# Patient Record
Sex: Male | Born: 1970 | Race: White | Hispanic: No | Marital: Married | Smoking: Never smoker
Health system: Southern US, Community
[De-identification: ages and names within clinical notes are randomized; demographics above are authoritative.]

## PROBLEM LIST (undated history)

## (undated) DIAGNOSIS — S060XAA Concussion with loss of consciousness status unknown, initial encounter: Secondary | ICD-10-CM

## (undated) DIAGNOSIS — E291 Testicular hypofunction: Secondary | ICD-10-CM

## (undated) DIAGNOSIS — Z789 Other specified health status: Secondary | ICD-10-CM

## (undated) DIAGNOSIS — R519 Headache, unspecified: Secondary | ICD-10-CM

## (undated) DIAGNOSIS — N400 Enlarged prostate without lower urinary tract symptoms: Secondary | ICD-10-CM

---

## 2019-12-04 ENCOUNTER — Encounter: Payer: Self-pay | Admitting: Dermatology

## 2019-12-04 ENCOUNTER — Other Ambulatory Visit: Payer: Self-pay

## 2019-12-04 ENCOUNTER — Ambulatory Visit: Payer: BC Managed Care – PPO | Admitting: Dermatology

## 2019-12-04 ENCOUNTER — Encounter (INDEPENDENT_AMBULATORY_CARE_PROVIDER_SITE_OTHER): Payer: Self-pay

## 2019-12-04 DIAGNOSIS — D229 Melanocytic nevi, unspecified: Secondary | ICD-10-CM

## 2019-12-04 DIAGNOSIS — D225 Melanocytic nevi of trunk: Secondary | ICD-10-CM

## 2019-12-04 DIAGNOSIS — D485 Neoplasm of uncertain behavior of skin: Secondary | ICD-10-CM | POA: Diagnosis not present

## 2019-12-04 NOTE — Progress Notes (Signed)
   Follow-Up Visit   Subjective  Christopher Pugh is a 49 y.o. male who presents for the following: Skin Problem (right shoulder scale).  Growth Location: Right upper back Duration: Avril months Quality:  Associated Signs/Symptoms: Modifying Factors:  Severity:  Timing: Context:   The following portions of the chart were reviewed this encounter and updated as appropriate: Tobacco  Allergies  Meds  Problems  Med Hx  Surg Hx  Fam Hx      Objective  Well appearing patient in no apparent distress; mood and affect are within normal limits.  All skin waist up examined.   Assessment & Plan  Neoplasm of uncertain behavior of skin Right Upper Back  Skin / nail biopsy Type of biopsy: tangential   Informed consent: discussed and consent obtained   Timeout: patient name, date of birth, surgical site, and procedure verified   Procedure prep:  Patient was prepped and draped in usual sterile fashion Prep type:  Chlorhexidine Anesthesia: the lesion was anesthetized in a standard fashion   Anesthetic:  1% lidocaine w/ epinephrine 1-100,000 local infiltration Instrument used: flexible razor blade   Hemostasis achieved with: ferric subsulfate   Outcome: patient tolerated procedure well   Post-procedure details: wound care instructions given    Specimen 1 - Surgical pathology Differential Diagnosis: r/o bcc scc Check Margins: No  Nevus Mid Back  Annual skin examination Waist up skin examination for [name], date of birth 15-Jan-1971.  He has noticed new crusty spot on the right back shoulder; this waxy 1 cm spot could be a lichenoid keratosis or a superficial carcinoma.  This was explained in detail and a simple shave biopsy obtained.  He will have an abrasion there for a couple of weeks but there are no restrictions in terms of working out, bathing, or bandaging.  He could check on MyChart in 2 days or call my office to discuss the result.  The remainder of the waist up  examination showed no atypical moles or melanoma.  2 subtle pink barely scaly spots on the central and left upper back are currently nonspecific, but if they are still there when I do my follow-up in the winter we will address whether biopsy or freezing is indicated.  [name] knows he can call me if there are any issues.

## 2019-12-10 ENCOUNTER — Encounter: Payer: Self-pay | Admitting: Dermatology

## 2020-05-17 ENCOUNTER — Ambulatory Visit: Payer: BC Managed Care – PPO | Attending: Internal Medicine

## 2020-05-17 DIAGNOSIS — Z23 Encounter for immunization: Secondary | ICD-10-CM

## 2020-05-17 NOTE — Progress Notes (Signed)
   Covid-19 Vaccination Clinic  Name:  MARE LUDTKE    MRN: [id number] DOB: 1971/04/09  05/17/2020  Mr. Reyez was observed post Covid-19 immunization for 15 minutes without incident. He was provided with Vaccine Information Sheet and instruction to access the V-Safe system.   Mr. Cutbirth was instructed to call 911 with any severe reactions post vaccine: Marland Kitchen Difficulty breathing  . Swelling of face and throat  . A fast heartbeat  . A bad rash all over body  . Dizziness and weakness   Immunizations Administered    Name Date Dose VIS Date Route   Pfizer COVID-19 Vaccine 05/17/2020  2:09 PM 0.3 mL 04/23/2020 Intramuscular   Manufacturer: [place]   Lot: [id number]   [place]: [id number]

## 2021-06-04 HISTORY — PX: ELBOW SURGERY: SHX618

## 2021-06-19 ENCOUNTER — Other Ambulatory Visit: Payer: Self-pay | Admitting: Orthopaedic Surgery

## 2021-10-19 ENCOUNTER — Encounter (HOSPITAL_COMMUNITY)
Admission: EM | Disposition: A | Discharge status: Home / Self Care | Payer: Self-pay | Source: Home / Self Care | Attending: Neurology

## 2021-10-19 ENCOUNTER — Inpatient Hospital Stay (HOSPITAL_COMMUNITY): Payer: BC Managed Care – PPO

## 2021-10-19 ENCOUNTER — Inpatient Hospital Stay (HOSPITAL_COMMUNITY): Payer: BC Managed Care – PPO | Admitting: Certified Registered Nurse Anesthetist

## 2021-10-19 ENCOUNTER — Other Ambulatory Visit: Payer: Self-pay

## 2021-10-19 ENCOUNTER — Encounter (HOSPITAL_COMMUNITY): Payer: Self-pay | Admitting: Emergency Medicine

## 2021-10-19 ENCOUNTER — Inpatient Hospital Stay (HOSPITAL_COMMUNITY)
Admission: EM | Admit: 2021-10-19 | Discharge: 2021-10-21 | DRG: 024 | Disposition: A | Discharge status: Home / Self Care | Payer: BC Managed Care – PPO | Attending: Neurology | Admitting: Neurology

## 2021-10-19 ENCOUNTER — Emergency Department (HOSPITAL_COMMUNITY): Payer: BC Managed Care – PPO

## 2021-10-19 DIAGNOSIS — Z79899 Other long term (current) drug therapy: Secondary | ICD-10-CM | POA: Diagnosis not present

## 2021-10-19 DIAGNOSIS — G4486 Cervicogenic headache: Secondary | ICD-10-CM | POA: Diagnosis not present

## 2021-10-19 DIAGNOSIS — H9319 Tinnitus, unspecified ear: Secondary | ICD-10-CM | POA: Diagnosis present

## 2021-10-19 DIAGNOSIS — R4701 Aphasia: Secondary | ICD-10-CM | POA: Diagnosis present

## 2021-10-19 DIAGNOSIS — R29702 NIHSS score 2: Secondary | ICD-10-CM | POA: Diagnosis present

## 2021-10-19 DIAGNOSIS — Z823 Family history of stroke: Secondary | ICD-10-CM

## 2021-10-19 DIAGNOSIS — N400 Enlarged prostate without lower urinary tract symptoms: Secondary | ICD-10-CM | POA: Diagnosis present

## 2021-10-19 DIAGNOSIS — E291 Testicular hypofunction: Secondary | ICD-10-CM | POA: Diagnosis present

## 2021-10-19 DIAGNOSIS — Z20822 Contact with and (suspected) exposure to covid-19: Secondary | ICD-10-CM | POA: Diagnosis present

## 2021-10-19 DIAGNOSIS — R278 Other lack of coordination: Secondary | ICD-10-CM | POA: Diagnosis present

## 2021-10-19 DIAGNOSIS — F1010 Alcohol abuse, uncomplicated: Secondary | ICD-10-CM | POA: Diagnosis not present

## 2021-10-19 DIAGNOSIS — H5509 Other forms of nystagmus: Secondary | ICD-10-CM | POA: Diagnosis present

## 2021-10-19 DIAGNOSIS — H9190 Unspecified hearing loss, unspecified ear: Secondary | ICD-10-CM | POA: Diagnosis present

## 2021-10-19 DIAGNOSIS — Z87891 Personal history of nicotine dependence: Secondary | ICD-10-CM

## 2021-10-19 DIAGNOSIS — D696 Thrombocytopenia, unspecified: Secondary | ICD-10-CM | POA: Diagnosis present

## 2021-10-19 DIAGNOSIS — E785 Hyperlipidemia, unspecified: Secondary | ICD-10-CM | POA: Diagnosis present

## 2021-10-19 DIAGNOSIS — E7800 Pure hypercholesterolemia, unspecified: Secondary | ICD-10-CM | POA: Diagnosis not present

## 2021-10-19 DIAGNOSIS — I6302 Cerebral infarction due to thrombosis of basilar artery: Secondary | ICD-10-CM | POA: Diagnosis present

## 2021-10-19 DIAGNOSIS — I639 Cerebral infarction, unspecified: Secondary | ICD-10-CM | POA: Diagnosis not present

## 2021-10-19 DIAGNOSIS — I6389 Other cerebral infarction: Secondary | ICD-10-CM

## 2021-10-19 DIAGNOSIS — S12000A Unspecified displaced fracture of first cervical vertebra, initial encounter for closed fracture: Secondary | ICD-10-CM | POA: Diagnosis not present

## 2021-10-19 HISTORY — PX: IR US GUIDE VASC ACCESS RIGHT: IMG2390

## 2021-10-19 HISTORY — DX: Benign prostatic hyperplasia without lower urinary tract symptoms: N40.0

## 2021-10-19 HISTORY — PX: RADIOLOGY WITH ANESTHESIA: SHX6223

## 2021-10-19 HISTORY — DX: Testicular hypofunction: E29.1

## 2021-10-19 HISTORY — PX: IR PERCUTANEOUS ART THROMBECTOMY/INFUSION INTRACRANIAL INC DIAG ANGIO: IMG6087

## 2021-10-19 HISTORY — DX: Cerebral infarction, unspecified: I63.9

## 2021-10-19 HISTORY — PX: IR CT HEAD LTD: IMG2386

## 2021-10-19 LAB — COMPREHENSIVE METABOLIC PANEL
ALT: 23 U/L (ref 0–44)
AST: 27 U/L (ref 15–41)
Albumin: 4 g/dL (ref 3.5–5.0)
Alkaline Phosphatase: 42 U/L (ref 38–126)
Anion gap: 9 (ref 5–15)
BUN: 18 mg/dL (ref 6–20)
CO2: 21 mmol/L — ABNORMAL LOW (ref 22–32)
Calcium: 9.2 mg/dL (ref 8.9–10.3)
Chloride: 106 mmol/L (ref 98–111)
Creatinine, Ser: 1.07 mg/dL (ref 0.61–1.24)
GFR, Estimated: >60 mL/min (ref >60)
Glucose, Bld: 175 mg/dL — ABNORMAL HIGH (ref 70–99)
Potassium: 4.2 mmol/L (ref 3.5–5.1)
Sodium: 136 mmol/L (ref 135–145)
Total Bilirubin: 1.5 mg/dL — ABNORMAL HIGH (ref 0.3–1.2)
Total Protein: 6.3 g/dL — ABNORMAL LOW (ref 6.5–8.1)

## 2021-10-19 LAB — CBC
HCT: 43.3 % (ref 39.0–52.0)
Hemoglobin: 14.8 g/dL (ref 13.0–17.0)
MCH: 32 pg (ref 26.0–34.0)
MCHC: 34.2 g/dL (ref 30.0–36.0)
MCV: 93.7 fL (ref 80.0–100.0)
Platelets: 231 10*3/uL (ref 150–400)
RBC: 4.62 MIL/uL (ref 4.22–5.81)
RDW: 11.9 % (ref 11.5–15.5)
WBC: 8.7 10*3/uL (ref 4.0–10.5)
nRBC: 0 % (ref 0.0–0.2)

## 2021-10-19 LAB — DIFFERENTIAL
Abs Immature Granulocytes: 0.03 10*3/uL (ref 0.00–0.07)
Basophils Absolute: 0 10*3/uL (ref 0.0–0.1)
Basophils Relative: 0 %
Eosinophils Absolute: 0 10*3/uL (ref 0.0–0.5)
Eosinophils Relative: 0 %
Immature Granulocytes: 0 %
Lymphocytes Relative: 8 %
Lymphs Abs: 0.7 10*3/uL (ref 0.7–4.0)
Monocytes Absolute: 0.5 10*3/uL (ref 0.1–1.0)
Monocytes Relative: 5 %
Neutro Abs: 7.5 10*3/uL (ref 1.7–7.7)
Neutrophils Relative %: 87 %

## 2021-10-19 LAB — I-STAT CHEM 8, ED
BUN: 20 mg/dL (ref 6–20)
Calcium, Ion: 1.21 mmol/L (ref 1.15–1.40)
Chloride: 105 mmol/L (ref 98–111)
Creatinine, Ser: 1 mg/dL (ref 0.61–1.24)
Glucose, Bld: 173 mg/dL — ABNORMAL HIGH (ref 70–99)
HCT: 43 % (ref 39.0–52.0)
Hemoglobin: 14.6 g/dL (ref 13.0–17.0)
Potassium: 4.3 mmol/L (ref 3.5–5.1)
Sodium: 138 mmol/L (ref 135–145)
TCO2: 24 mmol/L (ref 22–32)

## 2021-10-19 LAB — LIPID PANEL
Cholesterol: 193 mg/dL (ref 0–200)
HDL: 54 mg/dL (ref >40)
LDL Cholesterol: 132 mg/dL — ABNORMAL HIGH (ref 0–99)
Total CHOL/HDL Ratio: 3.6 RATIO
Triglycerides: 34 mg/dL (ref <150)
VLDL: 7 mg/dL (ref 0–40)

## 2021-10-19 LAB — HIV ANTIBODY (ROUTINE TESTING W REFLEX): HIV Screen 4th Generation wRfx: NONREACTIVE

## 2021-10-19 LAB — ECHOCARDIOGRAM COMPLETE
AR max vel: 3.7 cm2
AV Peak grad: 8.3 mmHg
Ao pk vel: 1.44 m/s
Area-P 1/2: 3.4 cm2
Height: 68 in
S' Lateral: 3.2 cm
Weight: 3040 oz

## 2021-10-19 LAB — HEMOGLOBIN A1C
Hgb A1c MFr Bld: 5.3 % (ref 4.8–5.6)
Mean Plasma Glucose: 105.41 mg/dL

## 2021-10-19 LAB — PROTIME-INR
INR: 1 (ref 0.8–1.2)
Prothrombin Time: 13.4 seconds (ref 11.4–15.2)

## 2021-10-19 LAB — RESP PANEL BY RT-PCR (FLU A&B, COVID) ARPGX2
Influenza A by PCR: NEGATIVE
Influenza B by PCR: NEGATIVE
SARS Coronavirus 2 by RT PCR: NEGATIVE

## 2021-10-19 LAB — MRSA NEXT GEN BY PCR, NASAL: MRSA by PCR Next Gen: NOT DETECTED

## 2021-10-19 LAB — ANTITHROMBIN III: AntiThromb III Func: 100 % (ref 75–120)

## 2021-10-19 LAB — APTT: aPTT: 25 seconds (ref 24–36)

## 2021-10-19 IMAGING — XA IR PERCUTANEOUS ART THORMBECTOMY/INFUSION INTRACRANIAL INCLUDE D
10 of 16 series · 11 of 24 positions shown · IV contrast (IODINE)
Comparison: CT/CT angiogram of the head and neck [DATE]

INDICATION: 51-year-old male with past medical history significant for BPH,
hypogonadism and prior C1 burst fracture presenting with acute onset
of dizziness, ringing in his ears and hearing loss. NIHSS at
presentation was 2 for dysmetria; baseline modified SUJATHA 0.
His last known well was [DATE] p.m. on [DATE] when he head
dizziness SUJATHA that was worse by the morning. Head CT showed
bilateral cerebellar hypodensities concerning for acute/subacute
infarcts when neurology was CT/CT angiogram of the head and neck
showed occlusion at the vertebrobasilar junction. No IV thrombo
lytic given as patient was outside the window the window.

EXAM:
ULTRASOUND-GUIDED VASCULAR [REDACTED] CEREBRAL ANGIOGRAM
MECHANICAL THROMBECTOMY
FLAT PANEL HEAD CT
TECHNIQUE: Informed written consent was obtained from the patient after a
thorough discussion of the procedural risks, benefits and
alternatives. All questions were addressed.

[Series 2: cerebral care 2 · 2 acquisitions, 1 frame shown (1 of 8)]
[im 1/2]
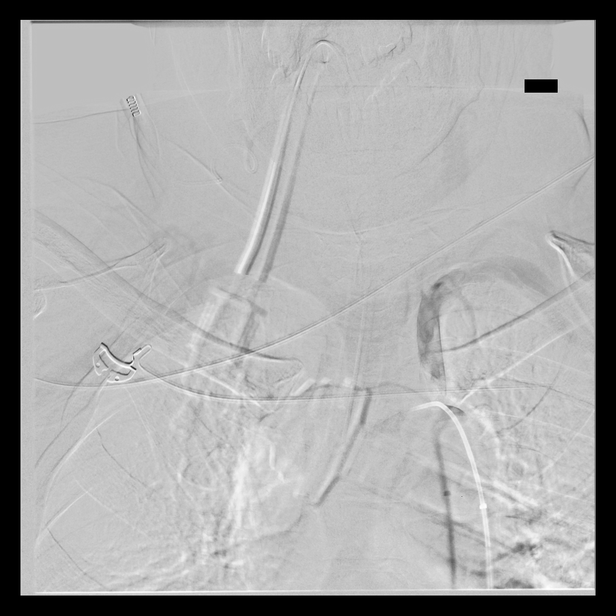

[Series 4: cerebral care 2 · 2 acquisitions, 1 frame shown (2 of 8)]
[im 1/2]
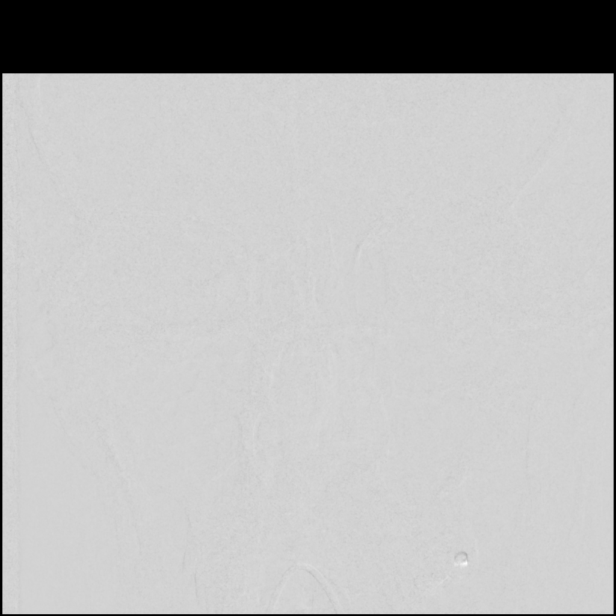

[Series 5: cerebral care 2 · 2 acquisitions, 1 frame shown (3 of 8)]
[im 1/2]
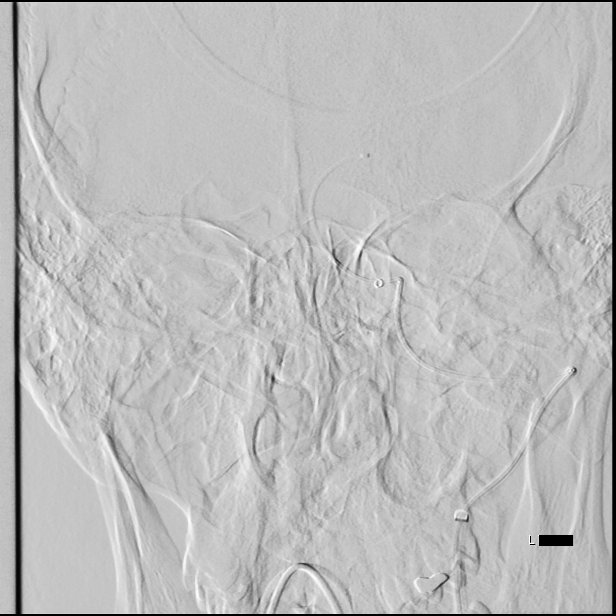

[Series 7: cerebral care 2 · 2 acquisitions, 1 frame shown (4 of 8)]
[im 1/2]
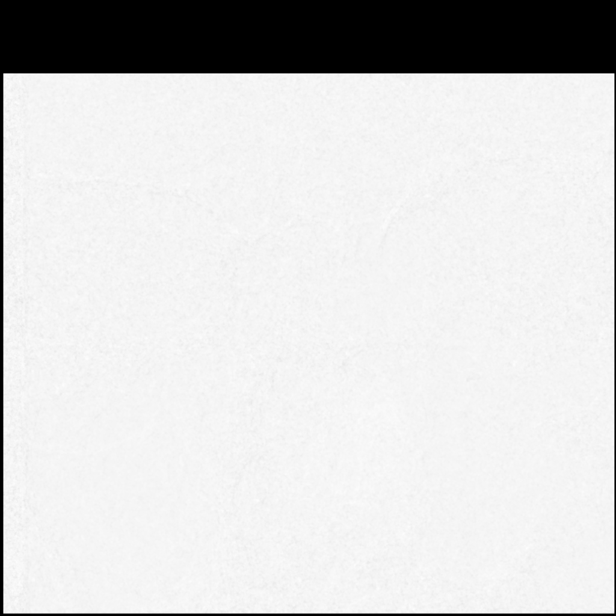

[Series 8: cerebral care 2 · 2 acquisitions, 1 frame shown (5 of 8)]
[im 1/2]
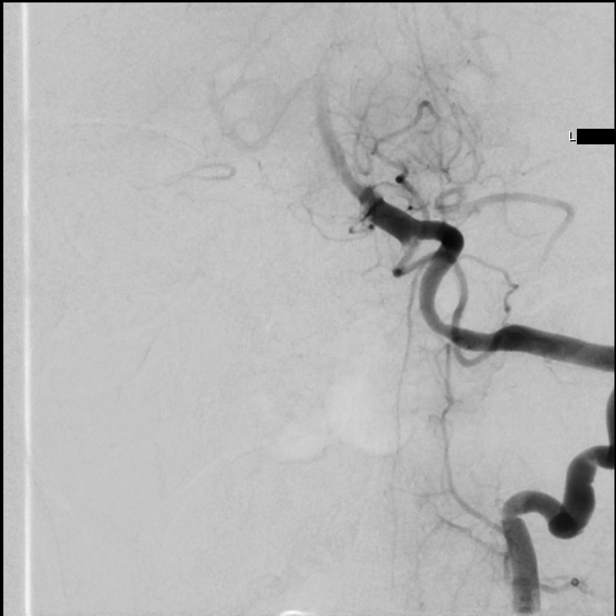

[Series 10: cerebral care 2 · 2 acquisitions, 1 frame shown (6 of 8)]
[im 1/2]
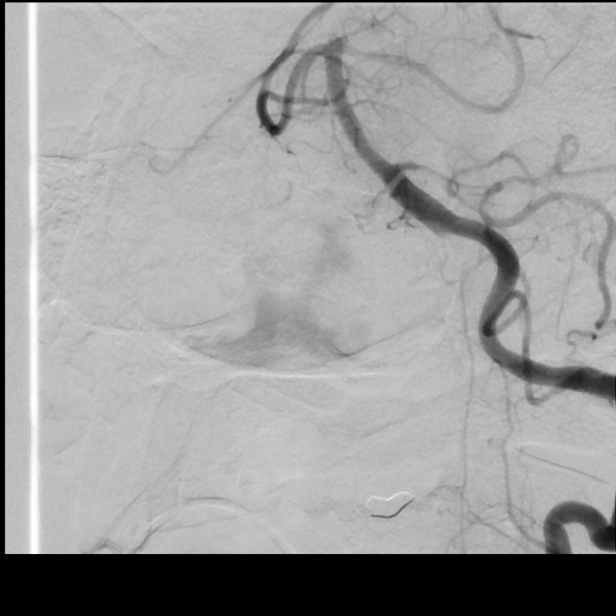

[Series 12: cerebral care 2 · 2 acquisitions, 1 frame shown (7 of 8)]
[im 1/2]
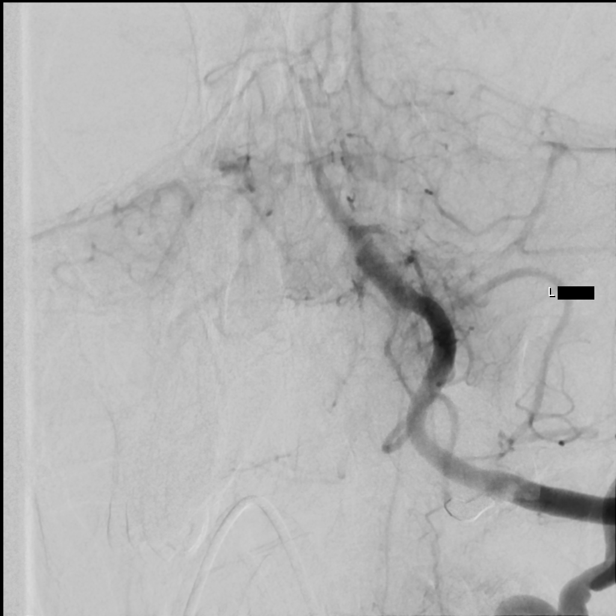

[Series 14: cerebral care 2 · 2 acquisitions, 1 frame shown (8 of 8)]
[im 1/2]
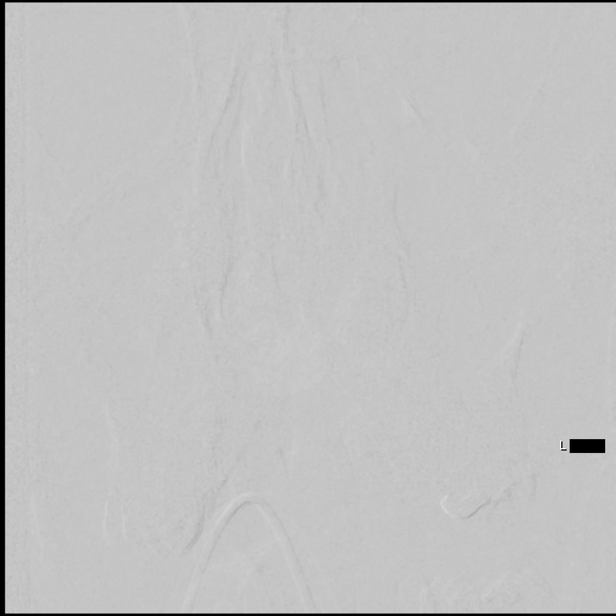

[Series 15: fl neuro n · 2 acquisitions, 1 frame shown]
[im 1/2]
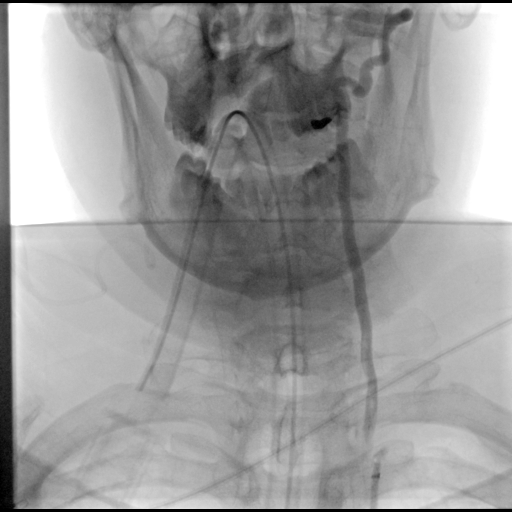

[Series 300: dr. (person_name). · 2 of 52 slices shown]
[im 1/52]
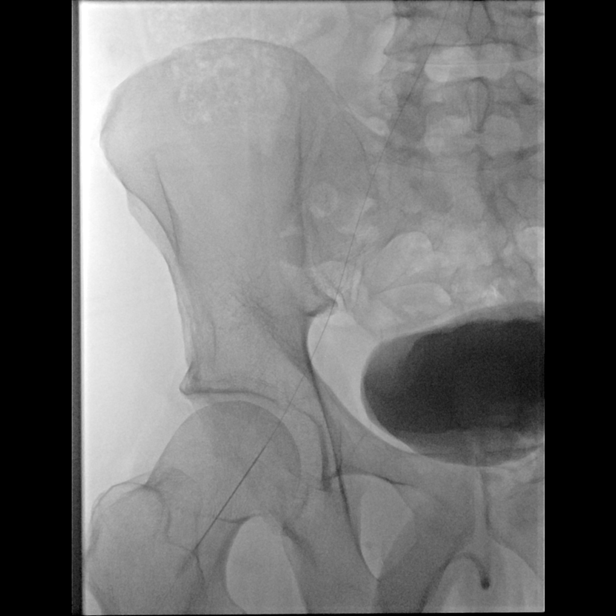
[im 32/52]
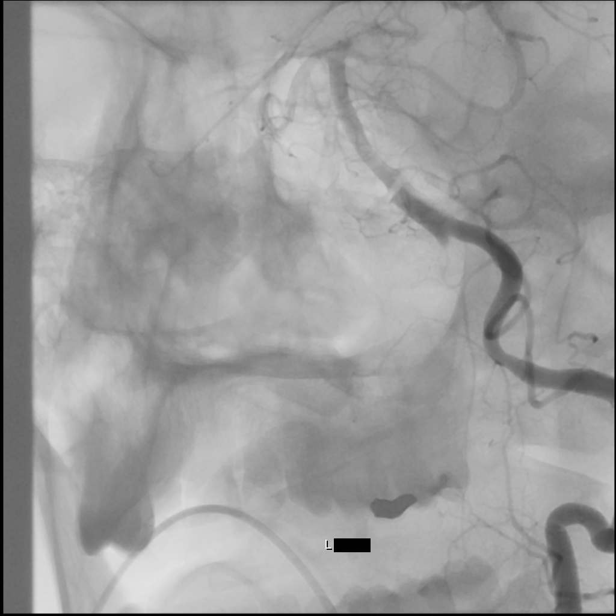

[11 of 24 positions shown; findings below may reference images not displayed]

MEDICATIONS:
No antibiotics administered.

ANESTHESIA/SEDATION:
The procedure was performed under general anesthesia.

CONTRAST:  75 mL of Omnipaque 300 milligram/mL

FLUOROSCOPY:
Radiation Exposure Index (as provided by the fluoroscopic device):
[45] mGy Kerma

COMPLICATIONS:
None immediate.
Maximal Sterile Barrier Technique was utilized including caps, mask,
sterile gowns, sterile gloves, sterile drape, hand hygiene and skin
antiseptic. A timeout was performed prior to the initiation of the
procedure.

The right groin was prepped and draped in the usual sterile fashion.
Using a micropuncture kit and the modified Seldinger technique,
access was gained to the right common femoral artery and an 8 French
sheath was placed. Real-time ultrasound guidance was utilized for
vascular access including the acquisition of a permanent ultrasound
image documenting patency of the accessed vessel.

Under fluoroscopy, a Zoom 88 guide catheter was navigated over a 6
SUJATHA 2 catheter and a 0.035" Terumo Glidewire into the
aortic arch. The catheter was placed into the left subclavian
artery. Frontal and lateral angiograms of the head were obtained.
The catheter was then advanced into the left vertebral artery.
Frontal and lateral angiograms of the head were obtained. The Zoom
88 guide catheter was advanced over the SUJATHA catheter into the
left vertebral artery. The diagnostic catheter was removed. Frontal
and lateral angiograms of the head were obtained.
FINDINGS: 1. Normal caliber of the right common femoral artery, adequate for
vascular access.
2. Patent left vertebral artery with increased tortuosity and
kinking of the distal V2 segment.
3. Occlusion if the proximal basilar artery distal to the
vertebrobasilar junction.
4. Patent V4 segment of the right vertebral artery.

PROCEDURE:
Using biplane roadmap guidance, a zoom 55 aspiration catheter was
navigated over a Prowler Ex microcatheter and a synchro 2
microguidewire into the cavernous segment of the intracranial left
vertebral artery. The microcatheter was then navigated over the wire
into the left superior cerebellar artery. Then, a 5 x 37 mm embotrap
stent retriever was deployed spanning the basilar artery. The device
was allowed to intercalated with the clot for 4 minutes. The
microcatheter was removed. The aspiration catheter was advanced to
the level of occlusion and connected to an aspiration pump. The
thrombectomy device and aspiration catheter were removed under
constant aspiration.

Left vertebral artery angiograms with frontal, lateral and multiple
magnified oblique views of the head showed recanalization of the
basilar artery. Non occlusive clot noted in the bilateral AICA,
extending into the basilar without flow restriction.

Using biplane roadmap, a Zoom 35 aspiration catheter was navigated
over a synchro 2 microguidewire into the basilar artery. The
aspiration catheter was then advanced to the level of occlusion in
the right AICA and connected to an aspiration pump. Continuous
aspiration was performed for 2 minutes. The guide catheter was
connected to a VacLok syringe. The aspiration catheter was
subsequently removed under constant aspiration. The guide catheter
was aspirated for debris.

Left vertebral artery angiograms with frontal and lateral views of
the head showed persistent occlusion of the right AICA with
decreased burden of clot protruding into the basilar artery.

Flat panel CT of the head was obtained and post processed in a
separate workstation with concurrent attending physician
supervision. Selected images were sent to PACS. No evidence of
hemorrhagic complication.

Left vertebral artery angiograms with magnified frontal and lateral
views of the head was obtained and utilized as biplane roadmap.
Using biplane roadmap, a Zoom 35 aspiration catheter was navigated
over a synchro 2 microguidewire into the basilar artery. The
microwire was advanced into the left AICA. However, attempt to
advance the catheter into the left AICA proved unsuccessful with
herniation of the catheter in the basilar artery. The catheter was
subsequently withdrawn.

Left vertebral artery angiogram showed patent basilar artery with
nonocclusive clot in the left AICA with preserved anterograde flow.
Clot seen in the right AICA with minimal delayed flow. Normal flow
noted in the left vertebral artery, basilar artery, superior
cerebellar arteries, posterior cerebral arteries, V3 and V4 segments
of the right vertebral artery.

The guide catheter was subsequently withdrawn under continuous
contrast injection. No evidence of iatrogenic injury to the left
vertebral artery.

Right common femoral artery angiogram was obtained in right anterior
oblique view. The puncture is at the level of the common femoral
artery. The artery has normal caliber, adequate for closure device.
The sheath was exchanged over the wire for a Perclose prostyle which
was utilized for access closure. Immediate hemostasis was achieved.
IMPRESSION: 1. Successful mechanical thrombectomy for treatment of an occlusion
of the proximal basilar artery with complete recanalization (TICI 3)
after 1 pass.
2. Attempted thrombectomy of the right AICA with persistent filling
defect with minimal anterograde flow (TICI 1).
3. No evidence of hemorrhagic complication.

PLAN:
Transfer to ICU.

## 2021-10-19 IMAGING — CT CT ANGIO HEAD-NECK (W OR W/O PERF)
2 of 8 series · 9 of 33 positions shown · IV contrast (OMNI 350)
Comparison: Outside CT of the cervical spine from [DATE].

CLINICAL DATA: Neuro deficit, acute, stroke suspected

EXAM:
CT ANGIOGRAPHY HEAD AND NECK
TECHNIQUE: Multidetector CT imaging of the head and neck was performed using
the standard protocol during bolus administration of intravenous
contrast. Multiplanar CT image reconstructions and MIPs were
obtained to evaluate the vascular anatomy. Carotid stenosis
measurements (when applicable) are obtained utilizing NASCET
criteria, using the distal internal carotid diameter as the
denominator.

[Series 7: vpct neuro 5.0 · axial · 0.41mm/px · z∈[-55,+20]mm · 6 of 860 slices shown]
[im 123/860  soft-tissue]
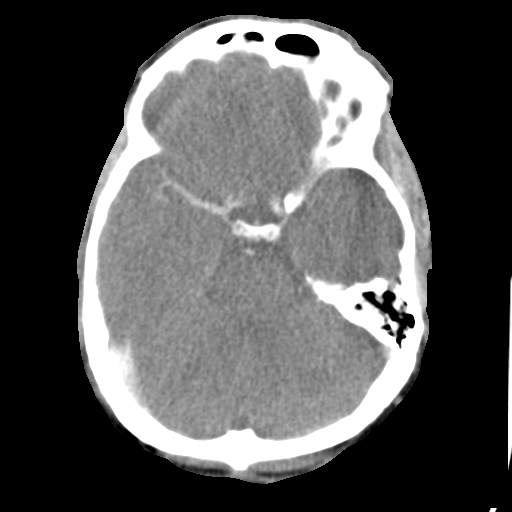
[im 246/860  soft-tissue]
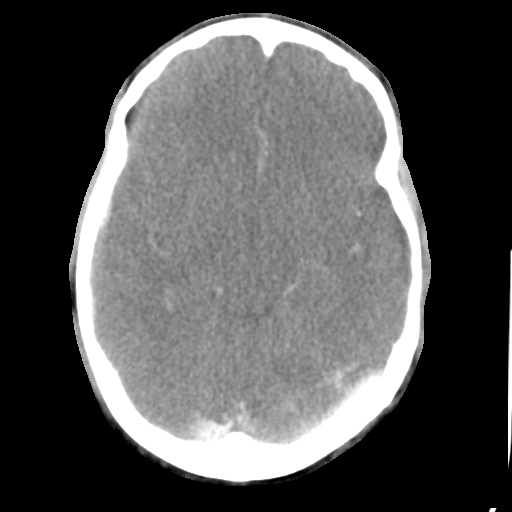
[im 369/860  soft-tissue]
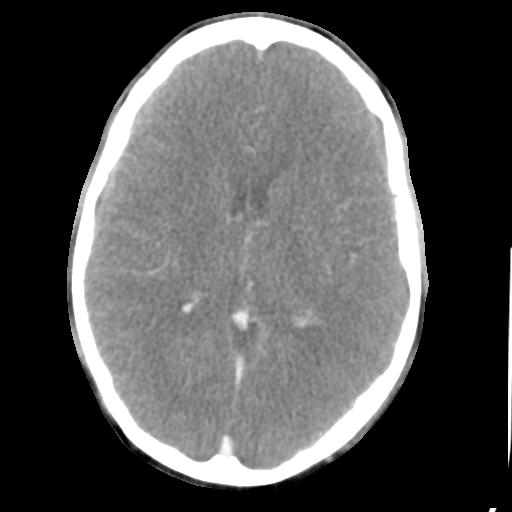
[im 491/860  soft-tissue]
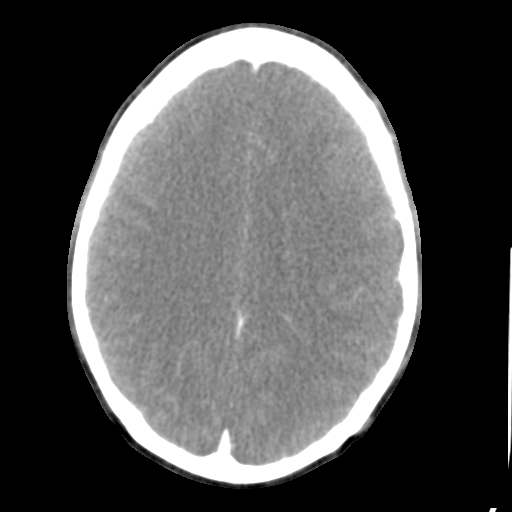
[im 614/860  soft-tissue]
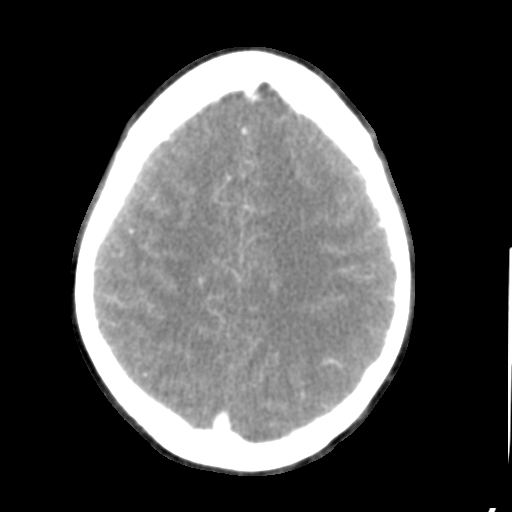
[im 737/860  soft-tissue]
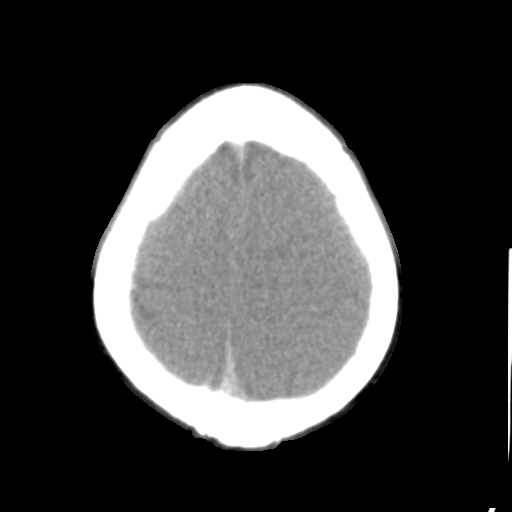

[Series 8: cta neck axial · axial · 0.48mm/px · z∈[-292,+54]mm · 3 of 347 slices shown]
[im 1/347  soft-tissue]
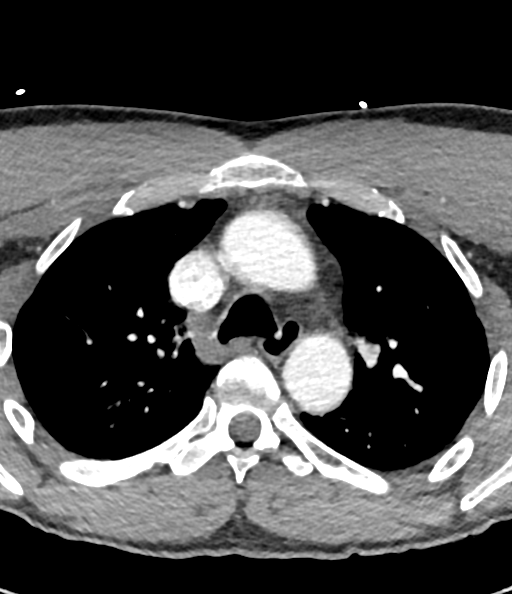
[im 174/347  bone]
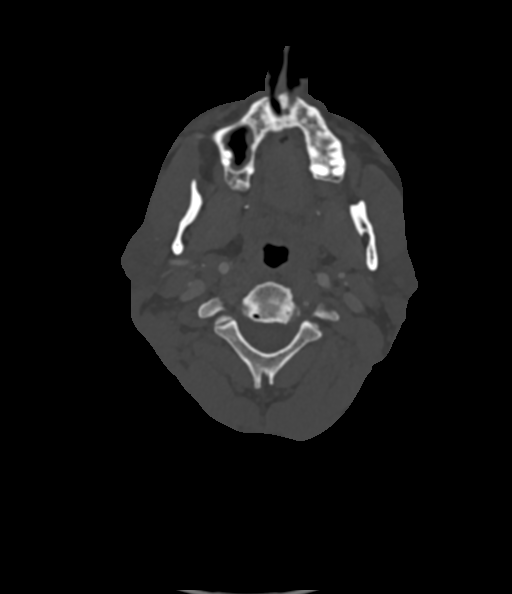
[im 347/347  soft-tissue]
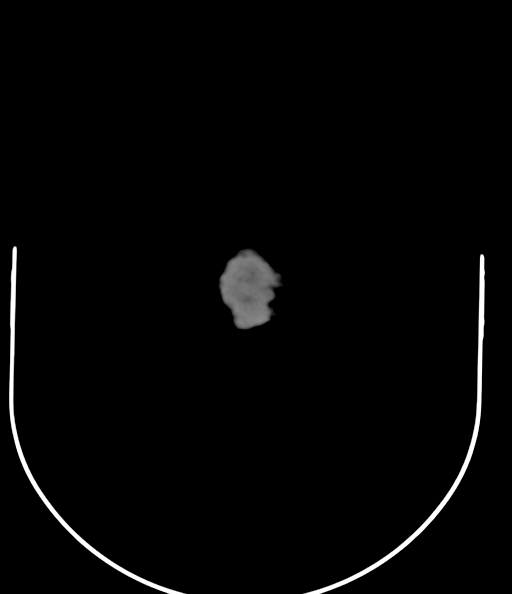

[9 of 33 positions shown; findings below may reference images not displayed]

RADIATION DOSE REDUCTION: This exam was performed according to the
departmental dose-optimization program which includes automated
exposure control, adjustment of the mA and/or kV according to
patient size and/or use of iterative reconstruction technique.

CONTRAST:  100mL OMNIPAQUE IOHEXOL 350 MG/ML SOLN
FINDINGS: CTA NECK FINDINGS

Aortic arch: Great vessel origins are patent without significant
stenosis.

Right carotid system: No evidence of dissection, stenosis (50% or
greater) or occlusion.

Left carotid system: No evidence of dissection, stenosis (50% or
greater) or occlusion.

Vertebral arteries: Small vertebral arteries bilaterally, left
dominant. Left vertebral artery is patent in the neck without
greater than 50% stenosis. Right vertebral artery is non-opacified
at its V3/V4 segment.

Skeleton: Remote C1 burst fracture with fragmentation and
degenerative change at the craniocervical junction. Lateral
displacement of the lateral mass relative to C1 appears similar to
the outside prior from [DATE]. Please see that study for further
characterization. Additional multilevel degenerative disc disease.

Other neck: No acute findings.

Upper chest: Visualized lung apices are clear.

Review of the MIP images confirms the above findings

CTA HEAD FINDINGS

Anterior circulation: Bilateral intracranial ICAs, MCAs, and ACAs
are patent without proximal hemodynamically significant stenosis.
Small right A1 ACA, probably congenital/hypoplastic given prominent
left A1 ACA. No aneurysm identified.

Posterior circulation: Non opacification of the right intradural
vertebral artery. Proximal left intradural vertebral artery is small
with non opacification of the distal left intradural vertebral
artery. The proximal basilar artery is non-opacified. Distal basilar
artery is opacified, potentially related to retrograde flow given
bilateral posterior communicating arteries and small vertebrobasilar
system. Bilateral posterior communicating arteries are patent
without proximal hemodynamically significant stenosis.

Venous sinuses: As permitted by contrast timing, patent.

Anatomic variants: Detailed above.

Review of the MIP images confirms the above findings
IMPRESSION: 1. Non-opacification of the distal right vertebral artery (V2/V3)
and distal left intradural vertebral artery with non-opacification
of the proximal basilar artery. Findings are concerning for
occlusion or high-grade stenosis of these vessels and could relate
to thrombosis and/or dissection. The more distal basilar artery and
bilateral posterior communicating arteries are opacified,
potentially related to retrograde flow from the anterior circulation
given bilateral posterior communicating arteries and small
vertebrobasilar system.
2. Remote C1 burst fracture with fragmentation and degenerative
change at the craniocervical junction. Lateral displacement of the
lateral mass relative to C1 appears similar to the outside prior
from [DATE]. Please see that study for further characterization.

Findings discussed with Dr. HG via telephone at [DATE] a.m.

## 2021-10-19 IMAGING — MR MR HEAD W/O CM
7 of 10 series · 34 of 48 positions shown · non-contrast
Comparison: CT head and CTA head and neck earlier today.
COMPARISON: CT head and CTA head and neck earlier today.

Addendum:
CLINICAL DATA: 51-year-old male with persistent dizziness since
[0T] hours.

EXAM:
MRI HEAD WITHOUT CONTRAST
TECHNIQUE: Multiplanar, multiecho pulse sequences of the brain and surrounding
structures were obtained without intravenous contrast.

[Series 3: DWI · axial · 3.0mm · 1.09mm/px · z∈[-84,+63]mm · 9 of 99 slices shown (1 of 4)]
[im 1/99]
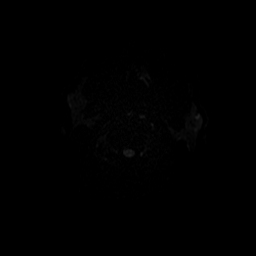
[im 13/99]
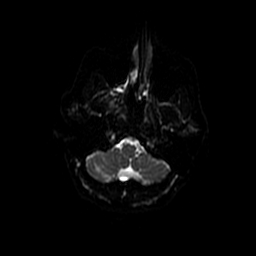
[im 25/99]
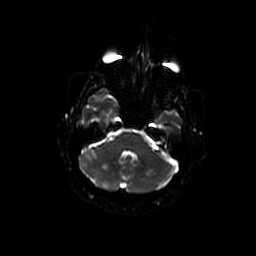
[im 37/99]
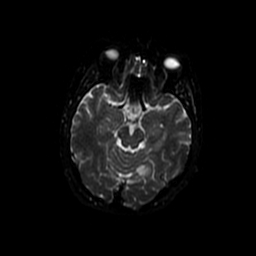
[im 50/99]
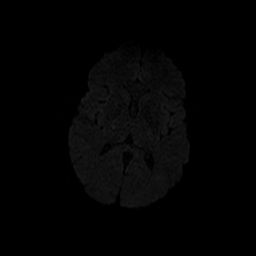
[im 62/99]
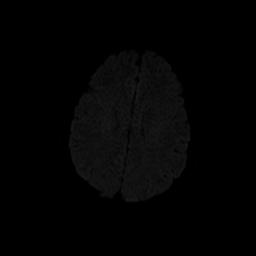
[im 74/99]
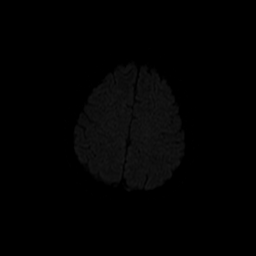
[im 86/99]
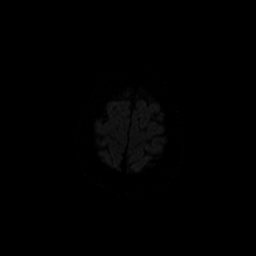
[im 99/99]
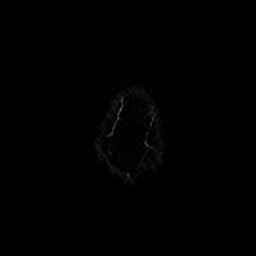

[Series 4: DWI · coronal · 5.0mm · 1.09mm/px · 7 of 76 slices shown (2 of 4)]
[im 1/76]
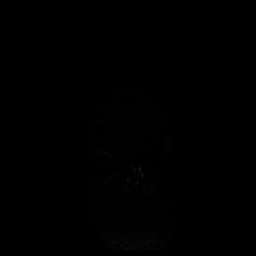
[im 13/76]
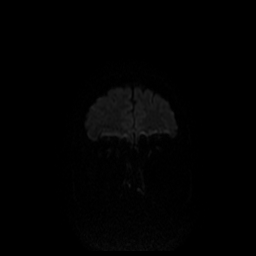
[im 26/76]
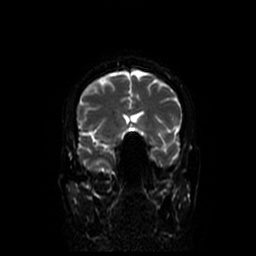
[im 38/76]
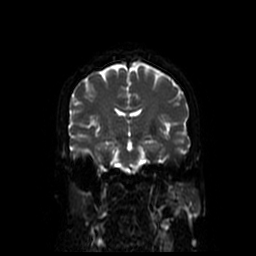
[im 51/76]
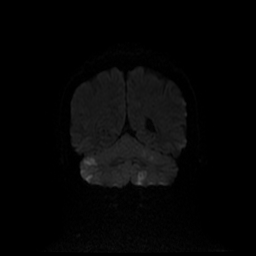
[im 63/76]
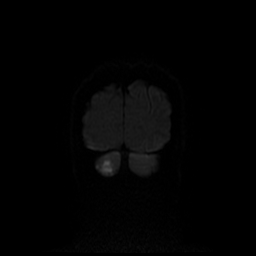
[im 76/76]
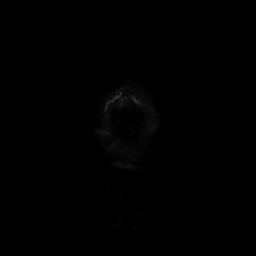

[Series 6: T2 · axial · 5.0mm · 0.43mm/px · z∈[-92,+58]mm · 3 of 26 slices shown (1 of 2)]
[im 1/26]
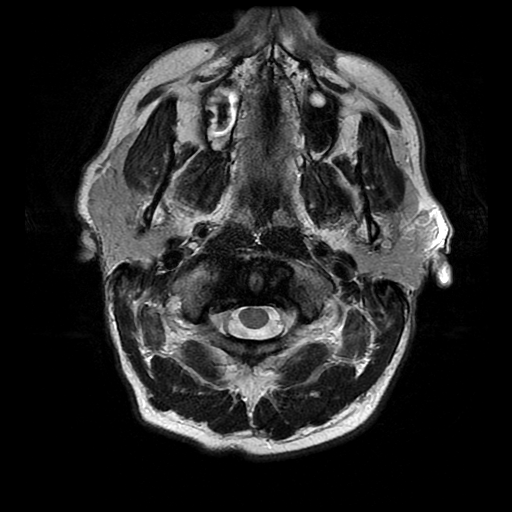
[im 13/26]
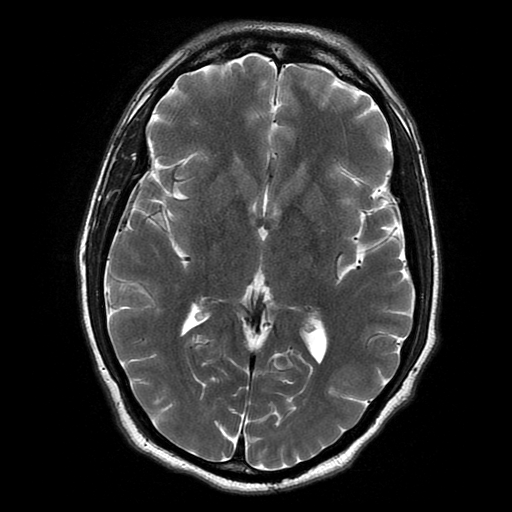
[im 26/26]
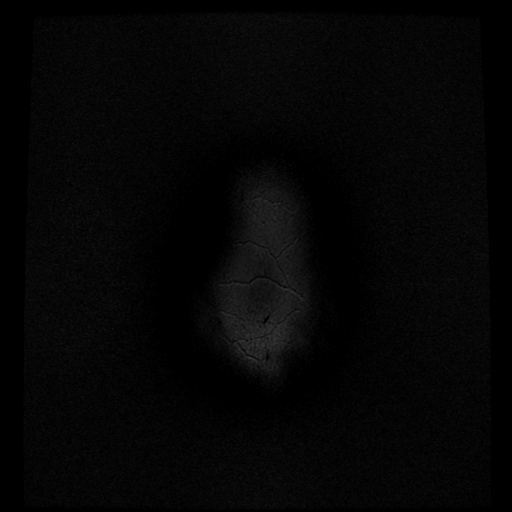

[Series 9: FLAIR · axial · 3.0mm · 0.43mm/px · z∈[-92,+58]mm · 3 of 26 slices shown]
[im 1/26]
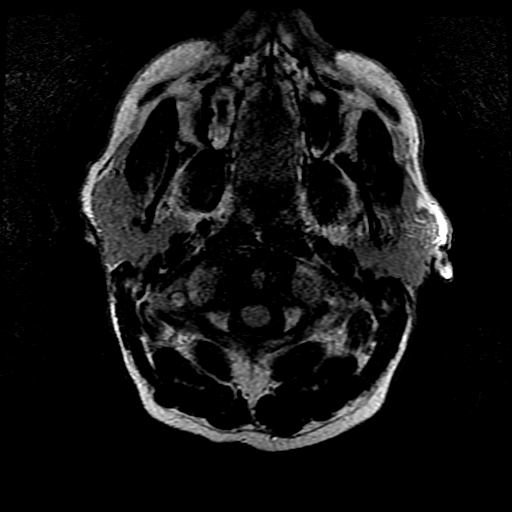
[im 13/26]
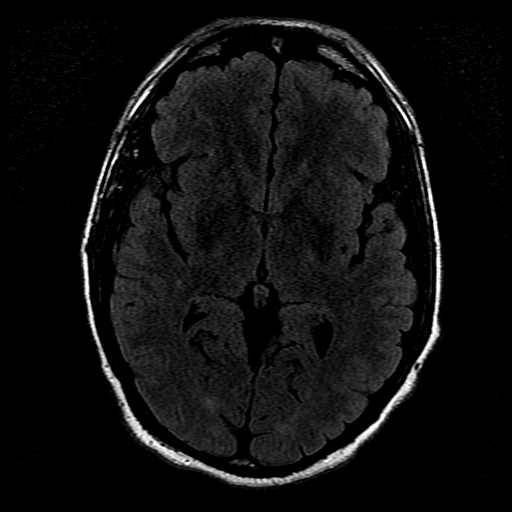
[im 26/26]
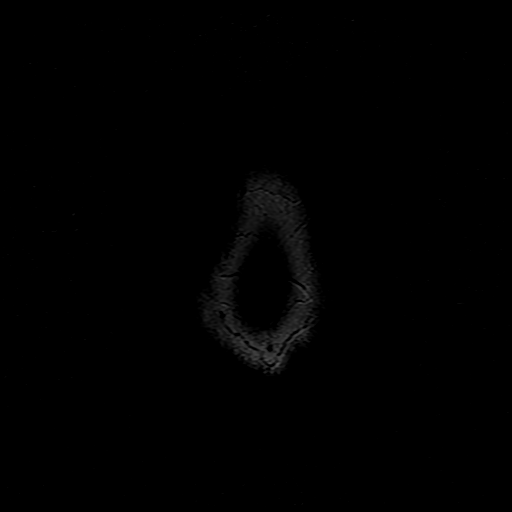

[Series 10: T2 · coronal · 5.0mm · 0.39mm/px · 3 of 30 slices shown (2 of 2)]
[im 1/30]
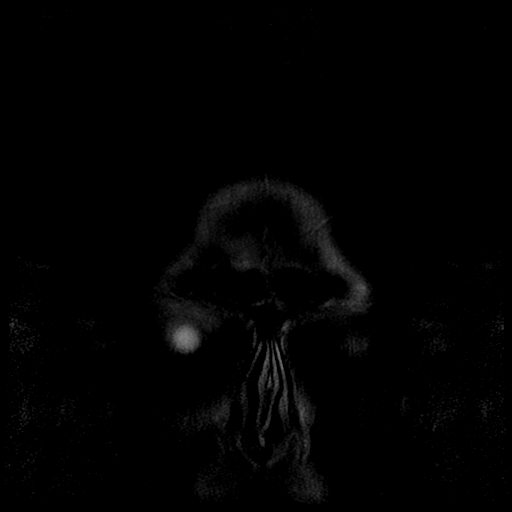
[im 15/30]
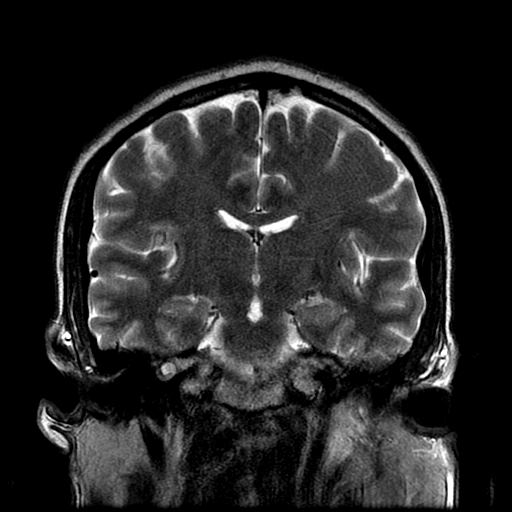
[im 30/30]
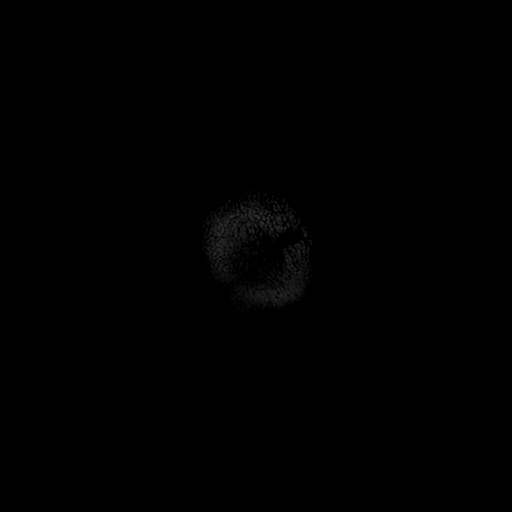

[Series 300: DWI · axial · 3.0mm · 1.09mm/px · z∈[-84,+63]mm · 5 of 50 slices shown (3 of 4)]
[im 1/50]
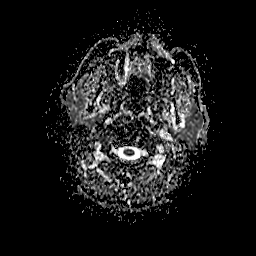
[im 13/50]
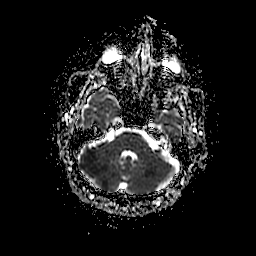
[im 25/50]
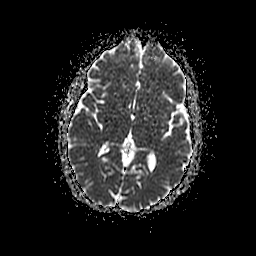
[im 37/50]
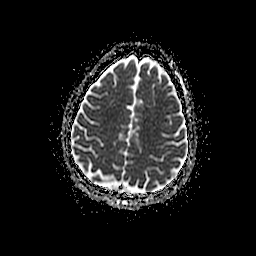
[im 50/50]
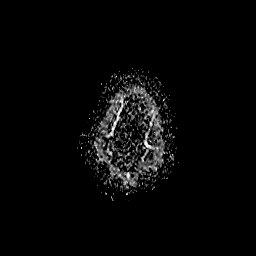

[Series 400: DWI · coronal · 5.0mm · 1.09mm/px · 4 of 38 slices shown (4 of 4)]
[im 1/38]
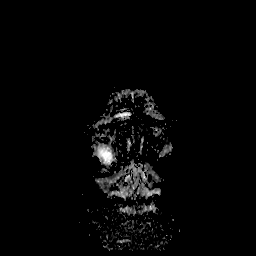
[im 13/38]
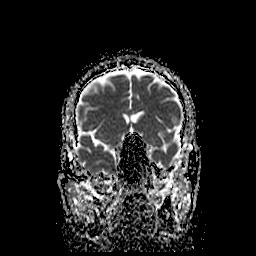
[im 25/38]
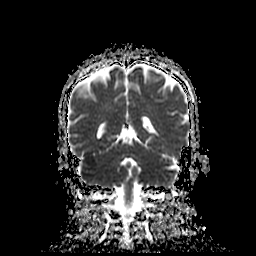
[im 38/38]
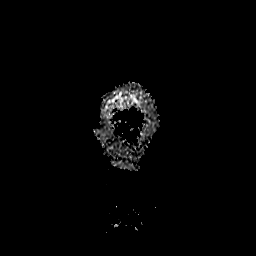

[34 of 48 positions shown; findings below may reference images not displayed]

FINDINGS: Brain: Multifocal confluent bilateral cerebellar infarcts with
restricted diffusion corresponding to the CT hypodensity, and there
is more extensive involvement in the right cerebellar hemisphere
than was evident by CT (PICA and AICA territories series 4, image
11).

No definite brainstem restricted diffusion, T2 or FLAIR
hyperintensity

Cerebellar Cytotoxic edema with no hemorrhage. No posterior fossa
mass effect at this time.

No supratentorial restricted diffusion. No midline shift, mass
effect, evidence of mass lesion, ventriculomegaly, extra-axial
collection or acute intracranial hemorrhage. Cervicomedullary
junction and pituitary are within normal limits. Supratentorial gray
and white matter signal within normal limits. No chronic cerebral
blood products.

Vascular: Abnormal distal vertebral artery and proximal basilar
artery (series 6, image 7) flow voids correlating with abnormal CTA
appearance earlier. Distal basilar flow void, anterior circulation
flow voids are maintained.

Skull and upper cervical spine: Negative. Visualized bone marrow
signal is within normal limits.

Sinuses/Orbits: Postoperative changes to the right globe. Otherwise
negative orbits. Right maxillary sinus fluid and/or mucosal
thickening.

Other: Mastoids are clear. Visible internal auditory structures
appear normal. Negative visible scalp and face.
IMPRESSION: 1. Evidence of poor flow in both distal vertebrals and the proximal
Basilar Artery concordant with earlier CTA.

2. Associated fairly extensive multifocal bilateral cerebellar
infarcts right > left. No associated hemorrhage or posterior fossa
mass effect at this time.

3. No brainstem ischemia detected. Supratentorial brain appears
normal.

ADDENDUM:
Study discussed by telephone with Neurology Dr. LOSADA on [DATE]
at [0T] hours. We discussed that in conjunction with the earlier CTA
it appears the distal Left Vertebral Artery likely congenitally
terminates in PICA. But a relatively normal sized distal Right
Vertebral Artery seems thrombosed on that exam ( "ghost" of that
vessel visible on CTA series 8, image 132).

*** End of Addendum ***
FINDINGS: Brain: Multifocal confluent bilateral cerebellar infarcts with
restricted diffusion corresponding to the CT hypodensity, and there
is more extensive involvement in the right cerebellar hemisphere
than was evident by CT (PICA and AICA territories series 4, image
11).

No definite brainstem restricted diffusion, T2 or FLAIR
hyperintensity

Cerebellar Cytotoxic edema with no hemorrhage. No posterior fossa
mass effect at this time.

No supratentorial restricted diffusion. No midline shift, mass
effect, evidence of mass lesion, ventriculomegaly, extra-axial
collection or acute intracranial hemorrhage. Cervicomedullary
junction and pituitary are within normal limits. Supratentorial gray
and white matter signal within normal limits. No chronic cerebral
blood products.

Vascular: Abnormal distal vertebral artery and proximal basilar
artery (series 6, image 7) flow voids correlating with abnormal CTA
appearance earlier. Distal basilar flow void, anterior circulation
flow voids are maintained.

Skull and upper cervical spine: Negative. Visualized bone marrow
signal is within normal limits.

Sinuses/Orbits: Postoperative changes to the right globe. Otherwise
negative orbits. Right maxillary sinus fluid and/or mucosal
thickening.

Other: Mastoids are clear. Visible internal auditory structures
appear normal. Negative visible scalp and face.
IMPRESSION: 1. Evidence of poor flow in both distal vertebrals and the proximal
Basilar Artery concordant with earlier CTA.

2. Associated fairly extensive multifocal bilateral cerebellar
infarcts right > left. No associated hemorrhage or posterior fossa
mass effect at this time.

3. No brainstem ischemia detected. Supratentorial brain appears
normal.

## 2021-10-19 SURGERY — IR WITH ANESTHESIA
Anesthesia: General

## 2021-10-19 MED ORDER — ONDANSETRON 4 MG PO TBDP
4.0000 mg | ORAL_TABLET | Freq: Once | ORAL | Status: DC
Start: 2021-10-19 — End: 2021-10-19

## 2021-10-19 MED ORDER — ACETAMINOPHEN 160 MG/5ML PO SOLN: 650.0000 mg | ORAL | Status: DC | PRN

## 2021-10-19 MED ORDER — FENTANYL CITRATE (PF) 100 MCG/2ML IJ SOLN
INTRAMUSCULAR | Status: AC
  Filled 2021-10-19: qty 2

## 2021-10-19 MED ORDER — SODIUM CHLORIDE 0.9% FLUSH: 3.0000 mL | Freq: Once | INTRAVENOUS | Status: DC

## 2021-10-19 MED ORDER — ACETAMINOPHEN 325 MG PO TABS
650.0000 mg | ORAL_TABLET | ORAL | Status: DC | PRN
  Administered 2021-10-19 – 2021-10-21 (×9): 650 mg via ORAL
  Filled 2021-10-19 (×10): qty 2

## 2021-10-19 MED ORDER — ACETAMINOPHEN 325 MG PO TABS: 650.0000 mg | ORAL_TABLET | ORAL | Status: DC | PRN

## 2021-10-19 MED ORDER — ACETAMINOPHEN 650 MG RE SUPP: 650.0000 mg | RECTAL | Status: DC | PRN

## 2021-10-19 MED ORDER — MECLIZINE HCL 25 MG PO TABS: 25.0000 mg | ORAL_TABLET | Freq: Once | ORAL | Status: DC

## 2021-10-19 MED ORDER — ENOXAPARIN SODIUM 40 MG/0.4ML IJ SOSY
40.0000 mg | PREFILLED_SYRINGE | INTRAMUSCULAR | Status: DC
  Administered 2021-10-19 – 2021-10-20 (×2): 40 mg via SUBCUTANEOUS
  Filled 2021-10-19 (×2): qty 0.4

## 2021-10-19 MED ORDER — ONDANSETRON HCL 4 MG/2ML IJ SOLN: 4.0000 mg | Freq: Four times a day (QID) | INTRAMUSCULAR | Status: DC | PRN

## 2021-10-19 MED ORDER — MECLIZINE HCL 25 MG PO TABS
25.0000 mg | ORAL_TABLET | Freq: Two times a day (BID) | ORAL | Status: DC
  Administered 2021-10-19 – 2021-10-21 (×5): 25 mg via ORAL
  Filled 2021-10-19 (×6): qty 1

## 2021-10-19 MED ORDER — CLOPIDOGREL BISULFATE 300 MG PO TABS
300.0000 mg | ORAL_TABLET | Freq: Once | ORAL | Status: AC
Start: 2021-10-19 — End: 2021-10-19
  Administered 2021-10-19: 300 mg via ORAL
  Filled 2021-10-19: qty 1

## 2021-10-19 MED ORDER — ACETAMINOPHEN 160 MG/5ML PO SOLN
650.0000 mg | ORAL | Status: DC | PRN
Start: 2021-10-19 — End: 2021-10-21

## 2021-10-19 MED ORDER — PROPOFOL 10 MG/ML IV BOLUS: INTRAVENOUS | Status: DC | PRN

## 2021-10-19 MED ORDER — IOHEXOL 350 MG/ML SOLN
100.0000 mL | Freq: Once | INTRAVENOUS | Status: AC | PRN
  Administered 2021-10-19: 100 mL via INTRAVENOUS

## 2021-10-19 MED ORDER — ONDANSETRON HCL 4 MG/2ML IJ SOLN
INTRAMUSCULAR | Status: DC | PRN
  Administered 2021-10-19: 4 mg via INTRAVENOUS

## 2021-10-19 MED ORDER — SUGAMMADEX SODIUM 200 MG/2ML IV SOLN
INTRAVENOUS | Status: DC | PRN
Start: 2021-10-19 — End: 2021-10-19
  Administered 2021-10-19 (×2): 200 mg via INTRAVENOUS

## 2021-10-19 MED ORDER — ROCURONIUM BROMIDE 10 MG/ML (PF) SYRINGE
PREFILLED_SYRINGE | INTRAVENOUS | Status: DC | PRN
  Administered 2021-10-19: 80 mg via INTRAVENOUS
  Administered 2021-10-19: 40 mg via INTRAVENOUS
  Administered 2021-10-19: 20 mg via INTRAVENOUS

## 2021-10-19 MED ORDER — SENNOSIDES-DOCUSATE SODIUM 8.6-50 MG PO TABS: 1.0000 | ORAL_TABLET | Freq: Every evening | ORAL | Status: DC | PRN

## 2021-10-19 MED ORDER — SODIUM CHLORIDE 0.9 % IV BOLUS
1000.0000 mL | Freq: Once | INTRAVENOUS | Status: DC
Start: 2021-10-19 — End: 2021-10-19

## 2021-10-19 MED ORDER — ASPIRIN 325 MG PO TABS
650.0000 mg | ORAL_TABLET | Freq: Once | ORAL | Status: AC
  Administered 2021-10-19: 650 mg via ORAL
  Filled 2021-10-19: qty 2

## 2021-10-19 MED ORDER — FENTANYL CITRATE (PF) 100 MCG/2ML IJ SOLN
INTRAMUSCULAR | Status: DC | PRN
  Administered 2021-10-19 (×2): 100 ug via INTRAVENOUS

## 2021-10-19 MED ORDER — SODIUM CHLORIDE 0.9 % IV SOLN: INTRAVENOUS | Status: DC | PRN

## 2021-10-19 MED ORDER — VERAPAMIL HCL 2.5 MG/ML IV SOLN
INTRAVENOUS | Status: AC | PRN
  Administered 2021-10-19: 5 mg via INTRA_ARTERIAL

## 2021-10-19 MED ORDER — VERAPAMIL HCL 2.5 MG/ML IV SOLN
INTRAVENOUS | Status: AC
  Filled 2021-10-19: qty 2

## 2021-10-19 MED ORDER — ONDANSETRON HCL 4 MG/2ML IJ SOLN
4.0000 mg | Freq: Four times a day (QID) | INTRAMUSCULAR | Status: DC | PRN
  Administered 2021-10-19: 4 mg via INTRAVENOUS
  Filled 2021-10-19: qty 2

## 2021-10-19 MED ORDER — PHENYLEPHRINE HCL-NACL 20-0.9 MG/250ML-% IV SOLN
INTRAVENOUS | Status: DC | PRN
  Administered 2021-10-19: 30 ug/min via INTRAVENOUS

## 2021-10-19 MED ORDER — DIAZEPAM 5 MG/ML IJ SOLN: 5.0000 mg | Freq: Four times a day (QID) | INTRAMUSCULAR | Status: DC | PRN

## 2021-10-19 MED ORDER — STROKE: EARLY STAGES OF RECOVERY BOOK
Freq: Once | Status: AC
  Filled 2021-10-19: qty 1

## 2021-10-19 MED ORDER — LIDOCAINE 2% (20 MG/ML) 5 ML SYRINGE
INTRAMUSCULAR | Status: DC | PRN
  Administered 2021-10-19: 60 mg via INTRAVENOUS

## 2021-10-19 MED ORDER — LACTATED RINGERS IV SOLN: INTRAVENOUS | Status: DC | PRN

## 2021-10-19 MED ORDER — CHLORHEXIDINE GLUCONATE CLOTH 2 % EX PADS
6.0000 | MEDICATED_PAD | Freq: Every day | CUTANEOUS | Status: DC
  Administered 2021-10-20 – 2021-10-21 (×2): 6 via TOPICAL

## 2021-10-19 MED ORDER — SUCCINYLCHOLINE CHLORIDE 200 MG/10ML IV SOSY
PREFILLED_SYRINGE | INTRAVENOUS | Status: DC | PRN
  Administered 2021-10-19: 80 mg via INTRAVENOUS
  Administered 2021-10-19: 20 mg via INTRAVENOUS

## 2021-10-19 MED ORDER — ONDANSETRON HCL 4 MG/2ML IJ SOLN
INTRAMUSCULAR | Status: AC
  Administered 2021-10-19: 4 mg
  Filled 2021-10-19: qty 2

## 2021-10-19 MED ORDER — PHENYLEPHRINE 40 MCG/ML (10ML) SYRINGE FOR IV PUSH (FOR BLOOD PRESSURE SUPPORT)
PREFILLED_SYRINGE | INTRAVENOUS | Status: DC | PRN
  Administered 2021-10-19 (×2): 80 ug via INTRAVENOUS
  Administered 2021-10-19: 120 ug via INTRAVENOUS
  Administered 2021-10-19: 40 ug via INTRAVENOUS
  Administered 2021-10-19: 80 ug via INTRAVENOUS

## 2021-10-19 MED ORDER — EPHEDRINE SULFATE-NACL 50-0.9 MG/10ML-% IV SOSY
PREFILLED_SYRINGE | INTRAVENOUS | Status: DC | PRN
  Administered 2021-10-19: 5 mg via INTRAVENOUS

## 2021-10-19 MED ORDER — CLEVIDIPINE BUTYRATE 0.5 MG/ML IV EMUL: 0.0000 mg/h | INTRAVENOUS | Status: DC

## 2021-10-19 NOTE — Consult Note (Addendum)
Neurology Consultation ? ?Reason for Consult: dizziness, hearing loss with ringing ?Referring Physician: Dr Reather Converse ? ?CC: Dizziness, hearing loss, ringing ? ?History is obtained from: Patient, chart, wife ? ?HPI: Christopher Pugh is a 51 y.o. male no significant past medical history, presenting to the emergency room from home for complaints of dizziness and ringing in his ears and hearing loss. ?Reports last known well at 10:45 PM yesterday when he had a sudden onset of a spell of dizziness which was severe enough to make him to sit down in the hallway in his house where he was walking.  Symptoms did not improve and actually got worse this morning prompting his wife to bring him to the emergency room.  Code stroke was activated in the emergency room. ?Head CT obtained in the ED-none code stroke protocol-revealed bilateral cerebellar hypodensities concerning for acute/subacute infarcts. ?Neurology was consulted for the above clinical and imaging findings. ?Patient reports no history of smoking.  Reports that he maintains a very healthy lifestyle and does CrossFit multiple times a week, weightlifting and running. ?He is an Journalist, newspaper at a local high school. ? ?Of note, has a prior history of C1 burst fracture in the past. ? ?LKW: 1045 hrs ?tpa given?: no, outside window ?Premorbid modified Rankin scale (mRS): 0 ? ? ?ROS: Full ROS was performed and is negative except as noted in the HPI.  ? ?Past Medical History:  ?Diagnosis Date  ? Prostate disorder   ? ?No family history on file. ? ?Social History:  ? reports that he has never smoked. He has quit using smokeless tobacco. He reports current alcohol use. He reports that he does not use drugs. ? ?Medications ? ?Current Facility-Administered Medications:  ?  ondansetron (ZOFRAN-ODT) disintegrating tablet 4 mg, 4 mg, Oral, Once, Prosperi, Christian H, PA-C ?  sodium chloride 0.9 % bolus 1,000 mL, 1,000 mL, Intravenous, Once, Prosperi, Christian H, PA-C ?  sodium  chloride flush (NS) 0.9 % injection 3 mL, 3 mL, Intravenous, Once, Elnora Morrison, MD ? ?Current Outpatient Medications:  ?  Cholecalciferol (D3 PO), Take 1 capsule by mouth daily., Disp: , Rfl:  ?  ibuprofen (ADVIL) 200 MG tablet, Take 600 mg by mouth every 6 (six) hours as needed for headache or mild pain., Disp: , Rfl:  ?  Multiple Vitamin (MULTIVITAMIN) tablet, Take 1 tablet by mouth daily., Disp: , Rfl:  ?  tadalafil (CIALIS) 5 MG tablet, Take 5 mg by mouth daily., Disp: , Rfl:  ?  testosterone cypionate (DEPOTESTOSTERONE CYPIONATE) 200 MG/ML injection, Inject 200 mg into the muscle every 14 (fourteen) days., Disp: , Rfl:  ? ? ?Exam: ?Current vital signs: ?BP 125/81   Pulse 67   Temp (!) 97.3 ?F (36.3 ?C) (Oral)   Resp 19   SpO2 100%  ?Vital signs in last 24 hours: ?Temp:  [97.3 ?F (36.3 ?C)] 97.3 ?F (36.3 ?C) (04/17 1610) ?Pulse Rate:  [67] 67 (04/17 0830) ?Resp:  [16-19] 19 (04/17 0830) ?BP: (125-138)/(81-94) 125/81 (04/17 0830) ?SpO2:  [100 %] 100 % (04/17 0830) ?General: Patient is awake alert in some distress due to nausea and dizziness ?HEENT: Normocephalic atraumatic ?Lungs: Clear ?Cardiovascular: Regular rhythm ?Abdomen nondistended nontender ?Extremities warm well perfused ?Neurological exam ?Awake alert oriented x3 ?No dysarthria ?No aphasia ?Cranial nerves: Pupils equal round react light, extraocular movement examination reveals no restriction but he has bilateral end gaze nystagmus which is not fatigable.  He also has nystagmus at rest which is rotatory nature.  Facial sensation  intact, face symmetric.  Reports diminished auditory acuity bilaterally and reports hearing everybody else's speech as "cartoon character talk".  Tongue and palate midline. ?Motor examination with no drift or weakness ?Sensation intact to light touch ?Coordination: Dysmetria worse on the left in comparison to the right. ?Gait testing deferred ?NIH stroke scale-2 for dysmetria ? ? ?Labs ?I have reviewed labs in epic and  the results pertinent to this consultation are: ? ? ?CBC ?   ?Component Value Date/Time  ? WBC 8.7 10/19/2021 0740  ? RBC 4.62 10/19/2021 0740  ? HGB 14.6 10/19/2021 0751  ? HCT 43.0 10/19/2021 0751  ? PLT 231 10/19/2021 0740  ? MCV 93.7 10/19/2021 0740  ? MCH 32.0 10/19/2021 0740  ? MCHC 34.2 10/19/2021 0740  ? RDW 11.9 10/19/2021 0740  ? LYMPHSABS 0.7 10/19/2021 0740  ? MONOABS 0.5 10/19/2021 0740  ? EOSABS 0.0 10/19/2021 0740  ? BASOSABS 0.0 10/19/2021 0740  ? ? ?CMP  ?   ?Component Value Date/Time  ? NA 138 10/19/2021 0751  ? K 4.3 10/19/2021 0751  ? CL 105 10/19/2021 0751  ? CO2 21 (L) 10/19/2021 0740  ? GLUCOSE 173 (H) 10/19/2021 0751  ? BUN 20 10/19/2021 0751  ? CREATININE 1.00 10/19/2021 0751  ? CALCIUM 9.2 10/19/2021 0740  ? PROT 6.3 (L) 10/19/2021 0740  ? ALBUMIN 4.0 10/19/2021 0740  ? AST 27 10/19/2021 0740  ? ALT 23 10/19/2021 0740  ? ALKPHOS 42 10/19/2021 0740  ? BILITOT 1.5 (H) 10/19/2021 0740  ? GFRNONAA >60 10/19/2021 0740  ? ? ?Imaging ?I have reviewed the images obtained: ? ?CT-head: Bilateral cerebellar hypodensities concerning for ischemia.  No bleed. ?CT angiography of the head and neck with nonopacification of the distal right vert V2 V3 and distal left intradural vertebral artery segment with nonopacification of the proximal basilar.  Findings concerning for occlusion/high-grade stenosis or could be related to thrombosis and/or dissection.  More distal basilar artery and bilateral posterior communicating arteries are opacified potentially related to retrograde flow from the anterior circulation given bilateral P-comm's and small/hypoplastic vertebrobasilar system.  Remote C1 burst fracture with fragmentation and degenerative change at the craniocervical junction unchanged from prior outside study of 04/01/2020. ?CT perfusion-no perfusion deficit ? ? ?Assessment: ?51 year old with no significant past medical history presenting for evaluation of sudden onset of dizziness, ringing and hearing loss  in both ears along with nausea and vomiting.  Clinical suspicion for AICA stroke. ?CT head with bilateral cerebellar hypodensities concerning for acute/subacute ischemia.  CT angiography head and neck shows occlusion of the right vertebral artery, possible distal occlusion of the left vertebral artery, generally hypoplastic posterior circulation and vertebrobasilar system as well as remote C1 burst fracture. ?Strokes are likely related to the vertebral artery occlusion.  Proximal basilar looks occluded but distal basilar is open and patent.  NIH stroke scale is 2 for dysmetria localizing only to the cerebellum. ?Stat MRI brain reveals acute strokes in bilateral cerebellar in the PICA and possibly SCA territory and questionably on the left ACA territory as well. ?Case discussed with neuro interventional radiology-no emergent intervention at this time given patent basilar, hypoplastic vertebral artery with possible difficult access and low disability. ? 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 ? ?Recommendations: ?Admit to hospitalist-progressive ?Telemetry ?Frequent neurochecks ?Loaded with dual antiplatelets: Aspirin 650 mg plus Plavix 300 mg today. ?Continue aspirin 325 and Plavix 75 from tomorrow ?High intensity statin ?Check 2D echocardiogram ?Check A1c and  lipid panel ?Allow for permissive hypertension-treat only if systolic blood pressures greater than 220 on a as needed basis.  Start normalizing blood pressures in about 48 to 72 hours. ?PT ?OT ?Speech therapy ?Plan discussed with Dr. Reather Converse and the patient's wife and patient at bedside. ?Stroke neurology will continue to follow with you. ? ?Addendum-1201 hrs ?Further discussions with neuroradiology and neuro interventional radiology on MRI and CT findings.   ?MRI brain with evidence of poor flow in both distal vertebrals and proximal basilar artery with associated fairly  extensive multifocal bilateral right greater than left cerebellar infarctions with no hemorrhage.  No brainstem ischemia.  No ischemia in the supratentorial brain.  In conjunction with the CTA earlier, it does look

## 2021-10-19 NOTE — Code Documentation (Signed)
Code IR activated after Dr. Rory Percy had further discussions with radiology. Met patient in ED and assisted in taking to IR. Consent signed. Patient prepped. ICU aware of pending admit to unit post procedure.  ?

## 2021-10-19 NOTE — Sedation Documentation (Signed)
Patient transported to [facility]. Hannah RN at the bedside to receive patient. Groin site intact. Soft to palpation, no hematoma noted. +2 distal pulses intact.  ?

## 2021-10-19 NOTE — ED Provider Notes (Signed)
?[facility] ?Provider Note ? ? ?CSN: [id number] ?Arrival date & time: 10/19/21  9833 ? ?An emergency department physician performed an initial assessment on this suspected stroke patient at [phone]. ? ?History ? ?Chief Complaint  ?Patient presents with  ? Dizziness  ? Aphasia  ? ? ?Christopher Pugh is a 51 y.o. male with significant past medical history who presents with concern for vertigo, dizziness, ear ringing, slurred speech that began around 11 PM last night.  Patient fell asleep on the couch got up this morning had endorsed some right arm numbness which is since resolved.  Wife reports that his speech is slurred compared to normal, reports that has been sounds a little drunk.  Patient also endorsed some blurry vision without vision loss.  No history of recent head trauma, injury, questionable previous head trauma from 2021 with possible unstable cervical spine fracture which is not further evaluated after 2021.  He denies any recent tick bites or insect bites.  Wife reports that everyone in the family had some kind of a cold, cough for the last few days. ? ? ?Dizziness ?Associated symptoms: tinnitus   ? ?  ? ?Home Medications ?Prior to Admission medications   ?Medication Sig Start Date End Date Taking? Authorizing Provider  ?Cholecalciferol (D3 PO) Take 1 capsule by mouth daily.   Yes [provider]  ?ibuprofen (ADVIL) 200 MG tablet Take 600 mg by mouth every 6 (six) hours as needed for headache or mild pain.   Yes [provider]  ?Multiple Vitamin (MULTIVITAMIN) tablet Take 1 tablet by mouth daily.   Yes [provider]  ?tadalafil (CIALIS) 5 MG tablet Take 5 mg by mouth daily. 11/13/19  Yes [provider]  ?testosterone cypionate (DEPOTESTOSTERONE CYPIONATE) 200 MG/ML injection Inject 200 mg into the muscle every 14 (fourteen) days. 07/11/19  Yes [provider]  ?   ? ?Allergies    ?Patient has no known allergies.   ? ?Review of  Systems   ?Review of Systems  ?HENT:  Positive for tinnitus.   ?Eyes:  Positive for visual disturbance.  ?Neurological:  Positive for dizziness and speech difficulty.  ?All other systems reviewed and are negative. ? ?Physical Exam ?Updated Vital Signs ?BP (!) 126/94   Pulse 64   Temp (!) 97.3 ?F (36.3 ?C) (Oral)   Resp 17   SpO2 98%  ?Physical Exam ?Vitals and nursing note reviewed.  ?Constitutional:   ?   General: He is not in acute distress. ?   Appearance: Normal appearance.  ?HENT:  ?   Head: Normocephalic and atraumatic.  ?Eyes:  ?   General:     ?   Right eye: No discharge.     ?   Left eye: No discharge.  ?Cardiovascular:  ?   Rate and Rhythm: Normal rate and regular rhythm.  ?   Heart sounds: No murmur heard. ?  No friction rub. No gallop.  ?Pulmonary:  ?   Effort: Pulmonary effort is normal.  ?   Breath sounds: Normal breath sounds.  ?Abdominal:  ?   General: Bowel sounds are normal.  ?   Palpations: Abdomen is soft.  ?Skin: ?   General: Skin is warm and dry.  ?   Capillary Refill: Capillary refill takes less than 2 seconds.  ?Neurological:  ?   Mental Status: He is alert and oriented to person, place, and time.  ?   Comments: Cranial nerves grossly intact other than patient  with significant horizontal nystagmus, however pupils are equal round reactive to light.  He is sensitive to light.  Some hesitancy, difficulty with finger-to-nose, heel-to-shin.  Unable to assess Romberg, gait secondary to dizziness, inability to stand. Alert and oriented x3.  Moves all 4 limbs spontaneously, abnormal coordination.  No pronator drift.  Intact strength 5 out of 5 bilateral upper and lower extremities. ? ?  ?Psychiatric:     ?   Mood and Affect: Mood normal.     ?   Behavior: Behavior normal.  ? ? ?ED Results / Procedures / Treatments   ?Labs ?(all labs ordered are listed, but only abnormal results are displayed) ?Labs Reviewed  ?COMPREHENSIVE METABOLIC PANEL - Abnormal; Notable for the following components:  ?     Result Value  ? CO2 21 (*)   ? Glucose, Bld 175 (*)   ? Total Protein 6.3 (*)   ? Total Bilirubin 1.5 (*)   ? All other components within normal limits  ?I-STAT CHEM 8, ED - Abnormal; Notable for the following components:  ? Glucose, Bld 173 (*)   ? All other components within normal limits  ?PROTIME-INR  ?APTT  ?CBC  ?DIFFERENTIAL  ?CBG MONITORING, ED  ? ? ?EKG ?None ? ?Radiology ?CT HEAD WO CONTRAST ? ?Result Date: 10/19/2021 ?CLINICAL DATA:  Dizziness, aphasia. EXAM: CT HEAD WITHOUT CONTRAST TECHNIQUE: Contiguous axial images were obtained from the base of the skull through the vertex without intravenous contrast. RADIATION DOSE REDUCTION: This exam was performed according to the departmental dose-optimization program which includes automated exposure control, adjustment of the mA and/or kV according to patient size and/or use of iterative reconstruction technique. COMPARISON:  Outside head CT and cervical spine CT dated 04/01/2020. FINDINGS: Brain: Patchy low-density areas within the cerebellum, largest within the LEFT cerebellum measuring 2.2 cm, suggesting subacute to chronic infarcts. No mass, hemorrhage, edema or other evidence of acute parenchymal abnormality is seen within the supratentorial brain. No midline shift or evidence of herniation. Vascular: No hyperdense vessel or unexpected calcification. Skull: Normal. Negative for fracture or focal lesion. Sinuses/Orbits: Fluid level within the RIGHT maxillary sinus. Remainder of the visualized paranasal sinuses are clear. Periorbital and retro-orbital soft tissues are unremarkable. Other: Displaced/comminuted fractures of the C1 vertebral body are incompletely imaged but are similar to the visualized portion of the upper cervical spine as seen on outside head CT of 04/01/2020, and presumably chronic. IMPRESSION: 1. Patchy low-density areas within the cerebellum, largest within the LEFT cerebellum measuring 2.2 cm, most likely subacute to chronic infarcts,  possibly sequela of a previous trauma, underlying neoplastic process not excluded. Consider brain MRI with contrast for further characterization. 2. No intracranial hemorrhage. 3. Displaced/comminuted fractures of the C1 vertebral body, incompletely imaged but visualized portion similar to the fractures seen on cervical spine CT of 04/01/2020. Recommend cervical spine CT to compared directly with the earlier outside CT and consider surgical consultation to evaluate for instability and/or possible need for surgical fixation. 4. Fluid level within the RIGHT maxillary sinus, suggesting acute sinusitis. Critical Value/emergent results were called by telephone at the time of interpretation on 10/19/2021 at 8:20 am to provider [facility] , who verbally acknowledged these results. Electronically Signed   By: Franki Cabot M.D.   On: 10/19/2021 08:22  ? ?MR BRAIN WO CONTRAST ? ?Result Date: 10/19/2021 ?CLINICAL DATA:  51 year old male with persistent dizziness since 2300 hours. EXAM: MRI HEAD WITHOUT CONTRAST TECHNIQUE: Multiplanar, multiecho pulse sequences of the brain and surrounding structures were  obtained without intravenous contrast. COMPARISON:  CT head and CTA head and neck earlier today. FINDINGS: Brain: Multifocal confluent bilateral cerebellar infarcts with restricted diffusion corresponding to the CT hypodensity, and there is more extensive involvement in the right cerebellar hemisphere than was evident by CT (PICA and AICA territories series 4, image 11). No definite brainstem restricted diffusion, T2 or FLAIR hyperintensity Cerebellar Cytotoxic edema with no hemorrhage. No posterior fossa mass effect at this time. No supratentorial restricted diffusion. No midline shift, mass effect, evidence of mass lesion, ventriculomegaly, extra-axial collection or acute intracranial hemorrhage. Cervicomedullary junction and pituitary are within normal limits. Supratentorial gray and white matter signal within normal  limits. No chronic cerebral blood products. Vascular: Abnormal distal vertebral artery and proximal basilar artery (series 6, image 7) flow voids correlating with abnormal CTA appearance earlier. Distal basilar fl

## 2021-10-19 NOTE — H&P (Signed)
?History and Physical  ? ? ?Patient: Christopher Pugh MCN:[id number] DOB: [birth year] ?DOA: 10/19/2021 ?DOS: the patient was seen and examined on 10/19/2021 ?PCP: Chesley Noon, MD  ?Patient coming from: Home - lives with wife; NOK: Wife, Lytle Malburg (RN/Case Manager at [employer]), [phone] ? ? ?Chief Complaint: Stroke-like symptoms ? ?HPI: Christopher Pugh is a 51 y.o. male with medical history significant of BPH and hypogonadism presenting with stroke-like symptoms. He reports developing dizziness last night about 11pm.  It continued overnight and he had associated nausea.  This AM, he had transient tingling of his R fingers and mildly slurred speech.  He is a generally very healthy and athletic man without significant medical problems.   ? ? ? ?ER Course:  New neuro findings since 11pm.  Vertigo, tinnitus, R arm numbness, slurred speech, diplopia.  Abnormal coordination on exam.  Horizontal nystagmus.  Code stroke, CTA concerning for R vertebral artery.  Neuro recommends admission to SDU.   ? ? ? ? ?Review of Systems: As mentioned in the history of present illness. All other systems reviewed and are negative. ?Past Medical History:  ?Diagnosis Date  ? BPH without obstruction/lower urinary tract symptoms   ? CVA (cerebral vascular accident) ([place]) 10/19/2021  ? Hypogonadism in male   ? ?History reviewed. No pertinent surgical history. ?Social History:  reports that he has never smoked. He has quit using smokeless tobacco. He reports current alcohol use. He reports that he does not use drugs. ? ?No Known Allergies ? ?Family History  ?Problem Relation Age of Onset  ? Hypertension Father   ? Stroke Paternal Aunt   ? ? ?Prior to Admission medications   ?Medication Sig Start Date End Date Taking? Authorizing Provider  ?Cholecalciferol (D3 PO) Take 1 capsule by mouth daily.   Yes [provider]  ?ibuprofen (ADVIL) 200 MG tablet Take 600 mg by mouth every 6 (six) hours as needed for headache or mild pain.   Yes  [provider]  ?Multiple Vitamin (MULTIVITAMIN) tablet Take 1 tablet by mouth daily.   Yes [provider]  ?tadalafil (CIALIS) 5 MG tablet Take 5 mg by mouth daily. 11/13/19  Yes [provider]  ?testosterone cypionate (DEPOTESTOSTERONE CYPIONATE) 200 MG/ML injection Inject 200 mg into the muscle every 14 (fourteen) days. 07/11/19  Yes [provider]  ? ? ?Physical Exam: ?Vitals:  ? 10/19/21 0738 10/19/21 0830 10/19/21 0836 10/19/21 0915  ?BP: (!) 138/94 125/81  (!) 126/94  ?Pulse: 67 67  64  ?Resp: '16 19  17  '$ ?Temp:   (!) 97.3 ?F (36.3 ?C)   ?TempSrc:   Oral   ?SpO2: 100% 100%  98%  ? ?General:  Appears calm and comfortable and is in NAD; head covered (due to dizziness) ?Eyes:  PERRL, EOMI, normal lids, iris ?ENT:  hard of hearing, grossly normal lips & tongue, mmm; appropriate dentition ?Neck:  no LAD, masses or thyromegaly ?Cardiovascular:  RRR, no m/r/g. No LE edema.  ?Respiratory:   CTA bilaterally with no wheezes/rales/rhonchi.  Normal respiratory effort. ?Abdomen:  soft, NT, ND ?Skin:  no rash or induration seen on limited exam; many tattoos ?Musculoskeletal:  grossly normal tone BUE/BLE, good ROM, no bony abnormality ?Psychiatric:  grossly normal mood and affect, speech fluent and appropriate with ?mild dysarthria, AOx3 ?Neurologic:  CN 2-12 grossly intact, moves all extremities in coordinated fashion, sensation intact ? ? ?Radiological Exams on Admission: ?Independently reviewed - see discussion in A/P where applicable ? ?CT HEAD WO  CONTRAST ? ?Result Date: 10/19/2021 ?CLINICAL DATA:  Dizziness, aphasia. EXAM: CT HEAD WITHOUT CONTRAST TECHNIQUE: Contiguous axial images were obtained from the base of the skull through the vertex without intravenous contrast. RADIATION DOSE REDUCTION: This exam was performed according to the departmental dose-optimization program which includes automated exposure control, adjustment of the mA and/or kV according to patient size and/or use  of iterative reconstruction technique. COMPARISON:  Outside head CT and cervical spine CT dated 04/01/2020. FINDINGS: Brain: Patchy low-density areas within the cerebellum, largest within the LEFT cerebellum measuring 2.2 cm, suggesting subacute to chronic infarcts. No mass, hemorrhage, edema or other evidence of acute parenchymal abnormality is seen within the supratentorial brain. No midline shift or evidence of herniation. Vascular: No hyperdense vessel or unexpected calcification. Skull: Normal. Negative for fracture or focal lesion. Sinuses/Orbits: Fluid level within the RIGHT maxillary sinus. Remainder of the visualized paranasal sinuses are clear. Periorbital and retro-orbital soft tissues are unremarkable. Other: Displaced/comminuted fractures of the C1 vertebral body are incompletely imaged but are similar to the visualized portion of the upper cervical spine as seen on outside head CT of 04/01/2020, and presumably chronic. IMPRESSION: 1. Patchy low-density areas within the cerebellum, largest within the LEFT cerebellum measuring 2.2 cm, most likely subacute to chronic infarcts, possibly sequela of a previous trauma, underlying neoplastic process not excluded. Consider brain MRI with contrast for further characterization. 2. No intracranial hemorrhage. 3. Displaced/comminuted fractures of the C1 vertebral body, incompletely imaged but visualized portion similar to the fractures seen on cervical spine CT of 04/01/2020. Recommend cervical spine CT to compared directly with the earlier outside CT and consider surgical consultation to evaluate for instability and/or possible need for surgical fixation. 4. Fluid level within the RIGHT maxillary sinus, suggesting acute sinusitis. Critical Value/emergent results were called by telephone at the time of interpretation on 10/19/2021 at 8:20 am to provider [facility] , who verbally acknowledged these results. Electronically Signed   By: Franki Cabot M.D.   On:  10/19/2021 08:22  ? ?MR BRAIN WO CONTRAST ? ?Addendum Date: 10/19/2021   ?ADDENDUM REPORT: 10/19/2021 10:53 ADDENDUM: Study discussed by telephone with Neurology Dr. Rory Percy on 10/19/2021 at 1039 hours. 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 But a relatively normal sized distal Right Vertebral Artery seems thrombosed on that exam ( "ghost" of that vessel visible on CTA series 8, image 132). Electronically Signed   By: Genevie Ann M.D.   On: 10/19/2021 10:53  ? ?Result Date: 10/19/2021 ?CLINICAL DATA:  51 year old male with persistent dizziness since 2300 hours. EXAM: MRI HEAD WITHOUT CONTRAST TECHNIQUE: Multiplanar, multiecho pulse sequences of the brain and surrounding structures were obtained without intravenous contrast. COMPARISON:  CT head and CTA head and neck earlier today. FINDINGS: Brain: Multifocal confluent bilateral cerebellar infarcts with restricted diffusion corresponding to the CT hypodensity, and there is more extensive involvement in the right cerebellar hemisphere than was evident by CT (PICA and AICA territories series 4, image 11). No definite brainstem restricted diffusion, T2 or FLAIR hyperintensity Cerebellar Cytotoxic edema with no hemorrhage. No posterior fossa mass effect at this time. No supratentorial restricted diffusion. No midline shift, mass effect, evidence of mass lesion, ventriculomegaly, extra-axial collection or acute intracranial hemorrhage. Cervicomedullary junction and pituitary are within normal limits. Supratentorial gray and white matter signal within normal limits. No chronic cerebral blood products. Vascular: Abnormal distal vertebral artery and proximal basilar artery (series 6, image 7)  flow voids correlating with abnormal CTA appearance earlier. Distal basilar flow void, anterior circulation flow voids are maintained. Skull and upper cervical spine: Negative. Visualized bone  marrow signal is within normal limits. Sinuses/Orbits: Postoperative changes to the right globe. Otherwise negative orbits. Right maxillary sinus fluid and/or mucosal thickening. Other: Mastoids are clear. Visible

## 2021-10-19 NOTE — Anesthesia Preprocedure Evaluation (Signed)
Anesthesia Evaluation  ?Preop documentation limited or incomplete due to emergent nature of procedure. ? ?Airway ? ? ? ? ? ? ? Dental ?  ?Pulmonary ? ?  ? ? ? ? ? ? ? Cardiovascular ? ? ? ?  ?Neuro/Psych ?CVA, Residual Symptoms   ? GI/Hepatic ?  ?Endo/Other  ? ? Renal/GU ?  ? ?  ?Musculoskeletal ? ? Abdominal ?  ?Peds ? Hematology ?  ?Anesthesia Other Findings ? ? Reproductive/Obstetrics ? ?  ? ? ? ? ? ? ? ? ? ? ? ? ? ?  ?  ? ? ? ? ? ? ? ? ?Anesthesia Physical ?Anesthesia Plan ? ?ASA: 3 ? ?Anesthesia Plan: General  ? ?Post-op Pain Management: Minimal or no pain anticipated  ? ?Induction: Intravenous, Rapid sequence and Cricoid pressure planned ? ?PONV Risk Score and Plan: 2 and Ondansetron and Dexamethasone ? ?Airway Management Planned: Oral ETT ? ?Additional Equipment:  ? ?Intra-op Plan:  ? ?Post-operative Plan: Possible Post-op intubation/ventilation ? ?Informed Consent:  ? ? ? ?History available from chart only and Only emergency history available ? ?Plan Discussed with: CRNA ? ?Anesthesia Plan Comments:   ? ? ? ? ? ? ?Anesthesia Quick Evaluation ? ?

## 2021-10-19 NOTE — Anesthesia Procedure Notes (Signed)
Procedure Name: Intubation ?Date/Time: 10/19/2021 12:14 PM ?Performed by: Janace Litten, CRNA ?Pre-anesthesia Checklist: Patient identified, Emergency Drugs available, Suction available and Patient being monitored ?Patient Re-evaluated:Patient Re-evaluated prior to induction ?Oxygen Delivery Method: Circle System Utilized ?Preoxygenation: Pre-oxygenation with 100% oxygen ?Induction Type: IV induction ?Ventilation: Mask ventilation without difficulty and Oral airway inserted - appropriate to patient size ?Laryngoscope Size: Mac and 3 ?Grade View: Grade I ?Tube type: Oral ?Tube size: 7.5 mm ?Number of attempts: 1 ?Airway Equipment and Method: Stylet and Oral airway ?Placement Confirmation: ETT inserted through vocal cords under direct vision, positive ETCO2 and breath sounds checked- equal and bilateral ?Secured at: 23 cm ?Tube secured with: Tape ?Dental Injury: Teeth and Oropharynx as per pre-operative assessment  ? ? ? ? ?

## 2021-10-19 NOTE — ED Provider Notes (Signed)
I provided a substantive portion of the care of this patient.  I personally performed the entirety of the history, exam, and medical decision making for this encounter. ? ?  ?.Critical Care ?Performed by: Elnora Morrison, MD ?Authorized by: Elnora Morrison, MD  ? ?Critical care provider statement:  ?  Critical care time (minutes):  40 ?  Critical care start time:  10/19/2021 8:40 AM ?  Critical care end time:  10/19/2021 9:20 AM ?  Critical care time was exclusive of:  Separately billable procedures and treating other patients and teaching time ?  Critical care was necessary to treat or prevent imminent or life-threatening deterioration of the following conditions:  CNS failure or compromise ?  Critical care was time spent personally by me on the following activities:  Development of treatment plan with patient or surrogate, discussions with consultants, examination of patient, ordering and review of laboratory studies, ordering and review of radiographic studies, pulse oximetry and re-evaluation of patient's condition ? ?  ?Elnora Morrison, MD ?10/23/21 1612 ? ?

## 2021-10-19 NOTE — ED Notes (Signed)
Pt transported to MRI 

## 2021-10-19 NOTE — Transfer of Care (Signed)
Immediate Anesthesia Transfer of Care Note ? ?Patient: Christopher Pugh ? ?Procedure(s) Performed: IR WITH ANESTHESIA ? ?Patient Location: ICU ? ?Anesthesia Type:General ? ?Level of Consciousness: drowsy, patient cooperative and responds to stimulation ? ?Airway & Oxygen Therapy: Patient Spontanous Breathing ? ?Post-op Assessment: Report given to RN and Post -op Vital signs reviewed and stable ? ?Post vital signs: Reviewed and stable ? ?Last Vitals:  ?Vitals Value Taken Time  ?BP    ?Temp    ?Pulse 75 10/19/21 1358  ?Resp 14 10/19/21 1358  ?SpO2 95 % 10/19/21 1358  ?Vitals shown include unvalidated device data. ? ?Last Pain:  ?Vitals:  ? 10/19/21 0836  ?TempSrc: Oral  ?PainSc:   ?   ? ?  ? ?Complications: No notable events documented. ?

## 2021-10-19 NOTE — ED Notes (Signed)
Attempted to get a Temp. 4 times would not work  ?

## 2021-10-19 NOTE — Code Documentation (Signed)
Stroke Response Nurse Documentation ?Code Documentation ? ?Christopher Pugh is a 51 y.o. male arriving to [facility]  via [facility] on 10/19/2021 with no past medical history. Code stroke was activated by ED.  ? ?Patient from home where he was [place] at 2245. Wife reports he was complaining of dizziness at 2300 and lowered himself to the floor, then vomited. Dizziness remained but reduced. Woke up this morning with double vision and ringing in the ears along with continued dizziness.  ? ?TPatient to CT with team. NIHSS 2, see documentation for details and code stroke times. Patient with bilateral limb ataxia on exam. The following imaging was completed:  CT Head, CTA, and CTP. Patient is not a candidate for IV Thrombolytic due to out of window. Dr. Rory Percy discussed case with IR provider. Plan for scheduled angio.  ? ?Care Plan: Q2 assessments. Activate code stroke if worsening of symptoms.  ? ?Bedside handoff with ED RN [place].   ? ?Leverne Humbles ?Stroke Response RN ? ? ?

## 2021-10-19 NOTE — ED Triage Notes (Addendum)
Pt reports vertigo that started last night around 11pm.  Pt fell asleep on the couch and got up this morning with R arm numbness and slurred speech.  Last known well 11pm.  No arm drift.  VAN negative.  Normal sensation bilaterally.  C/o nausea and vomiting. ?

## 2021-10-20 ENCOUNTER — Encounter (HOSPITAL_COMMUNITY): Payer: Self-pay | Admitting: Radiology

## 2021-10-20 ENCOUNTER — Other Ambulatory Visit: Payer: Self-pay | Admitting: Cardiology

## 2021-10-20 DIAGNOSIS — I639 Cerebral infarction, unspecified: Secondary | ICD-10-CM | POA: Diagnosis not present

## 2021-10-20 DIAGNOSIS — R42 Dizziness and giddiness: Secondary | ICD-10-CM

## 2021-10-20 DIAGNOSIS — I6349 Cerebral infarction due to embolism of other cerebral artery: Secondary | ICD-10-CM

## 2021-10-20 LAB — HOMOCYSTEINE: Homocysteine: 5.8 umol/L (ref 0.0–14.5)

## 2021-10-20 MED ORDER — ROSUVASTATIN CALCIUM 20 MG PO TABS
20.0000 mg | ORAL_TABLET | Freq: Every day | ORAL | Status: DC
  Administered 2021-10-20 – 2021-10-21 (×2): 20 mg via ORAL
  Filled 2021-10-20 (×2): qty 1

## 2021-10-20 MED ORDER — ASPIRIN EC 81 MG PO TBEC
81.0000 mg | DELAYED_RELEASE_TABLET | Freq: Every day | ORAL | Status: DC
Start: 2021-10-20 — End: 2021-10-21
  Administered 2021-10-20 – 2021-10-21 (×2): 81 mg via ORAL
  Filled 2021-10-20 (×2): qty 1

## 2021-10-20 MED ORDER — CLOPIDOGREL BISULFATE 75 MG PO TABS
75.0000 mg | ORAL_TABLET | Freq: Every day | ORAL | Status: DC
  Administered 2021-10-20 – 2021-10-21 (×2): 75 mg via ORAL
  Filled 2021-10-20 (×2): qty 1

## 2021-10-20 NOTE — Progress Notes (Addendum)
STROKE TEAM PROGRESS NOTE  ? ?INTERVAL HISTORY ?His wife is at the bedside.  Patient states he feels 70% back to normal. He does notice some dizziness with fast movements in the bed, but the ringing in his ears has stopped. He has not been out of bed yet.  ?Reports that he maintains a very healthy lifestyle and does CrossFit multiple times a week, weightlifting and running. He is an Journalist, newspaper at a local high school. ? ?Vitals:  ? 10/20/21 1100 10/20/21 1149 10/20/21 1200 10/20/21 1300  ?BP: (!) 136/91  120/84 115/88  ?Pulse: 76  66 89  ?Resp: (!) 23  20 (!) 23  ?Temp:  97.7 ?F (36.5 ?C)    ?TempSrc:  Axillary    ?SpO2: 98%  95% 96%  ?Weight:      ?Height:      ? ?CBC:  ?Recent Labs  ?Lab 10/19/21 ?0740 10/19/21 ?1610  ?WBC 8.7  --   ?NEUTROABS 7.5  --   ?HGB 14.8 14.6  ?HCT 43.3 43.0  ?MCV 93.7  --   ?PLT 231  --   ? ?Basic Metabolic Panel:  ?Recent Labs  ?Lab 10/19/21 ?0740 10/19/21 ?9604  ?NA 136 138  ?K 4.2 4.3  ?CL 106 105  ?CO2 21*  --   ?GLUCOSE 175* 173*  ?BUN 18 20  ?CREATININE 1.07 1.00  ?CALCIUM 9.2  --   ? ?Lipid Panel:  ?Recent Labs  ?Lab 10/19/21 ?1120  ?CHOL 193  ?TRIG 34  ?HDL 54  ?CHOLHDL 3.6  ?VLDL 7  ?Cohoes 132*  ? ?HgbA1c:  ?Recent Labs  ?Lab 10/19/21 ?1120  ?HGBA1C 5.3  ? ?Urine Drug Screen: No results for input(s): LABOPIA, COCAINSCRNUR, LABBENZ, AMPHETMU, THCU, LABBARB in the last 168 hours.  ?Alcohol Level No results for input(s): ETH in the last 168 hours. ? ?IMAGING past 24 hours ?[place] ? ?Result Date: 10/19/2021 ?INDICATION: 51 year old male with past medical history significant for BPH, hypogonadism and prior C1 burst fracture presenting with acute onset of dizziness, ringing in his ears and hearing loss. NIHSS at presentation was 2 for dysmetria; baseline modified Rankin scale 0. His last known well was 10:45 p.m. on 10/18/2021 when he head dizziness spell that was worse by the morning. Head CT showed bilateral cerebellar hypodensities concerning for acute/subacute  infarcts when neurology was CT/CT angiogram of the head and neck showed occlusion at the vertebrobasilar junction. No IV thrombo lytic given as patient was outside the window the window. EXAM: ULTRASOUND-GUIDED VASCULAR ACCESS DIAGNOSTIC CEREBRAL ANGIOGRAM MECHANICAL THROMBECTOMY FLAT PANEL HEAD CT COMPARISON:  CT/CT angiogram of the head and neck October 19, 2021 MEDICATIONS: No antibiotics administered. ANESTHESIA/SEDATION: The procedure was performed under general anesthesia. CONTRAST:  75 mL of Omnipaque 300 milligram/mL FLUOROSCOPY: Radiation Exposure Index (as provided by the fluoroscopic device): 5409 mGy Kerma COMPLICATIONS: None immediate. TECHNIQUE: Informed written consent was obtained from the patient after a thorough discussion of the procedural risks, benefits and alternatives. All questions were addressed. Maximal Sterile Barrier Technique was utilized including caps, mask, sterile gowns, sterile gloves, sterile drape, hand hygiene and skin antiseptic. A timeout was performed prior to the initiation of the procedure. The right groin was prepped and draped in the usual sterile fashion. Using a micropuncture kit and the modified Seldinger technique, access was gained to the right common femoral artery and an 8 French sheath was placed. Real-time ultrasound guidance was utilized for vascular access including the acquisition of a permanent ultrasound image documenting patency of  the accessed vessel. Under fluoroscopy, a Zoom 88 guide catheter was navigated over a 6 [place] Berenstein 2 catheter and a 0.035" Terumo Glidewire into the aortic arch. The catheter was placed into the left subclavian artery. Frontal and lateral angiograms of the head were obtained. The catheter was then advanced into the left vertebral artery. Frontal and lateral angiograms of the head were obtained. The Zoom 88 guide catheter was advanced over the Berenstein catheter into the left vertebral artery. The diagnostic catheter was  removed. Frontal and lateral angiograms of the head were obtained. FINDINGS: 1. Normal caliber of the right common femoral artery, adequate for vascular access. 2. Patent left vertebral artery with increased tortuosity and kinking of the distal V2 segment. 3. 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 4. Patent V4 segment of the right vertebral artery. PROCEDURE: Using biplane roadmap guidance, a zoom 55 aspiration catheter was navigated over a Prowler Ex microcatheter and a synchro 2 microguidewire into the cavernous segment of the intracranial left vertebral artery. The microcatheter was then navigated over the wire into the left superior cerebellar artery. Then, a 5 x 37 mm embotrap stent retriever was deployed spanning the basilar artery. The device was allowed to intercalated with the clot for 4 minutes. The microcatheter was removed. The aspiration catheter was advanced to the level of occlusion and connected to an aspiration pump. The thrombectomy device and aspiration catheter were removed under constant aspiration. Left vertebral artery angiograms with frontal, lateral and multiple magnified oblique views of the head showed recanalization of the basilar artery. Non occlusive clot noted in the bilateral AICA, extending into the basilar without flow restriction. Using biplane roadmap, a Zoom 35 aspiration catheter was navigated over a synchro 2 microguidewire into the basilar artery. The aspiration catheter was then advanced to the level of occlusion in the right AICA and connected to an aspiration pump. Continuous aspiration was performed for 2 minutes. The guide catheter was connected to a VacLok syringe. The aspiration catheter was subsequently removed under constant aspiration. The guide catheter was aspirated for debris. Left vertebral artery angiograms with frontal and lateral views of the head showed persistent occlusion of the right AICA with decreased burden of  clot protruding into the basilar artery. Flat panel CT of the head was obtained and post processed in a separate workstation with concurrent attending physician supervision. Selected images were sent to PACS. No evidence of hemorrhagic complication. Left vertebral artery angiograms with magnified frontal and lateral views of the head was obtained and utilized as biplane roadmap. Using biplane roadmap, a Zoom 35 aspiration catheter was navigated over a synchro 2 microguidewire into the basilar artery. The microwire was advanced into the left AICA. However, attempt to advance the catheter into the left AICA proved unsuccessful with herniation of the catheter in the basilar artery. The catheter was subsequently withdrawn. Left vertebral artery angiogram showed patent basilar artery with nonocclusive clot in the left AICA with preserved anterograde flow. Clot seen in the right AICA with minimal delayed flow. Normal flow noted in the left vertebral artery, basilar artery, superior cerebellar arteries, posterior cerebral arteries, V3 and V4 segments of the right vertebral artery. The guide catheter was subsequently withdrawn under continuous contrast injection. No evidence of iatrogenic injury to the left vertebral artery. Right common femoral artery angiogram was obtained in right anterior oblique view. The puncture is at the level of the common femoral artery. The artery has normal caliber, adequate for closure device. The sheath  was exchanged over the wire for a Perclose prostyle which was utilized for access closure. Immediate hemostasis was achieved. IMPRESSION: 1. Successful mechanical thrombectomy for treatment of an occlusion of the proximal basilar artery with complete recanalization (TICI 3) after 1 pass. 2. Attempted thrombectomy of the right AICA with persistent filling defect with minimal anterograde flow (TICI 1). 3. No evidence of hemorrhagic complication. PLAN: Transfer to ICU. Electronically Signed   By:  Pedro Earls M.D.   On: 10/19/2021 18:14  ? ?IR US Guide Vasc Access Right ? ?Result Date: 10/19/2021 ?INDICATION: 51 year old male with past medical history significant for BPH, hypogonadism an

## 2021-10-20 NOTE — TOC CAGE-AID Note (Signed)
Transition of Care (TOC) - CAGE-AID Screening ? ? ?Patient Details  ?Name: Christopher Pugh ?MRN: [id number] ?Date of Birth: 08-Jun-1971 ? ?Transition of Care (TOC) CM/SW Contact:    ?Novak Stgermaine C Tarpley-Carter, LCSWA ?Phone Number: ?10/20/2021, 1:55 PM ? ? ?Clinical Narrative: ?Pt participated in [place].  Pt stated he does not use substance, but does drink ETOH socially.  Pt was offered resources, due to usage of ETOH.    ? ?Passenger transport manager, MSW, LCSW-A ?Pronouns:  She/Her/Hers ?[facility] HealthTransitions of Care ?Clinical Social Worker ?Direct Number:  [phone] ?[email or web address] ? ?CAGE-AID Screening: ?  ? ?Have You Ever Felt You Ought to Cut Down on Your Drinking or Drug Use?: No ?Have People Annoyed You By Critizing Your Drinking Or Drug Use?: No ?Have You Felt Bad Or Guilty About Your Drinking Or Drug Use?: No ?Have You Ever Had a Drink or Used Drugs First Thing In The Morning to Steady Your Nerves or to Get Rid of a Hangover?: No ?CAGE-AID Score: 0 ? ?Substance Abuse Education Offered: Yes ? ?Substance abuse interventions: Educational Materials ? ? ? ? ? ? ?

## 2021-10-20 NOTE — Progress Notes (Signed)
PT Cancellation Note ? ?Patient Details ?Name: Christopher Pugh ?MRN: [id number] ?DOB: Dec 14, 1970 ? ? ?Cancelled Treatment:    Reason Eval/Treat Not Completed: Active bedrest order. Will follow as activity orders advanced.  ? ?Leighton Roach, PT  ?Acute Rehab Services ? Pager [phone] ?Office [phone] ? ? ? ?[place] ?10/20/2021, 8:33 AM ?

## 2021-10-20 NOTE — Evaluation (Signed)
Speech Language Pathology Evaluation ?Patient Details ?Name: MORDCHE HEDGLIN ?MRN: [id number] ?DOB: September 10, 1970 ?Today's Date: 10/20/2021 ?Time: 2706-2376 ?SLP Time Calculation (min) (ACUTE ONLY): 21 min ? ?Problem List:  ?Patient Active Problem List  ? Diagnosis Date Noted  ? Acute CVA (cerebrovascular accident) ([place]) 10/19/2021  ? BPH without obstruction/lower urinary tract symptoms 10/19/2021  ? Hypogonadism in male 10/19/2021  ? Alcohol abuse 10/19/2021  ? C1 cervical fracture ([place]) 10/19/2021  ? ?Past Medical History:  ?Past Medical History:  ?Diagnosis Date  ? BPH without obstruction/lower urinary tract symptoms   ? CVA (cerebral vascular accident) ([place]) 10/19/2021  ? Hypogonadism in male   ? ?Past Surgical History:  ?Past Surgical History:  ?Procedure Laterality Date  ? IR CT HEAD LTD  10/19/2021  ? IR PERCUTANEOUS ART THROMBECTOMY/INFUSION INTRACRANIAL INC DIAG ANGIO  10/19/2021  ? IR US GUIDE VASC ACCESS RIGHT  10/19/2021  ? RADIOLOGY WITH ANESTHESIA N/A 10/19/2021  ? Procedure: IR WITH ANESTHESIA;  Surgeon: Radiologist, Medication, MD;  Location: [facility];  Service: Radiology;  Laterality: N/A;  ? ?HPI:  ?Pt is 51 yo male who presents with dizziness, nausea, tingling in R fingers and slurred speech. 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 Underwent thrombectomy 4/17.  PMH: BPH, hypogonadism, EtOH abuse  ? ?Assessment / Plan / Recommendation ?Clinical Impression ? Pt scored 21/20 on the SLUMS, suggestive of mild cognitive impairment. His speech and language were clear and fluent; back to baseline per his report. Throughout more complex cognitive tasks, pt exhibited some impulsivity and decreased selective attention and working memory that impacted his accuracy. Although he and his wife acknowledge that he can be prone to this at his baseline, they say his reduced self-monitoring and self-correcting is different than his norm. Given these changes in higher level executive functions, recommend ongoing SLP f/u acutely and at OP  level upon discharge. ?   ?SLP Assessment ? SLP Recommendation/Assessment: Patient needs continued [facility] Services ?SLP Visit Diagnosis: Cognitive communication deficit (R41.841)  ?  ?Recommendations for follow up therapy are one component of a multi-disciplinary discharge planning process, led by the attending physician.  Recommendations may be updated based on patient status, additional functional criteria and insurance authorization. ?   ?Follow Up Recommendations ? Outpatient SLP  ?  ?Assistance Recommended at Discharge ? Intermittent Supervision/Assistance (more assist upon initial return home)  ?Functional Status Assessment Patient has had a recent decline in their functional status and demonstrates the ability to make significant improvements in function in a reasonable and predictable amount of time.  ?Frequency and Duration min 2x/week  ?2 weeks ?  ?   ?SLP Evaluation ?Cognition ? Overall Cognitive Status: Impaired/Different from baseline ?Arousal/Alertness: Awake/alert ?Orientation Level: Oriented to person;Oriented to place;Oriented to situation;Disoriented to time ?Year: Other (Comment) (2003) ?Day of Week: Correct ?Attention: Selective ?Selective Attention: Impaired ?Selective Attention Impairment: Verbal complex ?Memory: Impaired ?Memory Impairment: Other (comment) (working memory) ?Awareness: Impaired ?Awareness Impairment: Emergent impairment ?Problem Solving: Impaired ?Problem Solving Impairment: Verbal complex;Functional complex ?Executive Function: Self Monitoring;Self Correcting ?Self Monitoring: Impaired ?Self Monitoring Impairment: Verbal complex;Functional complex ?Self Correcting: Impaired ?Self Correcting Impairment: Verbal complex;Functional complex ?Behaviors: Impulsive ?Safety/Judgment: Appears intact  ?  ?   ?Comprehension ? Auditory Comprehension ?Overall Auditory Comprehension: Appears within functional limits for tasks assessed  ?  ?Expression Expression ?Primary  Mode of Expression: Verbal ?Verbal Expression ?Overall Verbal Expression: Appears within functional limits for tasks assessed ?Written Expression ?Dominant Hand: Right   ?Oral / Motor ? Motor Speech ?Overall Motor Speech:  Appears within functional limits for tasks assessed   ?        ? ?Osie Bond., M.A. CCC-SLP ?Acute Rehabilitation Services ?Office [phone] ? ?Secure chat preferred ? ?10/20/2021, 12:45 PM ? ?

## 2021-10-20 NOTE — Progress Notes (Signed)
Nutrition Brief Note ? ?Nutrition Assessment Consult ? ?Spoke with pt and his wife. They report no recent weight changes and good appetite PTA. Per wife pt meal preps and takes a protein like chicken and rice for lunches. Pt reports liking fruits and vegetables but does not always eat them.  ?Encouraged heart healthy diet to include adding fruits and vegetables at meals/snacks.  ?Pt and wife agreeable.  ? ? ?Wt Readings from Last 15 Encounters:  ?10/19/21 86.2 kg  ? ? ?Body mass index is 28.89 kg/m?.  ? ?Current diet order is Heart Healthy, patient tolerating meals at this time. Labs and medications reviewed.  ? ?No further nutrition interventions warranted at this time. If nutrition issues arise, please consult RD.  ? ?Lockie Pares., RD, LDN, CNSC ?See AMiON for contact information  ? ? ?

## 2021-10-20 NOTE — Anesthesia Postprocedure Evaluation (Signed)
Anesthesia Post Note ? ?Patient: THADDAEUS GRANJA ? ?Procedure(s) Performed: IR WITH ANESTHESIA ? ?  ? ?Patient location during evaluation: PACU ?Anesthesia Type: General ?Level of consciousness: awake and alert ?Pain management: pain level controlled ?Vital Signs Assessment: post-procedure vital signs reviewed and stable ?Respiratory status: spontaneous breathing, nonlabored ventilation, respiratory function stable and patient connected to nasal cannula oxygen ?Cardiovascular status: blood pressure returned to baseline and stable ?Postop Assessment: no apparent nausea or vomiting ?Anesthetic complications: no ? ? ?No notable events documented. ? ?Last Vitals:  ?Vitals:  ? 10/20/21 0600 10/20/21 0700  ?BP: 140/74 110/72  ?Pulse: 68 60  ?Resp: (!) 23 15  ?Temp:    ?SpO2: 95% 94%  ?  ?Last Pain:  ?Vitals:  ? 10/20/21 0600  ?TempSrc:   ?PainSc: Asleep  ? ? ?  ?  ?  ?  ?  ?  ? ?[place] [facility] ? ? ? ? ?

## 2021-10-20 NOTE — Progress Notes (Signed)
OT Cancellation Note ? ?Patient Details ?Name: Christopher Pugh ?MRN: [id number] ?DOB: [birth year] ? ? ?Cancelled Treatment:    Reason Eval/Treat Not Completed: Active bedrest order ? ?[name] ?10/20/2021, 8:58 AM ?

## 2021-10-20 NOTE — Progress Notes (Signed)
? ?  Cardiac monitor order for Dx of Stroke. Dr. Sallyanne Kuster to read (new pt, DOD). Outpatient follow up arranged.  ?
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
## 2021-10-21 ENCOUNTER — Inpatient Hospital Stay (HOSPITAL_COMMUNITY): Payer: BC Managed Care – PPO

## 2021-10-21 ENCOUNTER — Other Ambulatory Visit (HOSPITAL_COMMUNITY): Payer: Self-pay

## 2021-10-21 DIAGNOSIS — I639 Cerebral infarction, unspecified: Secondary | ICD-10-CM | POA: Diagnosis not present

## 2021-10-21 DIAGNOSIS — G4486 Cervicogenic headache: Secondary | ICD-10-CM | POA: Diagnosis not present

## 2021-10-21 DIAGNOSIS — E7800 Pure hypercholesterolemia, unspecified: Secondary | ICD-10-CM

## 2021-10-21 DIAGNOSIS — N400 Enlarged prostate without lower urinary tract symptoms: Secondary | ICD-10-CM | POA: Diagnosis not present

## 2021-10-21 DIAGNOSIS — E291 Testicular hypofunction: Secondary | ICD-10-CM | POA: Diagnosis not present

## 2021-10-21 LAB — CBC
HCT: 37.7 % — ABNORMAL LOW (ref 39.0–52.0)
Hemoglobin: 13.2 g/dL (ref 13.0–17.0)
MCH: 32.2 pg (ref 26.0–34.0)
MCHC: 35 g/dL (ref 30.0–36.0)
MCV: 92 fL (ref 80.0–100.0)
Platelets: 210 10*3/uL (ref 150–400)
RBC: 4.1 MIL/uL — ABNORMAL LOW (ref 4.22–5.81)
RDW: 11.8 % (ref 11.5–15.5)
WBC: 5.3 10*3/uL (ref 4.0–10.5)
nRBC: 0 % (ref 0.0–0.2)

## 2021-10-21 LAB — CARDIOLIPIN ANTIBODIES, IGG, IGM, IGA
Anticardiolipin IgA: <9 APL U/mL (ref 0–11)
Anticardiolipin IgG: <9 GPL U/mL (ref 0–14)
Anticardiolipin IgM: <9 MPL U/mL (ref 0–12)

## 2021-10-21 LAB — BETA-2-GLYCOPROTEIN I ABS, IGG/M/A
Beta-2 Glyco I IgG: <9 GPI IgG units (ref 0–20)
Beta-2-Glycoprotein I IgA: <9 GPI IgA units (ref 0–25)
Beta-2-Glycoprotein I IgM: <9 GPI IgM units (ref 0–32)

## 2021-10-21 LAB — BASIC METABOLIC PANEL
Anion gap: 6 (ref 5–15)
BUN: 12 mg/dL (ref 6–20)
CO2: 23 mmol/L (ref 22–32)
Calcium: 8.8 mg/dL — ABNORMAL LOW (ref 8.9–10.3)
Chloride: 108 mmol/L (ref 98–111)
Creatinine, Ser: 1.05 mg/dL (ref 0.61–1.24)
GFR, Estimated: >60 mL/min (ref >60)
Glucose, Bld: 106 mg/dL — ABNORMAL HIGH (ref 70–99)
Potassium: 3.9 mmol/L (ref 3.5–5.1)
Sodium: 137 mmol/L (ref 135–145)

## 2021-10-21 IMAGING — MR MR HEAD W/O CM
6 series · 31 of 48 positions shown · non-contrast
Comparison: [DATE]

CLINICAL DATA: Stroke, follow up; post mechanical thrombectomy for
basilar artery occlusion

EXAM:
MRI HEAD WITHOUT CONTRAST
MRA HEAD WITHOUT CONTRAST
TECHNIQUE: Multiplanar, multi-echo pulse sequences of the brain and surrounding
structures were acquired without intravenous contrast. Angiographic
images of the Circle of Willis were acquired using MRA technique
without intravenous contrast.

[Series 2: DWI · axial · 3.0mm · 0.94mm/px · z∈[-93,+51]mm · 8 of 98 slices shown (1 of 2)]
[im 1/98]
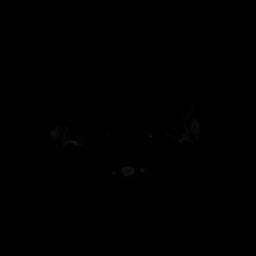
[im 11/98]
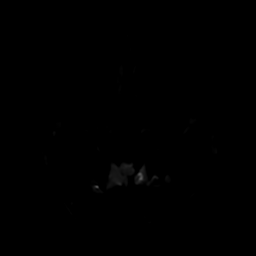
[im 33/98]
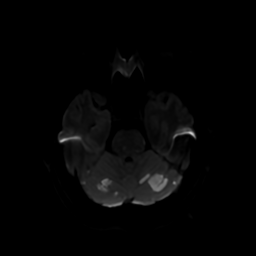
[im 44/98]
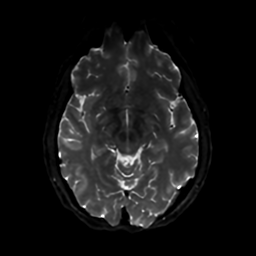
[im 54/98]
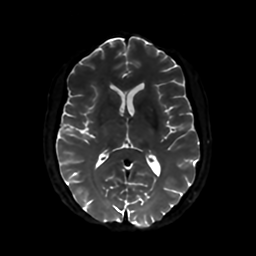
[im 65/98]
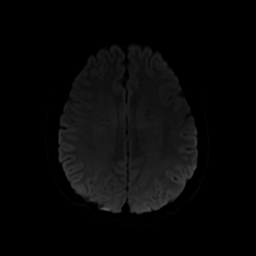
[im 87/98]
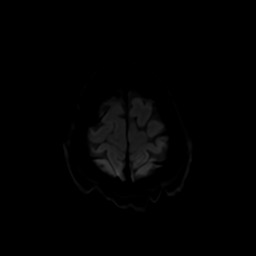
[im 98/98]
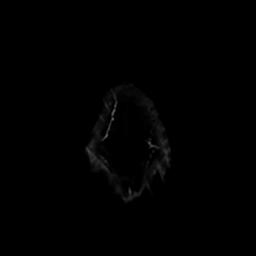

[Series 3: DWI · coronal · 4.0mm · 0.94mm/px · 8 of 78 slices shown (2 of 2)]
[im 1/78]
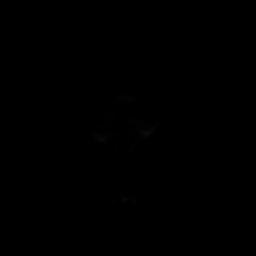
[im 12/78]
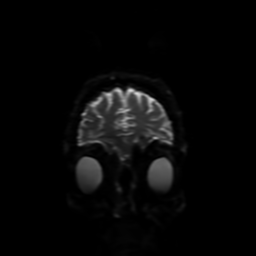
[im 23/78]
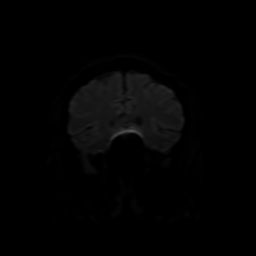
[im 34/78]
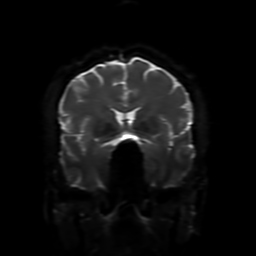
[im 45/78]
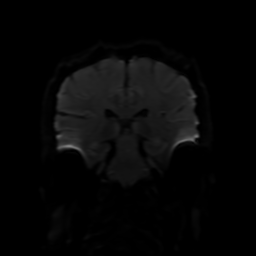
[im 56/78]
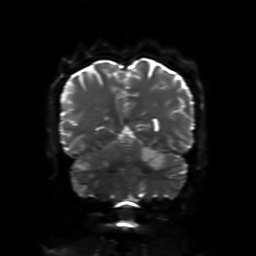
[im 67/78]
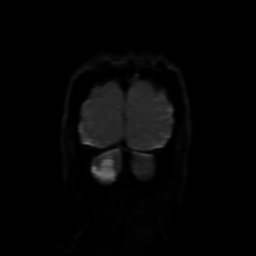
[im 78/78]
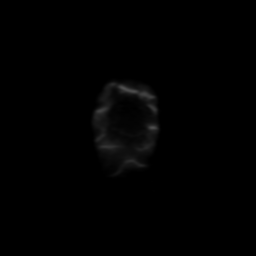

[Series 4: FLAIR · sagittal · 5.0mm · 0.23mm/px · 2 of 24 slices shown]
[im 1/24]
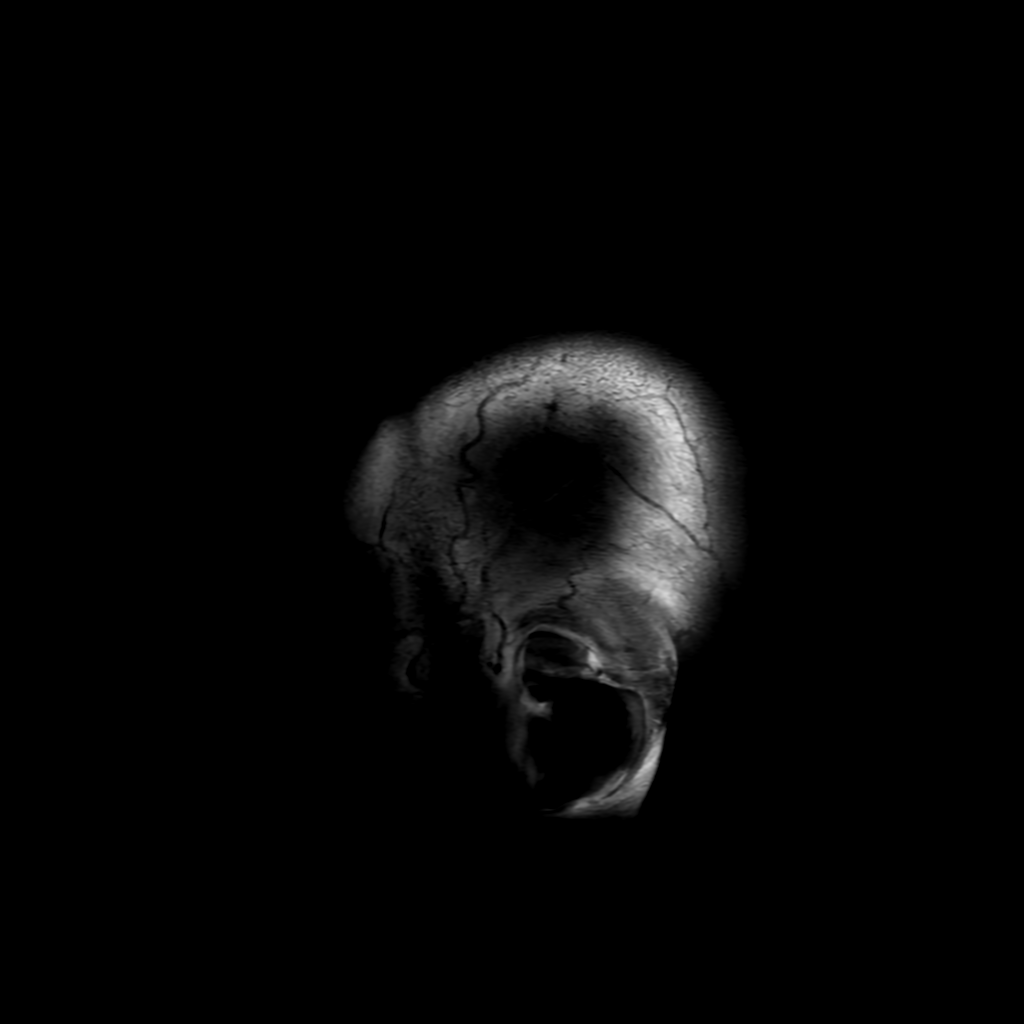
[im 24/24]
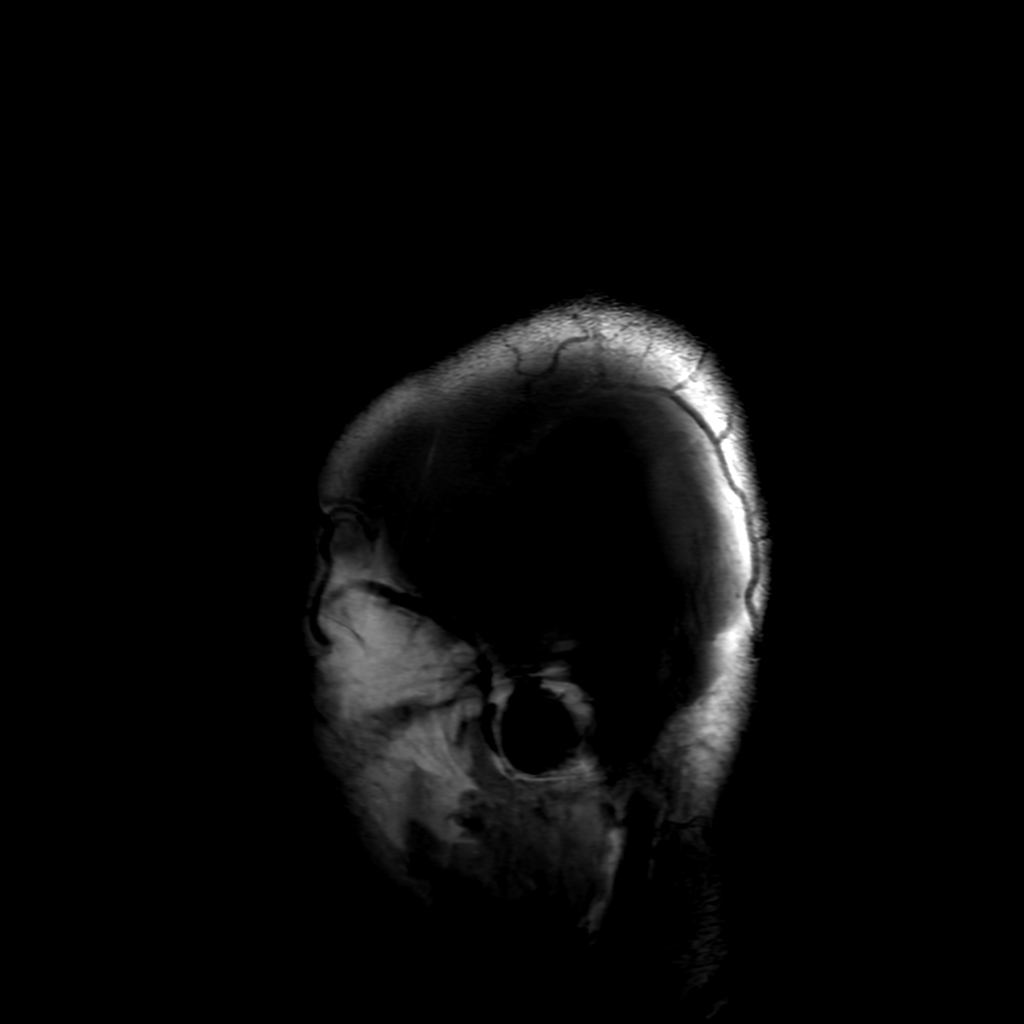

[Series 5: ax (id) · axial · 1.0mm · 0.43mm/px · z∈[-63,-27]mm · 4 of 187 slices shown]
[im 11/187]
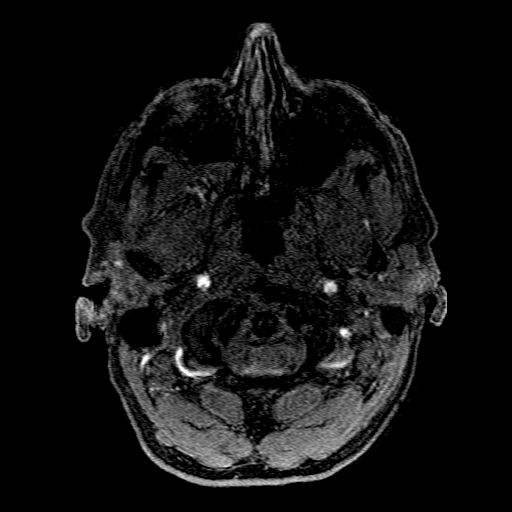
[im 32/187]
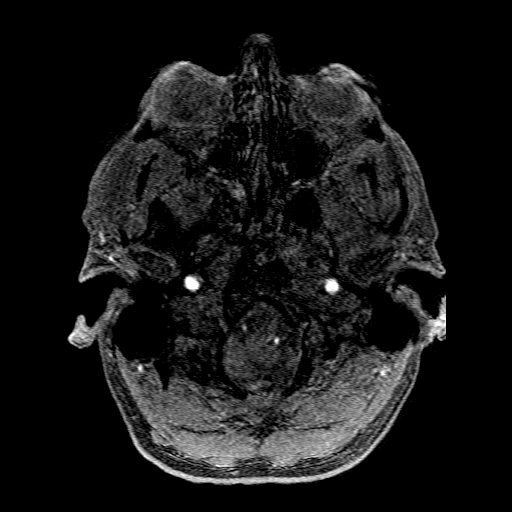
[im 52/187]
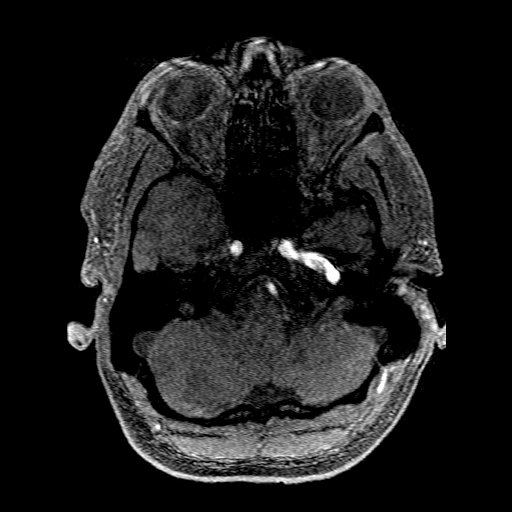
[im 83/187]
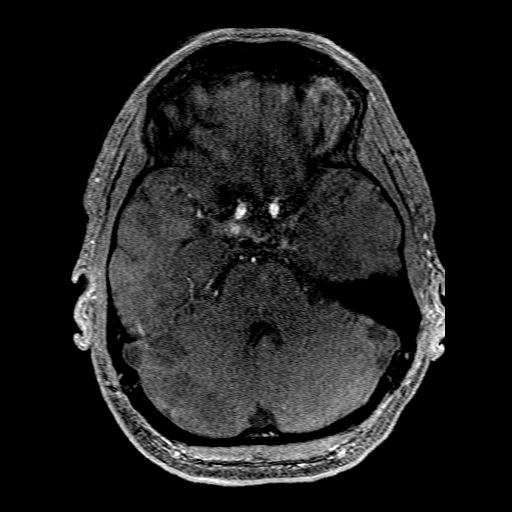

[Series 250: ADC · axial · 3.0mm · 0.94mm/px · z∈[-93,+51]mm · 5 of 48 slices shown (1 of 2)]
[im 1/48]
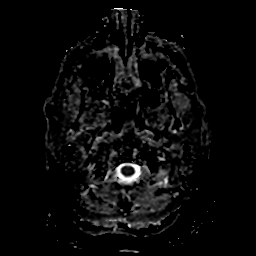
[im 12/48]
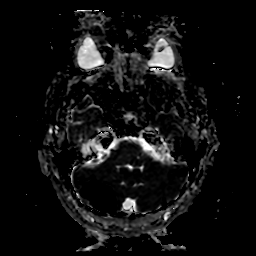
[im 24/48]
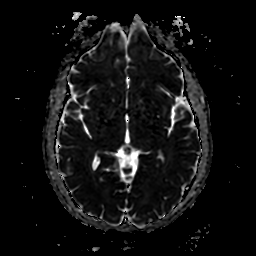
[im 36/48]
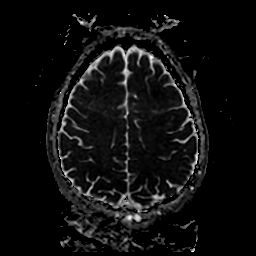
[im 48/48]
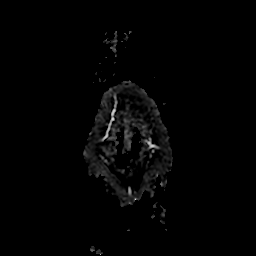

[Series 350: ADC · coronal · 4.0mm · 0.94mm/px · 4 of 38 slices shown (2 of 2)]
[im 1/38]
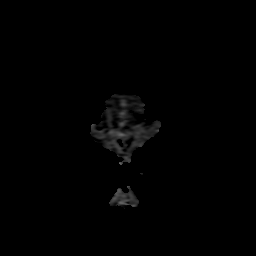
[im 13/38]
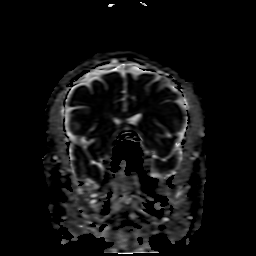
[im 25/38]
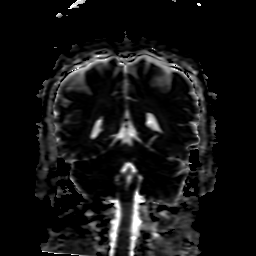
[im 38/38]
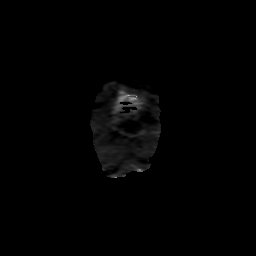

[31 of 48 positions shown; findings below may reference images not displayed]

FINDINGS: MRI HEAD

DWI and sagittal T1 sequences obtained.

Acute bilateral cerebellar infarcts are again identified. Extent of
involvement is mildly greater. No significant mass effect. No
hydrocephalus.

MRA HEAD

Intracranial internal carotid arteries are patent. Middle and
anterior cerebral arteries are patent. Intracranial vertebral
arteries are patent. Basilar artery is patent. Patent left PICA and
AICA origins identified. Patent bilateral SCA origins. Bilateral
posterior communicating arteries are present. Patent bilateral
posterior cerebral arteries with flow primarily from the posterior
communicating arteries.
IMPRESSION: Acute bilateral cerebellar infarcts with mild increase in extent of
involvement. No significant mass effect.

Patent vertebral and basilar arteries.

## 2021-10-21 MED ORDER — CLOPIDOGREL BISULFATE 75 MG PO TABS
75.0000 mg | ORAL_TABLET | Freq: Every day | ORAL | 0 refills | Status: DC
  Filled 2021-10-21: qty 21, 21d supply, fill #0

## 2021-10-21 MED ORDER — ASPIRIN 81 MG PO TBEC
81.0000 mg | DELAYED_RELEASE_TABLET | Freq: Every day | ORAL | 11 refills | Status: AC
Start: 2021-10-22 — End: ?
  Filled 2021-10-21: qty 30, 30d supply, fill #0

## 2021-10-21 MED ORDER — ROSUVASTATIN CALCIUM 20 MG PO TABS
20.0000 mg | ORAL_TABLET | Freq: Every day | ORAL | 3 refills | Status: DC
  Filled 2021-10-21: qty 90, 90d supply, fill #0
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
## 2021-10-21 NOTE — Plan of Care (Signed)
?  Problem: Education: ?Goal: Knowledge of disease or condition will improve ?Outcome: Adequate for Discharge ?Goal: Knowledge of secondary prevention will improve (SELECT ALL) ?Outcome: Adequate for Discharge ?Goal: Knowledge of patient specific risk factors will improve (INDIVIDUALIZE FOR PATIENT) ?Outcome: Adequate for Discharge ?Goal: Individualized Educational Video(s) ?Outcome: Adequate for Discharge ?  ?Problem: Coping: ?Goal: Will verbalize positive feelings about self ?Outcome: Adequate for Discharge ?Goal: Will identify appropriate support needs ?Outcome: Adequate for Discharge ?  ?Problem: Health Behavior/Discharge Planning: ?Goal: Ability to manage health-related needs will improve ?Outcome: Adequate for Discharge ?  ?

## 2021-10-21 NOTE — Discharge Instructions (Signed)
You had stroke and was treated in [facility]. So far you are doing well and ready to be discharged. However, if you experienced any worsening symptoms, especially severe headache, nausea vomiting or decreased level of consciousness, please call EMS or go to nearest ER.  ?You need to take aspirin and plavix for 3 weeks and then aspirin alone. Continue crestor.  ?Be careful with neck manipulation, be careful with abrupt neck movement ?We recommend room rest and light activity for the next 1-2 weeks. Then if you feel well, you can go back to work with part time for one wee, then full time if you feel not overexerted.  ?Follow up with primary care physician in 1-2 weeks ?You will follow up with stroke neurologist Dr. Leonie Man at [facility] in 4 weeks. The office number is attached in the discharge paperwork.  ?
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
## 2021-10-21 NOTE — Progress Notes (Signed)
Patient discharged from unit at this time. All discharge instructions reviewed with patient with good understanding verbalized. PIV removed. All belongings gathered. TOC pharmacy delivered ordered medications. VSS. Left unit via RN and wheelchair to main lobby.  ?

## 2021-10-22 LAB — PROTEIN S, TOTAL: Protein S Ag, Total: 96 % (ref 60–150)

## 2021-10-22 LAB — PROTEIN S ACTIVITY: Protein S Activity: 87 % (ref 63–140)

## 2021-10-22 LAB — PTT-LA MIX: PTT-LA Mix: 43.9 s — ABNORMAL HIGH (ref 0.0–40.5)

## 2021-10-22 LAB — HEXAGONAL PHASE PHOSPHOLIPID: Hexagonal Phase Phospholipid: 5 s (ref 0–11)

## 2021-10-22 LAB — LUPUS ANTICOAGULANT PANEL
DRVVT: 36.8 s (ref 0.0–47.0)
PTT Lupus Anticoagulant: 51 s — ABNORMAL HIGH (ref 0.0–43.5)

## 2021-10-22 LAB — PROTEIN C ACTIVITY: Protein C Activity: 101 % (ref 73–180)

## 2021-10-22 LAB — PROTEIN C, TOTAL: Protein C, Total: 78 % (ref 60–150)

## 2021-10-22 NOTE — Discharge Summary (Signed)
Stroke Discharge Summary  ?Patient ID: Christopher Pugh    l ?  MRN: [id number]    ?  DOB: 1971-02-24 ? ?Date of Admission: 10/19/2021 ?Date of Discharge: 10/22/2021 ? ?Attending Physician:  No att. providers found, Stroke MD ?Consultant(s):     interventional radiology   ?Patient's PCP:  Chesley Noon, MD ? ?DISCHARGE DIAGNOSIS:  ?Principal Problem: ?  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 ?Active Problems: ?  BPH without obstruction/lower urinary tract symptoms ?  Hypogonadism in male ?  Hx of C1 cervical fracture ([place]) ?  HLD ? ? ?Allergies as of 10/21/2021   ?No Known Allergies ?  ? ?  ?Medication List  ?  ? ?STOP taking these medications   ? ?testosterone cypionate 200 MG/ML injection ?Commonly known as: DEPOTESTOSTERONE CYPIONATE ?  ? ?  ? ?TAKE these medications   ? ?Aspirin Low Dose 81 MG EC tablet ?Generic drug: aspirin ?Take 1 tablet (81 mg total) by mouth daily. Swallow whole. ?  ?clopidogrel 75 MG tablet ?Commonly known as: PLAVIX ?Take 1 tablet (75 mg total) by mouth daily. ?  ?D3 PO ?Take 1 capsule by mouth daily. ?  ?ibuprofen 200 MG tablet ?Commonly known as: ADVIL ?Take 600 mg by mouth every 6 (six) hours as needed for headache or mild pain. ?  ?multivitamin tablet ?Take 1 tablet by mouth daily. ?  ?rosuvastatin 20 MG tablet ?Commonly known as: CRESTOR ?Take 1 tablet (20 mg total) by mouth daily. ?  ?tadalafil 5 MG tablet ?Commonly known as: CIALIS ?Take 5 mg by mouth daily. ?  ? ?  ? ? ?LABORATORY STUDIES ?CBC ?   ?Component Value Date/Time  ? WBC 5.3 10/21/2021 0402  ? RBC 4.10 (L) 10/21/2021 0402  ? HGB 13.2 10/21/2021 0402  ? HCT 37.7 (L) 10/21/2021 0402  ? PLT 210 10/21/2021 0402  ? MCV 92.0 10/21/2021 0402  ? MCH 32.2 10/21/2021 0402  ? MCHC 35.0 10/21/2021 0402  ? RDW 11.8 10/21/2021 0402  ? LYMPHSABS 0.7  10/19/2021 0740  ? MONOABS 0.5 10/19/2021 0740  ? EOSABS 0.0 10/19/2021 0740  ? BASOSABS 0.0 10/19/2021 0740  ? ?CMP ?   ?Component Value Date/Time  ? NA 137 10/21/2021 0402  ? K 3.9 10/21/2021 0402  ? CL 108 10/21/2021 0402  ? CO2 23 10/21/2021 0402  ? GLUCOSE 106 (H) 10/21/2021 0402  ? BUN 12 10/21/2021 0402  ? CREATININE 1.05 10/21/2021 0402  ? CALCIUM 8.8 (L) 10/21/2021 0402  ? PROT 6.3 (L) 10/19/2021 0740  ? ALBUMIN 4.0 10/19/2021 0740  ? AST 27 10/19/2021 0740  ? ALT 23 10/19/2021 0740  ? ALKPHOS 42 10/19/2021 0740  ? BILITOT 1.5 (H) 10/19/2021 0740  ? GFRNONAA >60 10/21/2021 0402  ? ?COAGS ?Lab Results  ?Component Value Date  ? INR 1.0 10/19/2021  ? ?Lipid Panel ?   ?Component Value Date/Time  ? CHOL 193 10/19/2021 1120  ? TRIG 34 10/19/2021 1120  ? HDL 54 10/19/2021 1120  ? CHOLHDL 3.6 10/19/2021 1120  ? VLDL 7 10/19/2021 1120  ? LDLCALC 132 (H) 10/19/2021 1120  ? ?HgbA1C  ?Lab Results  ?Component Value Date  ? HGBA1C 5.3 10/19/2021  ? ?Urinalysis ?No results found for: COLORURINE, APPEARANCEUR, [place], [place], [place], [place], [place], KETONESUR, PROTEINUR, [place], NITRITE, LEUKOCYTESUR ?Urine Drug Screen No results found  for: LABOPIA, COCAINSCRNUR, [place], [place], THCU, LABBARB  ?Alcohol Level ?No results found for: ETH ? ? ?SIGNIFICANT DIAGNOSTIC STUDIES ?CT HEAD WO CONTRAST ? ?Result Date: 10/19/2021 ?CLINICAL DATA:  Dizziness, aphasia. EXAM: CT HEAD WITHOUT CONTRAST TECHNIQUE: Contiguous axial images were obtained from the base of the skull through the vertex without intravenous contrast. RADIATION DOSE REDUCTION: This exam was performed according to the departmental dose-optimization program which includes automated exposure control, adjustment of the mA and/or kV according to patient size and/or use of iterative reconstruction technique. COMPARISON:  Outside head CT and cervical spine CT dated 04/01/2020. FINDINGS: Brain: Patchy low-density areas within the cerebellum, largest within the  LEFT cerebellum measuring 2.2 cm, suggesting subacute to chronic infarcts. No mass, hemorrhage, edema or other evidence of acute parenchymal abnormality is seen within the supratentorial brain. No midline shift or evidence of herniation. Vascular: No hyperdense vessel or unexpected calcification. Skull: Normal. Negative for fracture or focal lesion. Sinuses/Orbits: Fluid level within the RIGHT maxillary sinus. Remainder of the visualized paranasal sinuses are clear. Periorbital and retro-orbital soft tissues are unremarkable. Other: Displaced/comminuted fractures of the C1 vertebral body are incompletely imaged but are similar to the visualized portion of the upper cervical spine as seen on outside head CT of 04/01/2020, and presumably chronic. IMPRESSION: 1. Patchy low-density areas within the cerebellum, largest within the LEFT cerebellum measuring 2.2 cm, most likely subacute to chronic infarcts, possibly sequela of a previous trauma, underlying neoplastic process not excluded. Consider brain MRI with contrast for further characterization. 2. No intracranial hemorrhage. 3. Displaced/comminuted fractures of the C1 vertebral body, incompletely imaged but visualized portion similar to the fractures seen on cervical spine CT of 04/01/2020. Recommend cervical spine CT to compared directly with the earlier outside CT and consider surgical consultation to evaluate for instability and/or possible need for surgical fixation. 4. Fluid level within the RIGHT maxillary sinus, suggesting acute sinusitis. Critical Value/emergent results were called by telephone at the time of interpretation on 10/19/2021 at 8:20 am to provider [facility] , who verbally acknowledged these results. Electronically Signed   By: Franki Cabot M.D.   On: 10/19/2021 08:22  ? ?MR ANGIO HEAD WO CONTRAST ? ?Result Date: 10/21/2021 ?CLINICAL DATA:  Stroke, follow up; post mechanical thrombectomy for basilar artery occlusion EXAM: MRI HEAD WITHOUT  CONTRAST MRA HEAD WITHOUT CONTRAST TECHNIQUE: Multiplanar, multi-echo pulse sequences of the brain and surrounding structures were acquired without intravenous contrast. Angiographic images of the Circle of Willis were acquired using MRA technique without intravenous contrast. COMPARISON:  10/19/2021 FINDINGS: MRI HEAD DWI and sagittal T1 sequences obtained. Acute bilateral cerebellar infarcts are again identified. Extent of involvement is mildly greater. No significant mass effect. No hydrocephalus. MRA HEAD Intracranial internal carotid arteries are patent. Middle and anterior cerebral arteries are patent. Intracranial vertebral arteries are patent. Basilar artery is patent. Patent left PICA and AICA origins identified. Patent bilateral SCA origins. Bilateral posterior communicating arteries are present. Patent bilateral posterior cerebral arteries with flow primarily from the posterior communicating arteries. IMPRESSION: Acute bilateral cerebellar infarcts with mild increase in extent of involvement. No significant mass effect. Patent vertebral and basilar arteries. Electronically Signed   By: Macy Mis M.D.   On: 10/21/2021 11:26  ? ?MR BRAIN WO CONTRAST ? ?Result Date: 10/21/2021 ?CLINICAL DATA:  Stroke, follow up; post mechanical thrombectomy for basilar artery occlusion EXAM: MRI HEAD WITHOUT CONTRAST MRA HEAD WITHOUT CONTRAST TECHNIQUE: Multiplanar, multi-echo pulse sequences of the brain and surrounding structures were acquired without intravenous contrast. Angiographic  images of the Circle of Willis were acquired using MRA technique without intravenous contrast. COMPARISON:  10/19/2021 FINDINGS: MRI HEAD DWI and sagittal T1 sequences obtained. Acute bilateral cerebellar infarcts are again identified. Extent of involvement is mildly greater. No significant mass effect. No hydrocephalus. MRA HEAD Intracranial internal carotid arteries are patent. Middle and anterior cerebral arteries are patent.  Intracranial vertebral arteries are patent. Basilar artery is patent. Patent left PICA and AICA origins identified. Patent bilateral SCA origins. Bilateral posterior communicating arteries are present. Patent bilater

## 2021-10-23 ENCOUNTER — Ambulatory Visit: Payer: BC Managed Care – PPO | Attending: Nurse Practitioner

## 2021-10-23 VITALS — BP 142/102 | HR 73

## 2021-10-23 DIAGNOSIS — I639 Cerebral infarction, unspecified: Secondary | ICD-10-CM | POA: Diagnosis not present

## 2021-10-23 DIAGNOSIS — R2681 Unsteadiness on feet: Secondary | ICD-10-CM | POA: Diagnosis not present

## 2021-10-23 DIAGNOSIS — R42 Dizziness and giddiness: Secondary | ICD-10-CM | POA: Insufficient documentation

## 2021-10-23 DIAGNOSIS — R262 Difficulty in walking, not elsewhere classified: Secondary | ICD-10-CM | POA: Diagnosis present

## 2021-10-23 DIAGNOSIS — R2689 Other abnormalities of gait and mobility: Secondary | ICD-10-CM | POA: Diagnosis present

## 2021-10-23 NOTE — Therapy (Addendum)
?OUTPATIENT PHYSICAL THERAPY NEURO EVALUATION ? ? ?Patient Name: Christopher Pugh ?MRN: [id number] ?DOB:31-May-1971, 51 y.o., male ?Today's Date: 10/26/2021 ? ?PCP: Chesley Noon, MD ?REFERRING PROVIDER: Carron Curie, Cortney E,* ? ? ? 10/23/21 1015  ?PT Visits / Re-Eval  ?Visit Number 1  ?Number of Visits 13  ?Date for PT Re-Evaluation 12/04/21  ?Authorization  ?Authorization Type BCBS  ?PT Time Calculation  ?PT Start Time 1015  ?PT Stop Time 1100  ?PT Time Calculation (min) 45 min  ?PT - End of Session  ?Activity Tolerance Patient tolerated treatment well  ?Behavior During Therapy [facility] for tasks assessed/performed  ? ? ? ?Past Medical History:  ?Diagnosis Date  ? BPH without obstruction/lower urinary tract symptoms   ? CVA (cerebral vascular accident) ([place]) 10/19/2021  ? Hypogonadism in male   ? ?Past Surgical History:  ?Procedure Laterality Date  ? IR CT HEAD LTD  10/19/2021  ? IR PERCUTANEOUS ART THROMBECTOMY/INFUSION INTRACRANIAL INC DIAG ANGIO  10/19/2021  ? IR US GUIDE VASC ACCESS RIGHT  10/19/2021  ? RADIOLOGY WITH ANESTHESIA N/A 10/19/2021  ? Procedure: IR WITH ANESTHESIA;  Surgeon: Radiologist, Medication, MD;  Location: [place];  Service: Radiology;  Laterality: N/A;  ? ?Patient Active Problem List  ? Diagnosis Date Noted  ? Acute CVA (cerebrovascular accident) ([place]) 10/19/2021  ? BPH without obstruction/lower urinary tract symptoms 10/19/2021  ? Hypogonadism in male 10/19/2021  ? Alcohol abuse 10/19/2021  ? C1 cervical fracture ([place]) 10/19/2021  ? ? ?ONSET DATE: 10/19/2021 ? ?REFERRING DIAG: I63.9 (ICD-10-CM) - Acute CVA (cerebrovascular accident) ([place]) ? ?THERAPY DIAG:  ?Unsteadiness on feet - Plan: PT plan of care cert/re-cert ? ?Difficulty in walking, not elsewhere classified - Plan: PT plan of care cert/re-cert ? ?Dizziness and giddiness - Plan: PT plan of care cert/re-cert ? ?Other abnormalities of gait and mobility - Plan: PT plan of care cert/re-cert ? ?SUBJECTIVE:  ?                                                                                                                                                                                            ? ?SUBJECTIVE STATEMENT: ?Pt presented to ED with dizziness, nausea, ringing and hearing loss. 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 Underwent thrombectomy 4/17.  Patient reports that the ringing of the ears has improved significantly. Reports its mainly the dizziness. Reports having it more when he is standing and walking. Reports he notices he is veering toward the right. No nausea/vomiting. Reports slow controlled movements, rest, dark rooms. Patient reports some unsteadiness with descending or walking down incline. Reports some depth perceptions issues. No double vision.  Patient reports history of HA's and neck pain due to C1 burst Fx. Potential for a small dissection due to prior C1 burst or frequent neck movement.  ?Pt accompanied by:  Wife ([place]) ? ?PERTINENT HISTORY: BPH, hypogonadism, C1 Burst Fx  ? ?PAIN:  ?Are you having pain?  Still some soreness from the  ? ?PRECAUTIONS: Other: No lifting, No work for 2-3 weeks. ? ?WEIGHT BEARING RESTRICTIONS No ? ?FALLS: Has patient fallen in last 6 months? No ? ?LIVING ENVIRONMENT: ?Lives with: lives with their spouse ?Lives in: House/apartment; Apartment (three flights of stairs) ?Stairs: Yes: Internal: 24 steps; on right going up ?Has following equipment at home: None ? ?PLOF: Vocation/Vocational requirements: Journalist, newspaper at [employer] ; Run 4-5 miles/week couple times a week ? ?PATIENT GOALS : Get back to riding back; Get Back to Crossfit; Get Back to Running  ? ?OBJECTIVE:  ? ?COGNITION: ?Overall cognitive status: Within functional limits for tasks assessed ?  ?SENSATION: ?WFL ? ?COORDINATION: ? ? ?MUSCLE TONE: No Significant Tone ? ? ?POSTURE: No Significant postural limitations ? ?MMT:   ? ?MMT Right ?10/23/2021 Left ?10/23/2021  ?Hip flexion 5/5 5/5  ?Hip extension    ?Hip abduction    ?Hip adduction    ?Hip  internal rotation    ?Hip external rotation    ?Knee flexion 5/5 5/5  ?Knee extension 5/5 5/5  ?Ankle dorsiflexion 5/5 5/5  ?Ankle plantarflexion    ?Ankle inversion    ?Ankle eversion    ?(Blank rows = not tested) ? ? ? ?TRANSFERS: ?Assistive device utilized: None  ?Sit to stand: SBA ?Stand to sit: SBA ? ?STAIRS: ? Level of Assistance: SBA ? Stair Negotiation Technique: Alternating Pattern  with Single Rail on Right ? Number of Stairs: 4  ? Height of Stairs: 6 ?Comments: mild unsteadiness noted with descent, reports dizziness with turn ? ?GAIT: ?Gait pattern: step through pattern and wide BOS ?Distance walked: 115 ?Assistive device utilized: None ?Level of assistance: SBA ?Comments: mild unsteadiness with ambulation and veering to the R intermittently ?Gait Speed: 8.03 seconds = 4.08 ft/sec ? ?FUNCTIONAL TESTs:  ?30 seconds chair stand test; 22 reps without UE support ?M-CTSIB: able to complete situation 1-3 for full 30 seconds; situation 4 on average: 6.4 seconds ? ? ? ?   ? ? ?   ?PATIENT SURVEYS:  ?FOTO Stroke LE: 59% ? ? ?PATIENT EDUCATION: ?Education details: Educated on POC/Eval Findings ?Person educated: Patient and Spouse ?Education method: Explanation ?Education comprehension: verbalized understanding ? ? ?HOME EXERCISE PROGRAM: ?To Be Established ? ? ? ?GOALS: ?Goals reviewed with patient? Yes ? ?SHORT TERM GOALS: Target date: 11/13/2021 ? ?Pt will be independent with initial HEP for improved balance  ?Baseline: to be established ?Goal status: INITIAL ? ?2.  Pt will improve FGA to >/= 19/30 to demonstrate improved balance and reduced fall risk ?Baseline: 16/30 ?Goal status: INITIAL ? ?3.  Pt will hold situation 4 of M-CTSIB for >/= 10 seconds ?Baseline: 6.4 seconds ?Goal status: INITIAL ? ? ?LONG TERM GOALS: Target date: 12/04/2021 ? ?Pt will be independent with final HEP for improved balance  ?Baseline: no HEP established ?Goal status: INITIAL ? ?2.  Pt will improve FGA to >/= 23/30 to demonstrate improved  balance and reduced fall risk ?Baseline: 16/30 ?Goal status: INITIAL ? ?3.  Pt will improve M-CTSIB to >/= 20 seconds to demo improved balance ?Baseline: 6.4 seconds ?Goal status: INITIAL ? ?4.  Patient will be able to ascend/descend x 24 stairs with alternating  pattern and no rail to demo improved entry into home ?Baseline: single rail on R ?Goal status: INITIAL ? ?5.  Patient will be able to jog 25'  without imbalance  ?Baseline: not currently completing ?Goal status: INITIAL ? ?6.  Pt will improve FOTO to >/= 70% ?Baseline: 59% ?Goal status: INITIAL ? ?ASSESSMENT: ? ?CLINICAL IMPRESSION: ?Patient is a 51 y.o.male referred to Neuro OPPT services for Cerebellar CVA. Patient's PMH significant for the following: BPH, hypogonadism, C1 Burst Fx . Upon evaluation, patient presents with the following impairments: abnormal gait, impaired balance, dizziness, decreased activity tolerance, abnormal oculomotor exam. Pt is at elevated fall risk with 16/30 on FGA. Patient is currently ambulating at 4.08 ft/sec. Patient will benefit from skilled PT services to address impairments.  ? ? ? ?OBJECTIVE IMPAIRMENTS Abnormal gait, decreased activity tolerance, decreased balance, difficulty walking, decreased safety awareness, and dizziness.  ? ?ACTIVITY LIMITATIONS community activity, driving, occupation, and yard work.  ? ?PERSONAL FACTORS 1-2 comorbidities: BPH, hypogonadism, C1 Burst Fx   are also affecting patient's functional outcome.  ? ? ?REHAB POTENTIAL: Excellent ? ?CLINICAL DECISION MAKING: Stable/uncomplicated ? ?EVALUATION COMPLEXITY: Low ? ?PLAN: ?PT FREQUENCY: 2x/week ? ?PT DURATION: 6 weeks ? ?PLANNED INTERVENTIONS: Therapeutic exercises, Therapeutic activity, Neuromuscular re-education, Balance training, Gait training, Patient/Family education, Joint mobilization, Stair training, Vestibular training, DME instructions, Aquatic Therapy, Electrical stimulation, Cryotherapy, Moist heat, and Manual therapy ? ?PLAN FOR NEXT  SESSION: Monitor BP. Review VOR. Establish Balance HEP (eyes closed/head turns). High level balance.  ? ? ?Jones Bales, PT, DPT ?10/26/2021, 8:56 AM ? ? ? ? ? ? ? ?

## 2021-10-26 ENCOUNTER — Ambulatory Visit: Payer: BC Managed Care – PPO

## 2021-10-26 DIAGNOSIS — R2681 Unsteadiness on feet: Secondary | ICD-10-CM

## 2021-10-26 DIAGNOSIS — R262 Difficulty in walking, not elsewhere classified: Secondary | ICD-10-CM

## 2021-10-26 DIAGNOSIS — R42 Dizziness and giddiness: Secondary | ICD-10-CM

## 2021-10-26 DIAGNOSIS — R2689 Other abnormalities of gait and mobility: Secondary | ICD-10-CM

## 2021-10-26 LAB — FACTOR 5 LEIDEN

## 2021-10-26 LAB — PROTHROMBIN GENE MUTATION

## 2021-10-26 NOTE — Patient Instructions (Addendum)
Gaze Stabilization: Standing Feet Apart (Compliant Surface) ? ? ? ?Feet apart on pillow, keeping eyes on target on wall 3-4 feet away, tilt head down 15-30? and move head side to side for 30 seconds. Repeat while moving head up and down for 30 seconds. ?Do 2-3 sessions per day. ? ?Copyright ? VHI. All rights reserved.  ? ?Access Code: [email or web address] ?URL: [email or web address] ?Date: 10/26/2021 ?Prepared by: Baldomero Lamy ? ?Exercises ?- Standing Balance with Eyes Closed on Pillow  - 1 x daily - 7 x weekly - 1 sets - 3 reps - 30 seconds hold ?- Walking with Head Rotation  - 1 x daily - 7 x weekly - 1 sets - 4 reps ?- Standing on Foam with eyes Closed + Head Turns/Nods  - 1 x daily - 7 x weekly - 2 sets - 10 reps ? ?

## 2021-10-26 NOTE — Therapy (Signed)
?OUTPATIENT PHYSICAL THERAPY TREATMENT NOTE ? ? ?Patient Name: Christopher Pugh ?MRN: [id number] ?DOB:04/03/1971, 51 y.o., male ?Today's Date: 10/26/2021 ? ?PCP: Chesley Noon, MD ?REFERRING PROVIDER: Katy Apo, NP ? ? PT End of Session - 10/26/21 3335   ? ? Visit Number 2   ? Number of Visits 13   ? Date for PT Re-Evaluation 12/04/21   ? Authorization Type BCBS   ? PT Start Time [phone]   ? PT Stop Time 0801   ? PT Time Calculation (min) 43 min   ? Activity Tolerance Patient tolerated treatment well   ? Behavior During Therapy [facility] for tasks assessed/performed   ? ?  ?  ? ?  ? ? ?Past Medical History:  ?Diagnosis Date  ? BPH without obstruction/lower urinary tract symptoms   ? CVA (cerebral vascular accident) ([place]) 10/19/2021  ? Hypogonadism in male   ? ?Past Surgical History:  ?Procedure Laterality Date  ? IR CT HEAD LTD  10/19/2021  ? IR PERCUTANEOUS ART THROMBECTOMY/INFUSION INTRACRANIAL INC DIAG ANGIO  10/19/2021  ? IR US GUIDE VASC ACCESS RIGHT  10/19/2021  ? RADIOLOGY WITH ANESTHESIA N/A 10/19/2021  ? Procedure: IR WITH ANESTHESIA;  Surgeon: Radiologist, Medication, MD;  Location: [place];  Service: Radiology;  Laterality: N/A;  ? ?Patient Active Problem List  ? Diagnosis Date Noted  ? Acute CVA (cerebrovascular accident) ([place]) 10/19/2021  ? BPH without obstruction/lower urinary tract symptoms 10/19/2021  ? Hypogonadism in male 10/19/2021  ? Alcohol abuse 10/19/2021  ? C1 cervical fracture ([place]) 10/19/2021  ? ? ?REFERRING DIAG: I63.9 (ICD-10-CM) - Acute CVA (cerebrovascular accident) ([place]) ? ?THERAPY DIAG:  ?Unsteadiness on feet ? ?Difficulty in walking, not elsewhere classified ? ?Dizziness and giddiness ? ?Other abnormalities of gait and mobility ? ?PERTINENT HISTORY: BPH, hypogonadism, C1 Burst Fx  ?  ? ?PRECAUTIONS: Other: No lifting, No work for 2-3 weeks. ? ?SUBJECTIVE: Patient reports felt like the dizziness has been increased over the weekend, but feels like balance is better.  ? ?PAIN:  ?Are you  having pain? No ? ?BP: 130/90, HR: 61 ?BP after activities: 137/98, HR: 65 bpm ? ?OBJECTIVE:  ? ?Gaze Stabilization:  ?Horizontal VOR: completed narrow BOS on firm surface, and bil stance on foam, completed 2 reps x 30 seconds. Mild - mod dizziness.  ?Vertical VOR: completed narrow BOS on firm surface, and bil stance on foam, completed 2 reps x 30 seconds. Mild - mod dizziness ? ?Added to HEP:  ?Gaze Stabilization: Standing Feet Apart (Compliant Surface) ? ? ? ?Feet apart on pillow, keeping eyes on target on wall 3-4 feet away, tilt head down 15-30? and move head side to side for 30 seconds. Repeat while moving head up and down for 30 seconds. ?Do 2-3 sessions per day. ? ?NMR: ?Established the following HEP during session:  ?Access Code: [email or web address] ?URL: [email or web address] ?Date: 10/26/2021 ?Prepared by: Baldomero Lamy ? ?Exercises ?- Standing Balance with Eyes Closed on Pillow  - 1 x daily - 7 x weekly - 1 sets - 3 reps - 30 seconds hold ?- Walking with Head Rotation  - 1 x daily - 7 x weekly - 1 sets - 4 reps ?- Standing on Foam with eyes Closed + Head Turns/Nods  - 1 x daily - 7 x weekly - 2 sets - 10 reps ? ? ?Completed Visual Tracking with Diona Foley, completed 2 x 115' with ambulation with self ball toss with visual tracking, intermittent looks away to find stabilization point.  Increased challenge noted requiring CGA. ? ?Standing on Inverted BOSU: completed with bil stance, holding steady 2 x 30 seconds. Then transitioning to completing squats without UE support, completed x 10 reps. CGA.  ? ?PATIENT EDUCATION: ?Education details: Initial HEP ?Person educated: Patient and Spouse ?Education method: Explanation; Handout ?Education comprehension: Verbalized Understanding ?  ?  ?HOME EXERCISE PROGRAM: ?Access Code: [email or web address] ?  ?  ?  ?GOALS: ?Goals reviewed with patient? Yes ?  ?SHORT TERM GOALS: Target date: 11/13/2021 ?  ?Pt will be independent with initial HEP for improved balance  ?Baseline: to be  established ?Goal status: INITIAL ?  ?2.  Pt will improve FGA to >/= 19/30 to demonstrate improved balance and reduced fall risk ?Baseline: 16/30 ?Goal status: INITIAL ?  ?3.  Pt will hold situation 4 of M-CTSIB for >/= 10 seconds ?Baseline: 6.4 seconds ?Goal status: INITIAL ?  ?  ?LONG TERM GOALS: Target date: 12/04/2021 ?  ?Pt will be independent with final HEP for improved balance  ?Baseline: no HEP established ?Goal status: INITIAL ?  ?2.  Pt will improve FGA to >/= 23/30 to demonstrate improved balance and reduced fall risk ?Baseline: 16/30 ?Goal status: INITIAL ?  ?3.  Pt will improve M-CTSIB to >/= 20 seconds to demo improved balance ?Baseline: 6.4 seconds ?Goal status: INITIAL ?  ?4.  Patient will be able to ascend/descend x 24 stairs with alternating pattern and no rail to demo improved entry into home ?Baseline: single rail on R ?Goal status: INITIAL ?  ?5.  Patient will be able to jog 25'  without imbalance  ?Baseline: not currently completing ?Goal status: INITIAL ?  ?6.  Pt will improve FOTO to >/= 70% ?Baseline: 59% ?Goal status: INITIAL ?  ?ASSESSMENT: ?  ?CLINICAL IMPRESSION: ?Today's skilled PT session included progression of VOR to standing on complaint to further challenge. Continued rest of session focused on establishing initial HEP focused on complaint surfaces and eyes closed to further challenge balance. Patient tolerating activities well. Vitals stable. Will continue per POC.  ?  ?  ?OBJECTIVE IMPAIRMENTS Abnormal gait, decreased activity tolerance, decreased balance, difficulty walking, decreased safety awareness, and dizziness.  ?  ?ACTIVITY LIMITATIONS community activity, driving, occupation, and yard work.  ?  ?PERSONAL FACTORS 1-2 comorbidities: BPH, hypogonadism, C1 Burst Fx   are also affecting patient's functional outcome.  ?  ?  ?REHAB POTENTIAL: Excellent ?  ?CLINICAL DECISION MAKING: Stable/uncomplicated ?  ?EVALUATION COMPLEXITY: Low ?  ?PLAN: ?PT FREQUENCY: 2x/week ?  ?PT  DURATION: 6 weeks ?  ?PLANNED INTERVENTIONS: Therapeutic exercises, Therapeutic activity, Neuromuscular re-education, Balance training, Gait training, Patient/Family education, Joint mobilization, Stair training, Vestibular training, DME instructions, Aquatic Therapy, Electrical stimulation, Cryotherapy, Moist heat, and Manual therapy ?  ?PLAN FOR NEXT SESSION: Monitor BP. Continue VOR/visual tracking. Corner Balance. High level balance. ? ? ?Jones Bales, PT, DPT ?10/26/2021, 8:54 AM ? ?  ? ?

## 2021-10-28 ENCOUNTER — Ambulatory Visit: Payer: BC Managed Care – PPO

## 2021-10-28 VITALS — BP 133/98 | HR 67

## 2021-10-28 DIAGNOSIS — R2681 Unsteadiness on feet: Secondary | ICD-10-CM

## 2021-10-28 DIAGNOSIS — G459 Transient cerebral ischemic attack, unspecified: Secondary | ICD-10-CM | POA: Diagnosis not present

## 2021-10-28 DIAGNOSIS — R2689 Other abnormalities of gait and mobility: Secondary | ICD-10-CM

## 2021-10-28 DIAGNOSIS — R262 Difficulty in walking, not elsewhere classified: Secondary | ICD-10-CM

## 2021-10-28 DIAGNOSIS — I63541 Cerebral infarction due to unspecified occlusion or stenosis of right cerebellar artery: Secondary | ICD-10-CM | POA: Diagnosis not present

## 2021-10-28 DIAGNOSIS — R42 Dizziness and giddiness: Secondary | ICD-10-CM

## 2021-10-28 NOTE — Therapy (Signed)
?OUTPATIENT PHYSICAL THERAPY TREATMENT NOTE ? ? ?Patient Name: Christopher Pugh ?MRN: [id number] ?DOB:04/04/1971, 51 y.o., male ?Today's Date: 10/28/2021 ? ?PCP: Chesley Noon, MD ?REFERRING PROVIDER: Katy Apo, NP ? ? PT End of Session - 10/28/21 0713   ? ? Visit Number 3   ? Number of Visits 13   ? Date for PT Re-Evaluation 12/04/21   ? Authorization Type BCBS   ? PT Start Time 0715   ? PT Stop Time 0759   ? PT Time Calculation (min) 44 min   ? Activity Tolerance Patient tolerated treatment well   ? Behavior During Therapy [facility] for tasks assessed/performed   ? ?  ?  ? ?  ? ? ?Past Medical History:  ?Diagnosis Date  ? BPH without obstruction/lower urinary tract symptoms   ? CVA (cerebral vascular accident) ([place]) 10/19/2021  ? Hypogonadism in male   ? ?Past Surgical History:  ?Procedure Laterality Date  ? IR CT HEAD LTD  10/19/2021  ? IR PERCUTANEOUS ART THROMBECTOMY/INFUSION INTRACRANIAL INC DIAG ANGIO  10/19/2021  ? IR US GUIDE VASC ACCESS RIGHT  10/19/2021  ? RADIOLOGY WITH ANESTHESIA N/A 10/19/2021  ? Procedure: IR WITH ANESTHESIA;  Surgeon: Radiologist, Medication, MD;  Location: [place];  Service: Radiology;  Laterality: N/A;  ? ?Patient Active Problem List  ? Diagnosis Date Noted  ? Acute CVA (cerebrovascular accident) ([place]) 10/19/2021  ? BPH without obstruction/lower urinary tract symptoms 10/19/2021  ? Hypogonadism in male 10/19/2021  ? Alcohol abuse 10/19/2021  ? C1 cervical fracture ([place]) 10/19/2021  ? ? ?REFERRING DIAG: I63.9 (ICD-10-CM) - Acute CVA (cerebrovascular accident) ([place]) ? ?THERAPY DIAG:  ?Unsteadiness on feet ? ?Difficulty in walking, not elsewhere classified ? ?Dizziness and giddiness ? ?Other abnormalities of gait and mobility ? ?PERTINENT HISTORY: BPH, hypogonadism, C1 Burst Fx  ?  ? ?PRECAUTIONS: Other: No lifting, No work for 2-3 weeks. ? ?SUBJECTIVE: Patient reports no new changes. Woke up dizzy this morning. Reported that he is very frustrated right now because the dizziness has  increased.  ? ?PAIN:  ?Are you having pain? No ? ?Today's Vitals  ? 10/28/21 0720 10/28/21 0728 10/28/21 0756  ?BP: (!) 133/99 (!) 135/95 (!) 133/98  ?Pulse: 64 66 67  ? ?There is no height or weight on file to calculate BMI. ? ? ?OBJECTIVE:  ?Completed ambulation on treadmill at 2.7 mph, completed x 6 . After minute 4, added in intermittent head turns to R/L and Up/Down x 8 reps each. Reports small increase in dizziness with completion of head turn, then quick return to baseline.  ? ?On Blue Balance Beam completed lateral sidestepping with toe tap to cone anterior/posterior x 3 laps down and back. Increased challenge with posterior toe tap especially noted with SLS on RLE.  ? ?Completed tandem gait on blue balance beam, completed x 4 laps down and back without UE support.  ? ?On Rockerboard (positioned A/P): completed alternating step ups with toe tap to cone to further promote SLS, completed x 15 reps bilaterally, intermittent pauses to regain balance and intermittent UE support from // bars as needed.  ? ?Completed SLS on BLE on firm surface x 2 reps, RLE ranging from 20-25 seconds, LLE ranging from 25-30 seconds.  ? ?Standing on Inverted BOSU: completed with bil stance, holding steady x 30 seconds. Then transitioning to completing lateral weight shift without UE support x 15 reps, then anterior and posterior weight shift without UE support x 15 reps. On BOSU (blue  side up): completed alternating step up with opposite knee drive x 10 reps bilat, intermittent UE support required. CGA throughout.  ? ?Completed carioca 4 x 40', with progression of speed of movement. Mild dizziness reported.  ? ?BP monitored intermittently throughout session, diastolic elevated but no significant increased noted with physical activity.  ? ?PATIENT EDUCATION: ?Education details: Continue HEP ?Person educated: Patient and Spouse ?Education method: Explanation; Handout ?Education comprehension: Verbalized Understanding ?  ?  ?HOME  EXERCISE PROGRAM: ?Access Code: [email or web address] ?  ?  ?  ?GOALS: ?Goals reviewed with patient? Yes ?  ?SHORT TERM GOALS: Target date: 11/13/2021 ?  ?Pt will be independent with initial HEP for improved balance  ?Baseline: to be established ?Goal status: INITIAL ?  ?2.  Pt will improve FGA to >/= 19/30 to demonstrate improved balance and reduced fall risk ?Baseline: 16/30 ?Goal status: INITIAL ?  ?3.  Pt will hold situation 4 of M-CTSIB for >/= 10 seconds ?Baseline: 6.4 seconds ?Goal status: INITIAL ?  ?  ?LONG TERM GOALS: Target date: 12/04/2021 ?  ?Pt will be independent with final HEP for improved balance  ?Baseline: no HEP established ?Goal status: INITIAL ?  ?2.  Pt will improve FGA to >/= 23/30 to demonstrate improved balance and reduced fall risk ?Baseline: 16/30 ?Goal status: INITIAL ?  ?3.  Pt will improve M-CTSIB to >/= 20 seconds to demo improved balance ?Baseline: 6.4 seconds ?Goal status: INITIAL ?  ?4.  Patient will be able to ascend/descend x 24 stairs with alternating pattern and no rail to demo improved entry into home ?Baseline: single rail on R ?Goal status: INITIAL ?  ?5.  Patient will be able to jog 25'  without imbalance  ?Baseline: not currently completing ?Goal status: INITIAL ?  ?6.  Pt will improve FOTO to >/= 70% ?Baseline: 59% ?Goal status: INITIAL ?  ?ASSESSMENT: ?  ?CLINICAL IMPRESSION: ?Today's skilled PT session included continued progression of dynamic balance and SLS activities, working toward more complaint surfaces. Increased challenge with SLS on RLE >LLE noted. Will continue per POC.  ?  ?  ?OBJECTIVE IMPAIRMENTS Abnormal gait, decreased activity tolerance, decreased balance, difficulty walking, decreased safety awareness, and dizziness.  ?  ?ACTIVITY LIMITATIONS community activity, driving, occupation, and yard work.  ?  ?PERSONAL FACTORS 1-2 comorbidities: BPH, hypogonadism, C1 Burst Fx   are also affecting patient's functional outcome.  ?  ?  ?REHAB POTENTIAL: Excellent ?  ?CLINICAL  DECISION MAKING: Stable/uncomplicated ?  ?EVALUATION COMPLEXITY: Low ?  ?PLAN: ?PT FREQUENCY: 2x/week ?  ?PT DURATION: 6 weeks ?  ?PLANNED INTERVENTIONS: Therapeutic exercises, Therapeutic activity, Neuromuscular re-education, Balance training, Gait training, Patient/Family education, Joint mobilization, Stair training, Vestibular training, DME instructions, Aquatic Therapy, Electrical stimulation, Cryotherapy, Moist heat, and Manual therapy ?  ?PLAN FOR NEXT SESSION: Monitor BP. Continue tandem, SLS activities, corner Balance. High level balance. Dynamic gait with dual tasking. Visual tracking.  ? ? ?Jones Bales, PT, DPT ?10/28/2021, 8:11 AM ? ?  ? ?

## 2021-10-30 ENCOUNTER — Other Ambulatory Visit (HOSPITAL_COMMUNITY): Payer: Self-pay

## 2021-10-30 ENCOUNTER — Emergency Department (HOSPITAL_COMMUNITY): Payer: BC Managed Care – PPO

## 2021-10-30 ENCOUNTER — Encounter (HOSPITAL_COMMUNITY): Payer: Self-pay | Admitting: Internal Medicine

## 2021-10-30 ENCOUNTER — Other Ambulatory Visit: Payer: Self-pay

## 2021-10-30 ENCOUNTER — Observation Stay (HOSPITAL_COMMUNITY): Payer: BC Managed Care – PPO

## 2021-10-30 ENCOUNTER — Inpatient Hospital Stay (HOSPITAL_COMMUNITY)
Admission: EM | Admit: 2021-10-30 | Discharge: 2021-11-01 | DRG: 065 | Disposition: A | Discharge status: Home / Self Care | Payer: BC Managed Care – PPO | Attending: Internal Medicine | Admitting: Internal Medicine

## 2021-10-30 DIAGNOSIS — R29702 NIHSS score 2: Secondary | ICD-10-CM | POA: Diagnosis not present

## 2021-10-30 DIAGNOSIS — H9313 Tinnitus, bilateral: Secondary | ICD-10-CM | POA: Diagnosis present

## 2021-10-30 DIAGNOSIS — S12000A Unspecified displaced fracture of first cervical vertebra, initial encounter for closed fracture: Secondary | ICD-10-CM | POA: Diagnosis present

## 2021-10-30 DIAGNOSIS — I63541 Cerebral infarction due to unspecified occlusion or stenosis of right cerebellar artery: Principal | ICD-10-CM | POA: Diagnosis present

## 2021-10-30 DIAGNOSIS — H9190 Unspecified hearing loss, unspecified ear: Secondary | ICD-10-CM | POA: Diagnosis present

## 2021-10-30 DIAGNOSIS — N400 Enlarged prostate without lower urinary tract symptoms: Secondary | ICD-10-CM | POA: Diagnosis present

## 2021-10-30 DIAGNOSIS — E291 Testicular hypofunction: Secondary | ICD-10-CM | POA: Diagnosis present

## 2021-10-30 DIAGNOSIS — Z7902 Long term (current) use of antithrombotics/antiplatelets: Secondary | ICD-10-CM

## 2021-10-30 DIAGNOSIS — R29700 NIHSS score 0: Secondary | ICD-10-CM | POA: Diagnosis present

## 2021-10-30 DIAGNOSIS — H532 Diplopia: Secondary | ICD-10-CM | POA: Diagnosis present

## 2021-10-30 DIAGNOSIS — E785 Hyperlipidemia, unspecified: Secondary | ICD-10-CM | POA: Diagnosis present

## 2021-10-30 DIAGNOSIS — R2681 Unsteadiness on feet: Secondary | ICD-10-CM | POA: Diagnosis present

## 2021-10-30 DIAGNOSIS — Z87891 Personal history of nicotine dependence: Secondary | ICD-10-CM

## 2021-10-30 DIAGNOSIS — Z6828 Body mass index (BMI) 28.0-28.9, adult: Secondary | ICD-10-CM

## 2021-10-30 DIAGNOSIS — I6501 Occlusion and stenosis of right vertebral artery: Secondary | ICD-10-CM | POA: Diagnosis present

## 2021-10-30 DIAGNOSIS — I639 Cerebral infarction, unspecified: Secondary | ICD-10-CM

## 2021-10-30 DIAGNOSIS — R29818 Other symptoms and signs involving the nervous system: Secondary | ICD-10-CM

## 2021-10-30 DIAGNOSIS — Z79899 Other long term (current) drug therapy: Secondary | ICD-10-CM

## 2021-10-30 DIAGNOSIS — Z7982 Long term (current) use of aspirin: Secondary | ICD-10-CM

## 2021-10-30 DIAGNOSIS — E669 Obesity, unspecified: Secondary | ICD-10-CM | POA: Diagnosis present

## 2021-10-30 DIAGNOSIS — G459 Transient cerebral ischemic attack, unspecified: Secondary | ICD-10-CM | POA: Diagnosis not present

## 2021-10-30 DIAGNOSIS — R519 Headache, unspecified: Secondary | ICD-10-CM | POA: Diagnosis present

## 2021-10-30 DIAGNOSIS — Z20822 Contact with and (suspected) exposure to covid-19: Secondary | ICD-10-CM | POA: Diagnosis present

## 2021-10-30 DIAGNOSIS — S1201XA Stable burst fracture of first cervical vertebra, initial encounter for closed fracture: Secondary | ICD-10-CM | POA: Diagnosis present

## 2021-10-30 DIAGNOSIS — Z8673 Personal history of transient ischemic attack (TIA), and cerebral infarction without residual deficits: Secondary | ICD-10-CM

## 2021-10-30 DIAGNOSIS — Z823 Family history of stroke: Secondary | ICD-10-CM

## 2021-10-30 DIAGNOSIS — Z8249 Family history of ischemic heart disease and other diseases of the circulatory system: Secondary | ICD-10-CM

## 2021-10-30 HISTORY — DX: Other specified health status: Z78.9

## 2021-10-30 LAB — CBC
HCT: 41.2 % (ref 39.0–52.0)
HCT: 38.2 % — ABNORMAL LOW (ref 39.0–52.0)
Hemoglobin: 15.2 g/dL (ref 13.0–17.0)
Hemoglobin: 13.8 g/dL (ref 13.0–17.0)
MCH: 33 pg (ref 26.0–34.0)
MCH: 32.8 pg (ref 26.0–34.0)
MCHC: 36.9 g/dL — ABNORMAL HIGH (ref 30.0–36.0)
MCHC: 36.1 g/dL — ABNORMAL HIGH (ref 30.0–36.0)
MCV: 89.6 fL (ref 80.0–100.0)
MCV: 90.7 fL (ref 80.0–100.0)
Platelets: 241 10*3/uL (ref 150–400)
Platelets: 218 10*3/uL (ref 150–400)
RBC: 4.6 MIL/uL (ref 4.22–5.81)
RBC: 4.21 MIL/uL — ABNORMAL LOW (ref 4.22–5.81)
RDW: 11.9 % (ref 11.5–15.5)
RDW: 11.8 % (ref 11.5–15.5)
WBC: 6.7 10*3/uL (ref 4.0–10.5)
WBC: 6.2 10*3/uL (ref 4.0–10.5)
nRBC: 0 % (ref 0.0–0.2)
nRBC: 0 % (ref 0.0–0.2)

## 2021-10-30 LAB — I-STAT CHEM 8, ED
BUN: 27 mg/dL — ABNORMAL HIGH (ref 6–20)
Calcium, Ion: 1.16 mmol/L (ref 1.15–1.40)
Chloride: 104 mmol/L (ref 98–111)
Creatinine, Ser: 1.3 mg/dL — ABNORMAL HIGH (ref 0.61–1.24)
Glucose, Bld: 105 mg/dL — ABNORMAL HIGH (ref 70–99)
HCT: 43 % (ref 39.0–52.0)
Hemoglobin: 14.6 g/dL (ref 13.0–17.0)
Potassium: 4 mmol/L (ref 3.5–5.1)
Sodium: 138 mmol/L (ref 135–145)
TCO2: 23 mmol/L (ref 22–32)

## 2021-10-30 LAB — CBG MONITORING, ED: Glucose-Capillary: 104 mg/dL — ABNORMAL HIGH (ref 70–99)

## 2021-10-30 LAB — COMPREHENSIVE METABOLIC PANEL
ALT: 36 U/L (ref 0–44)
AST: 33 U/L (ref 15–41)
Albumin: 4.6 g/dL (ref 3.5–5.0)
Alkaline Phosphatase: 43 U/L (ref 38–126)
Anion gap: 8 (ref 5–15)
BUN: 25 mg/dL — ABNORMAL HIGH (ref 6–20)
CO2: 23 mmol/L (ref 22–32)
Calcium: 9.9 mg/dL (ref 8.9–10.3)
Chloride: 105 mmol/L (ref 98–111)
Creatinine, Ser: 1.25 mg/dL — ABNORMAL HIGH (ref 0.61–1.24)
GFR, Estimated: >60 mL/min (ref >60)
Glucose, Bld: 105 mg/dL — ABNORMAL HIGH (ref 70–99)
Potassium: 3.9 mmol/L (ref 3.5–5.1)
Sodium: 136 mmol/L (ref 135–145)
Total Bilirubin: 0.9 mg/dL (ref 0.3–1.2)
Total Protein: 7.2 g/dL (ref 6.5–8.1)

## 2021-10-30 LAB — RESP PANEL BY RT-PCR (FLU A&B, COVID) ARPGX2
Influenza A by PCR: NEGATIVE
Influenza B by PCR: NEGATIVE
SARS Coronavirus 2 by RT PCR: NEGATIVE

## 2021-10-30 LAB — PROTIME-INR
INR: 1 (ref 0.8–1.2)
Prothrombin Time: 12.7 seconds (ref 11.4–15.2)

## 2021-10-30 LAB — URINALYSIS, ROUTINE W REFLEX MICROSCOPIC
Bilirubin Urine: NEGATIVE
Glucose, UA: NEGATIVE mg/dL
Hgb urine dipstick: NEGATIVE
Ketones, ur: NEGATIVE mg/dL
Leukocytes,Ua: NEGATIVE
Nitrite: NEGATIVE
Protein, ur: NEGATIVE mg/dL
Specific Gravity, Urine: 1.027 (ref 1.005–1.030)
pH: 5 (ref 5.0–8.0)

## 2021-10-30 LAB — DIFFERENTIAL
Abs Immature Granulocytes: 0.03 10*3/uL (ref 0.00–0.07)
Basophils Absolute: 0 10*3/uL (ref 0.0–0.1)
Basophils Relative: 1 %
Eosinophils Absolute: 0.1 10*3/uL (ref 0.0–0.5)
Eosinophils Relative: 1 %
Immature Granulocytes: 0 %
Lymphocytes Relative: 18 %
Lymphs Abs: 1.2 10*3/uL (ref 0.7–4.0)
Monocytes Absolute: 0.5 10*3/uL (ref 0.1–1.0)
Monocytes Relative: 7 %
Neutro Abs: 4.9 10*3/uL (ref 1.7–7.7)
Neutrophils Relative %: 73 %

## 2021-10-30 LAB — CREATININE, SERUM
Creatinine, Ser: 1.3 mg/dL — ABNORMAL HIGH (ref 0.61–1.24)
GFR, Estimated: >60 mL/min (ref >60)

## 2021-10-30 LAB — RAPID URINE DRUG SCREEN, HOSP PERFORMED
Amphetamines: NOT DETECTED
Barbiturates: NOT DETECTED
Benzodiazepines: NOT DETECTED
Cocaine: NOT DETECTED
Opiates: NOT DETECTED
Tetrahydrocannabinol: NOT DETECTED

## 2021-10-30 LAB — APTT: aPTT: 28 seconds (ref 24–36)

## 2021-10-30 LAB — ETHANOL: Alcohol, Ethyl (B): <10 mg/dL (ref <10)

## 2021-10-30 IMAGING — CT CT HEAD CODE STROKE
3 of 4 series · 14 of 47 positions shown, 16 images · non-contrast
Comparison: Recent CT and MR imaging

CLINICAL DATA: Code stroke.  Neuro deficit, acute, stroke suspected



[Series 4: head 2.0 h70h · axial · 0.50mm/px · z∈[-86,+30]mm · 8 of 74 slices shown, 10 images]
[im 8/74  brain]
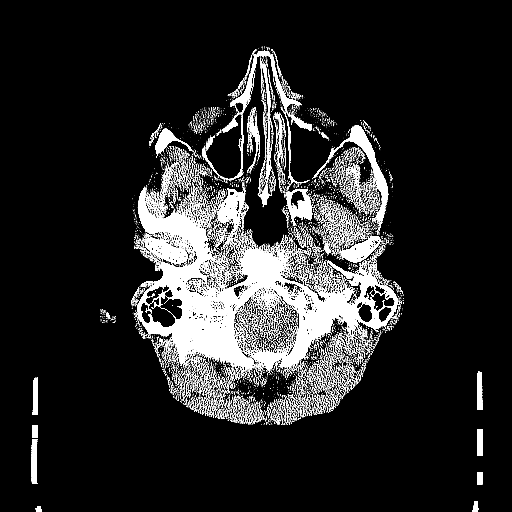
[im 8/74  bone]
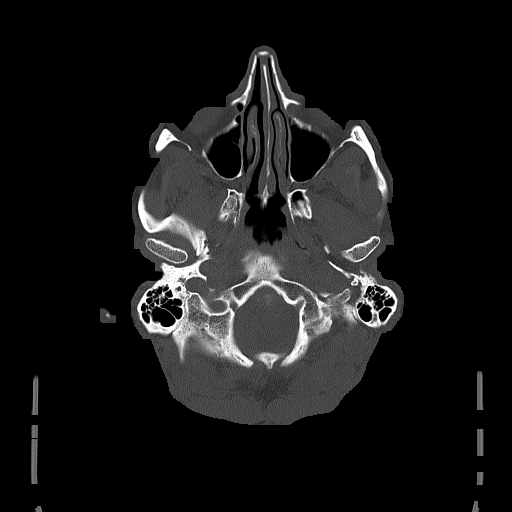
[im 15/74  brain]
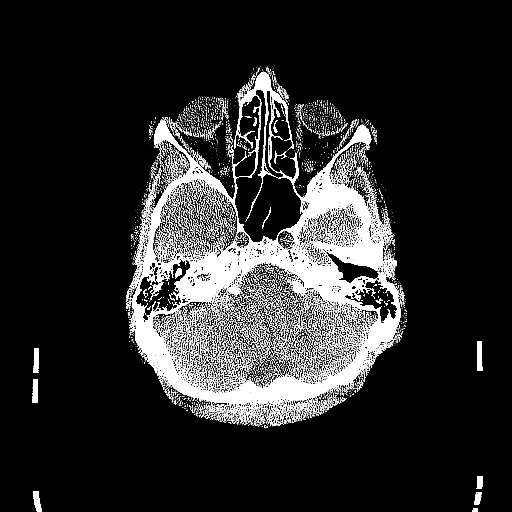
[im 22/74  brain]
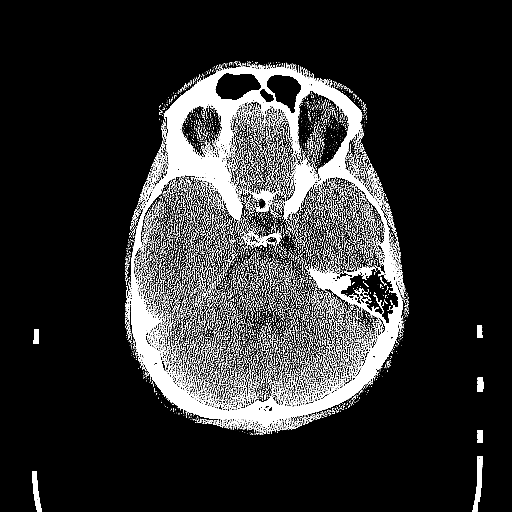
[im 33/74  brain]
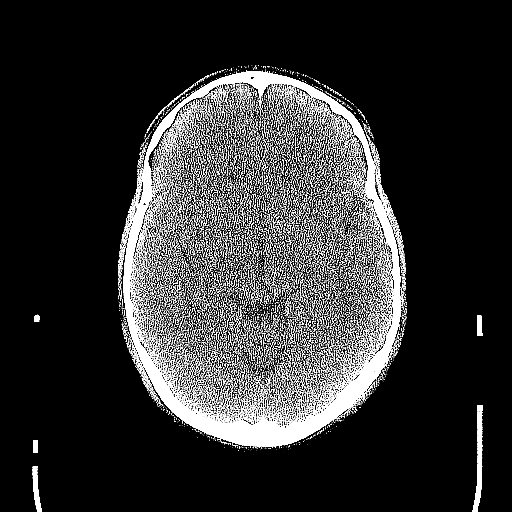
[im 41/74  brain]
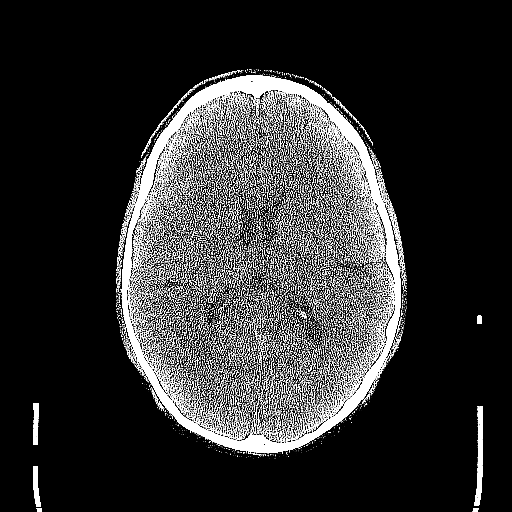
[im 41/74  bone]
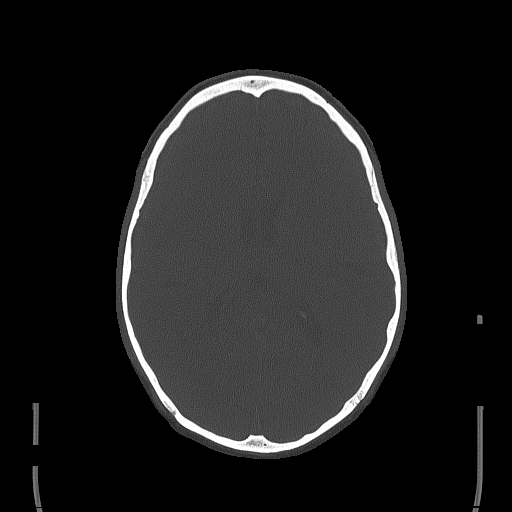
[im 52/74  brain]
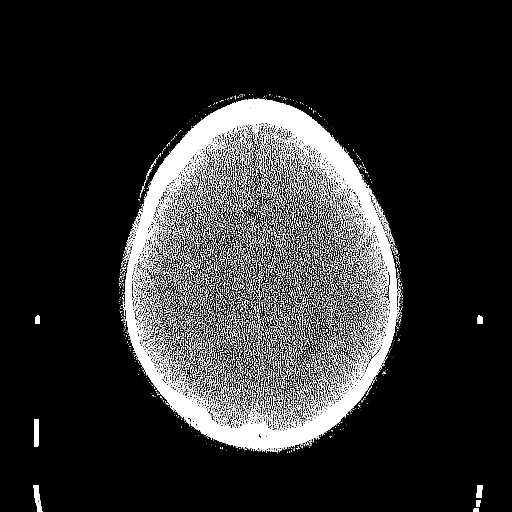
[im 59/74  brain]
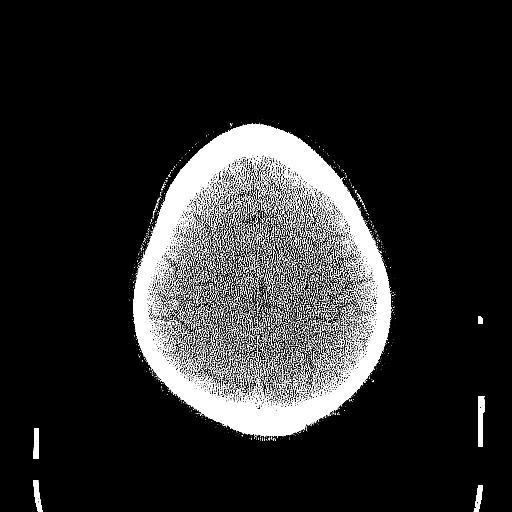
[im 66/74  brain]
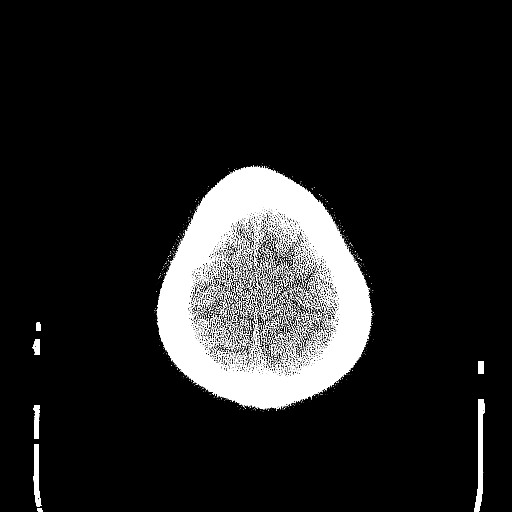

[Series 5: head 3.0 mpr cor · coronal · 0.29mm/px · 3 of 72 slices shown]
[im 24/72  brain]
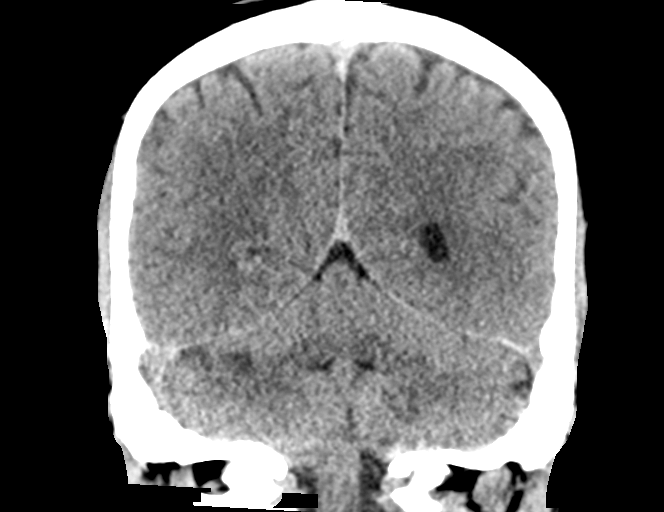
[im 32/72  brain]
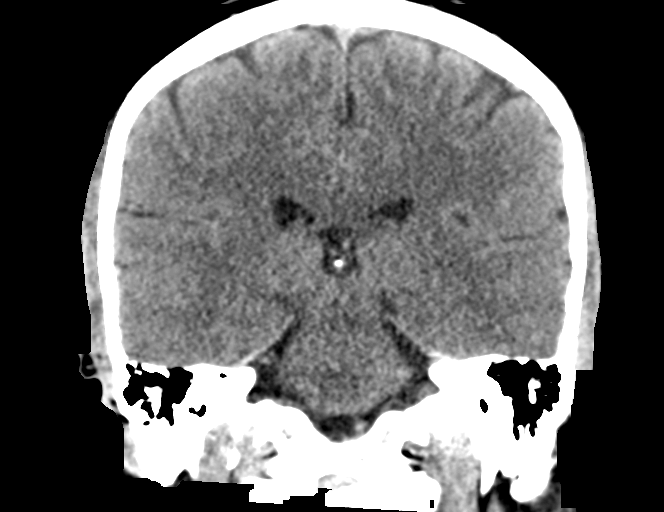
[im 40/72  brain]
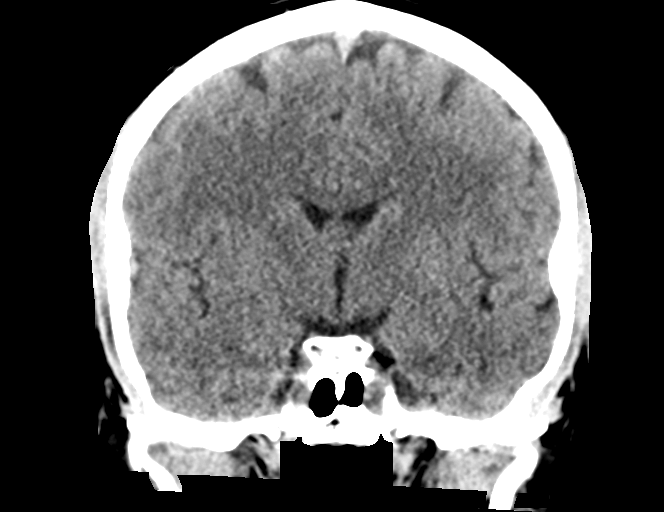

[Series 6: head 3.0 mpr sag · sagittal · 0.29mm/px · 3 of 67 slices shown]
[im 23/67  brain]
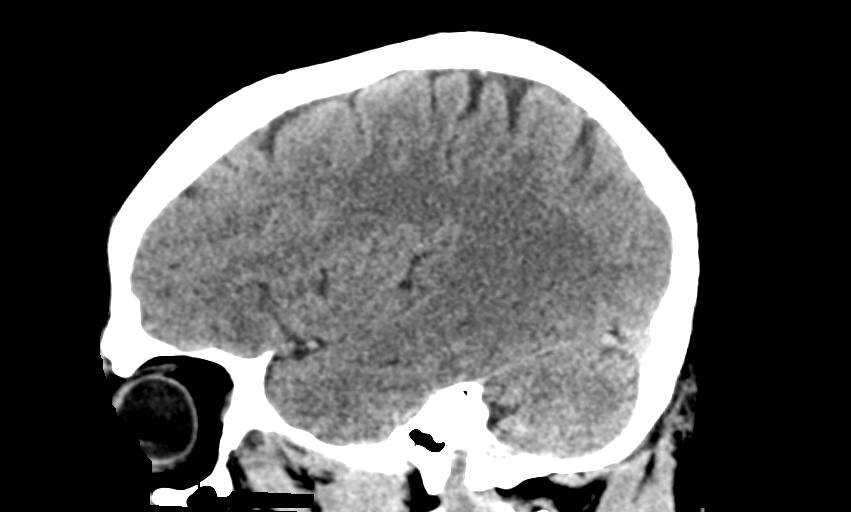
[im 34/67  brain]
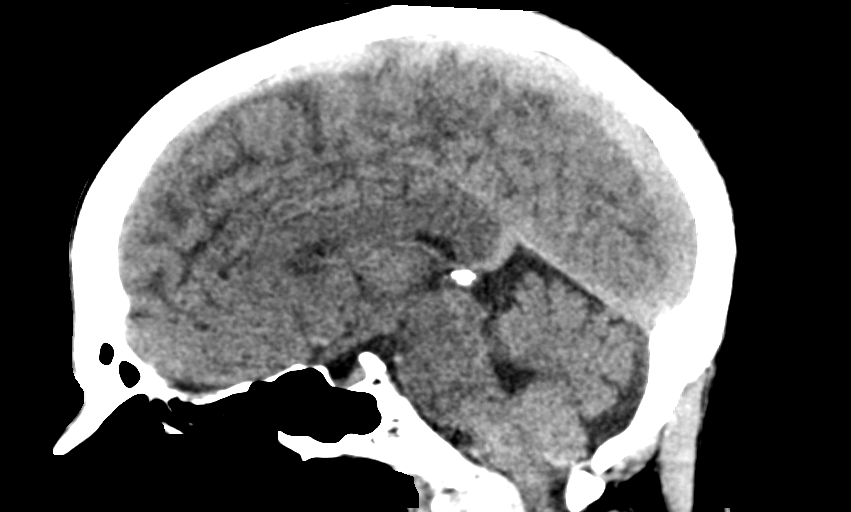
[im 45/67  brain]
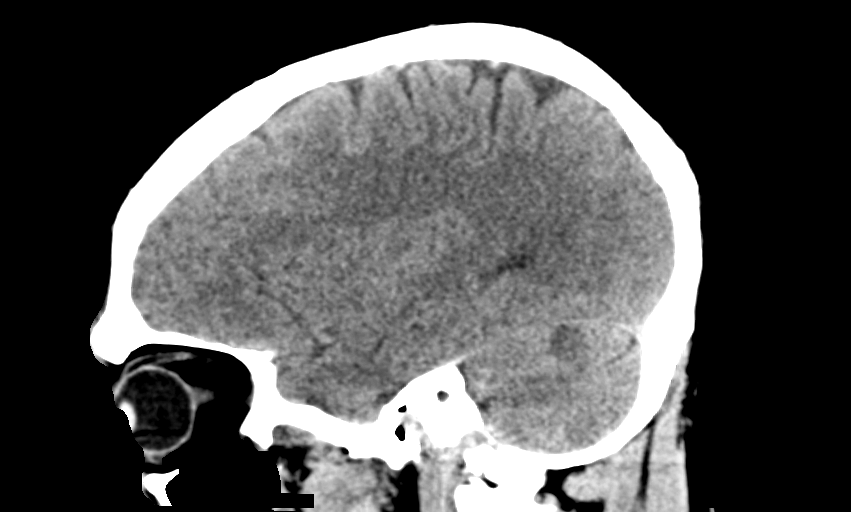

[14 of 47 positions shown; findings below may reference images not displayed]

FINDINGS: Brain: There is no acute intracranial hemorrhage. Evolving recent
cerebellar infarcts are again noted. No significant mass effect. No
new loss of gray-white differentiation. No extra-axial collection.

Vascular: No hyperdense vessel.

Skull: Unremarkable.

Sinuses/Orbits: No acute abnormality.

Other: Mastoid air cells are clear.

ASPECTS ([place] Stroke Program Early CT Score)

- Ganglionic level infarction (caudate, lentiform nuclei, internal
capsule, insula, M1-M3 cortex): 7

- Supraganglionic infarction (M4-M6 cortex): 3

Total score (0-10 with 10 being normal): 10
IMPRESSION: No acute intracranial hemorrhage or new infarction.

Evolving recent bilateral cerebellar infarcts.

These results were communicated to Dr. CRISTHIAN at [DATE] on [DATE]
by text page via the [employer] messaging system.

## 2021-10-30 IMAGING — CT CT ANGIO HEAD-NECK (W OR W/O PERF)
1 of 8 series · 14 of 47 positions shown · non-contrast
Comparison: CT head from the same day.  MRA from [DATE].
COMPARISON: CT head from the same day.  MRA from [DATE].

Addendum:
CLINICAL DATA: Stroke, follow up

EXAM:
CT ANGIOGRAPHY HEAD AND NECK
TECHNIQUE: Multidetector CT imaging of the head and neck was performed using
the standard protocol during bolus administration of intravenous
contrast. Multiplanar CT image reconstructions and MIPs were
obtained to evaluate the vascular anatomy. Carotid stenosis
measurements (when applicable) are obtained utilizing NASCET
criteria, using the distal internal carotid diameter as the
denominator.

[Series 12: thin · axial · 0.43mm/px · z∈[-280,+11]mm · 14 of 671 slices shown]
[im 45/671  brain]
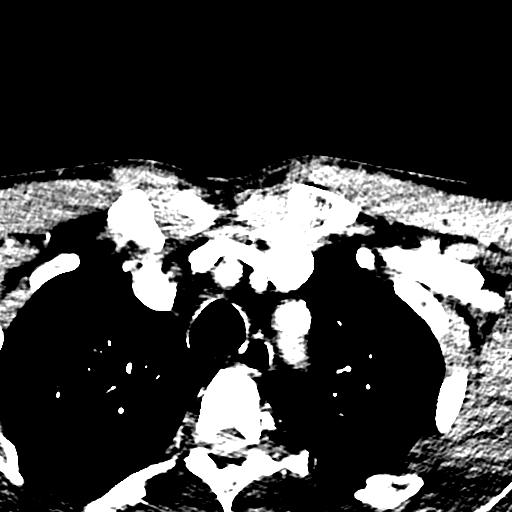
[im 90/671  bone]
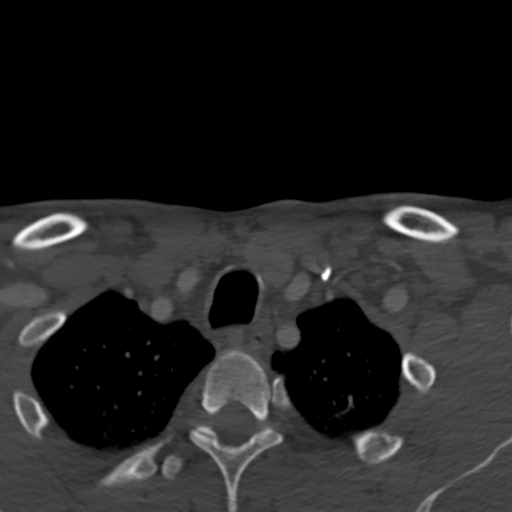
[im 135/671  brain]
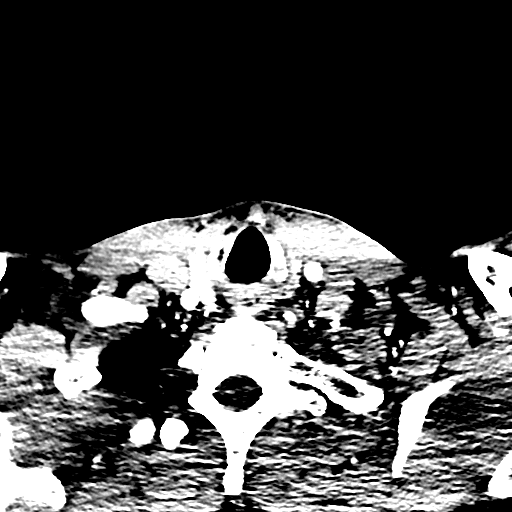
[im 179/671  bone]
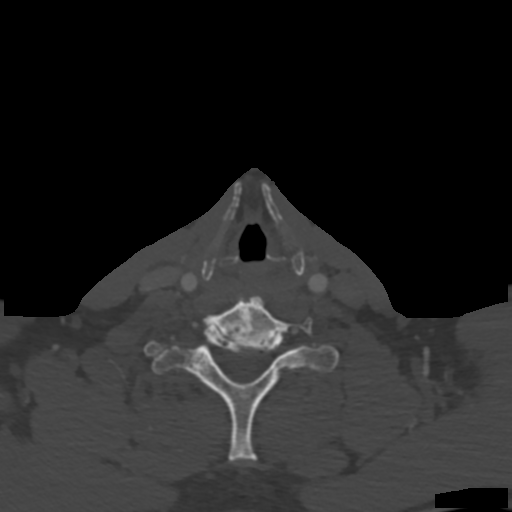
[im 224/671  brain]
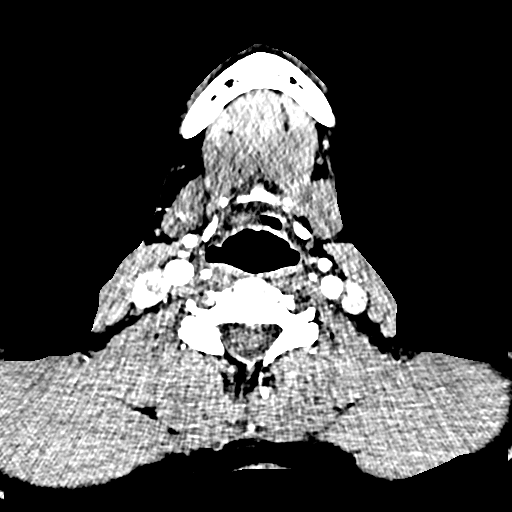
[im 269/671  bone]
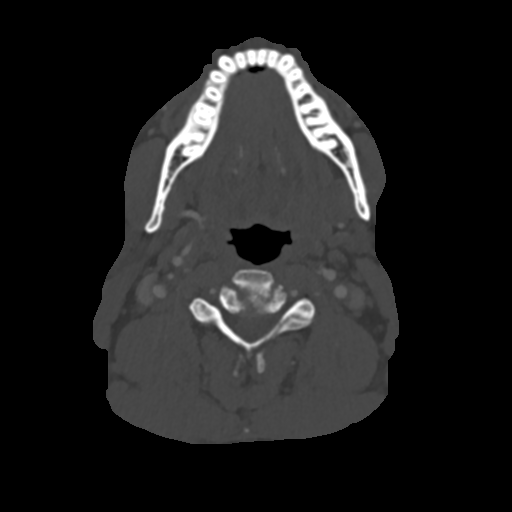
[im 313/671  brain]
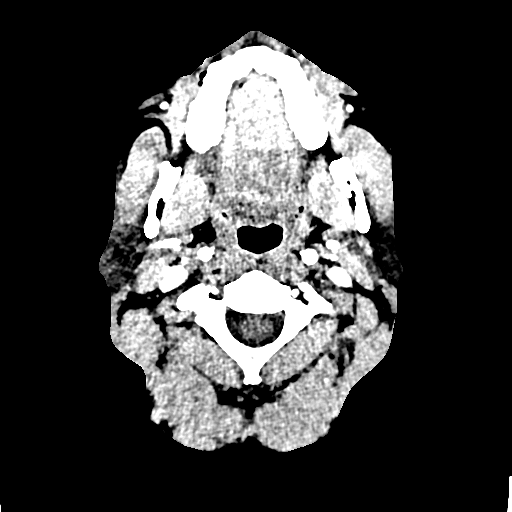
[im 358/671  bone]
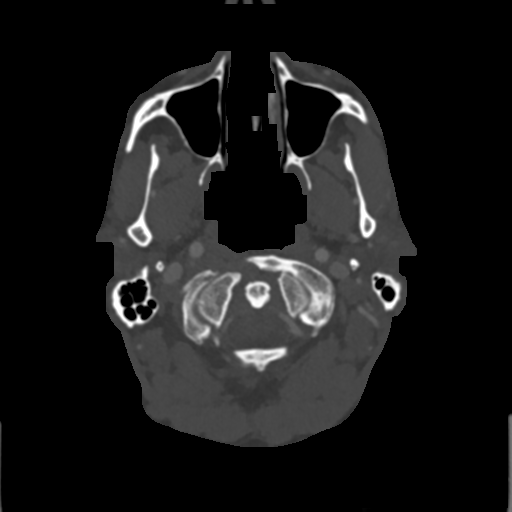
[im 403/671  brain]
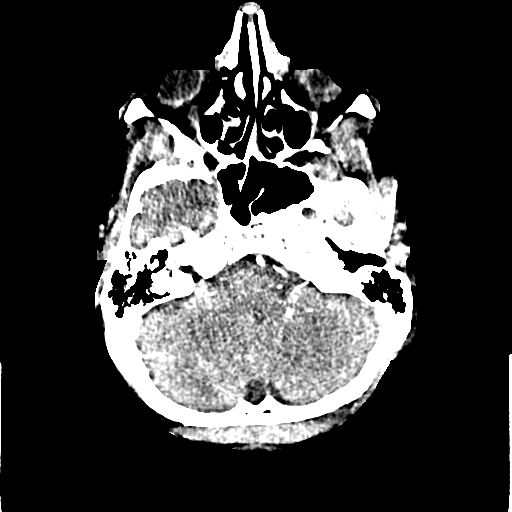
[im 447/671  bone]
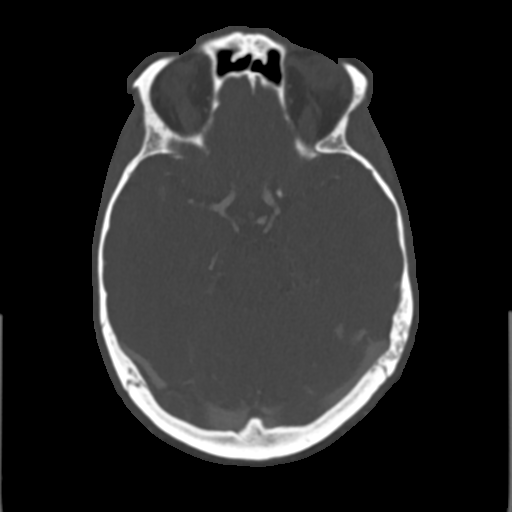
[im 492/671  brain]
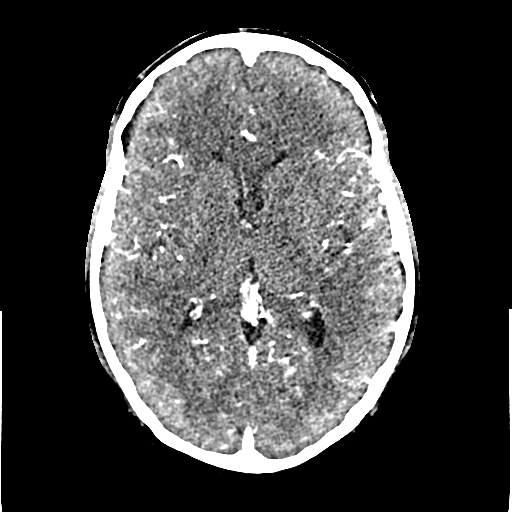
[im 537/671  bone]
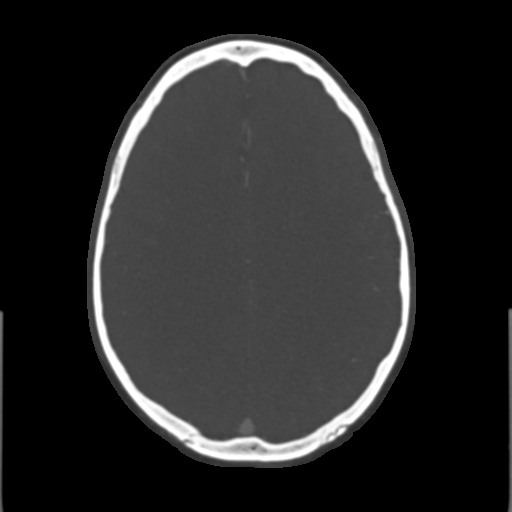
[im 581/671  brain]
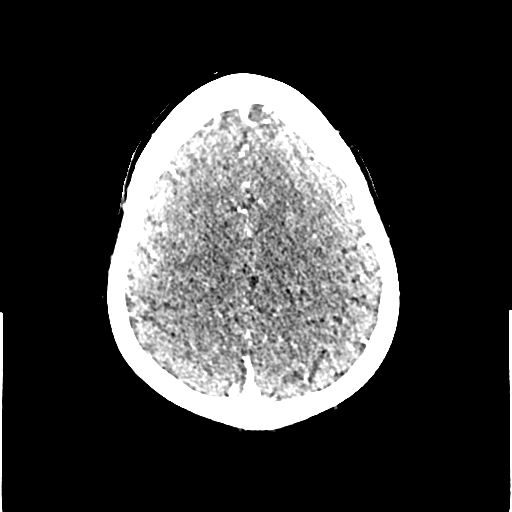
[im 626/671  bone]
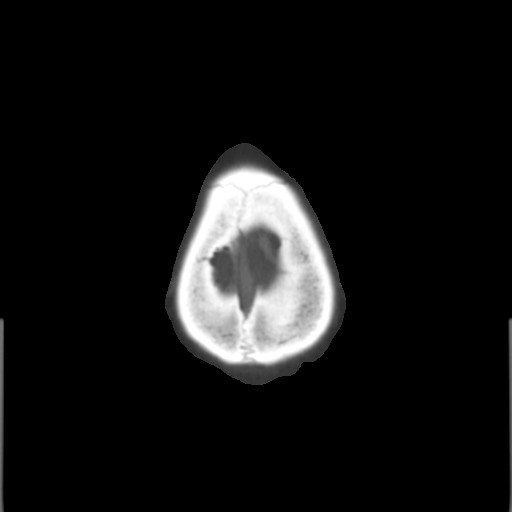

[14 of 47 positions shown; findings below may reference images not displayed]

RADIATION DOSE REDUCTION: This exam was performed according to the
departmental dose-optimization program which includes automated
exposure control, adjustment of the mA and/or kV according to
patient size and/or use of iterative reconstruction technique.

CONTRAST:  75mL OMNIPAQUE IOHEXOL 350 MG/ML SOLN
FINDINGS: CTA NECK FINDINGS

Aortic arch: Great vessel origins are patent without significant
stenosis.

Right carotid system: No evidence of dissection, stenosis (50% or
greater) or occlusion.

Left carotid system: No evidence of dissection, stenosis (50% or
greater) or occlusion.

Vertebral arteries: Left dominant. The right V3 vertebral artery is
severely narrowed and irregular in the region of displaced right C1
lateral mass.

Skeleton: Redemonstrated remote C1 burst fracture with fragmentation
and lateral displacement of the C1 lateral masses and mildly widened
atlantodental interval. w

Other neck: No acute findings.

Upper chest: Visualized lung apices are clear.

Review of the MIP images confirms the above findings

CTA HEAD FINDINGS

Anterior circulation: Bilateral ICAs, MCAs, and ACAs are patent
without proximal hemodynamically significant stenosis. No aneurysm
identified.

Posterior circulation: Previously occluded distal intradural
vertebral arteries and basilar artery are patent, but small.
Suspected severe stenosis versus occlusion of a small left P1 PCA.
Otherwise, Bilateral posterior cerebral arteries are now patent.
Prominent bilateral posterior communicating arteries with small
vertebrobasilar system, anatomic variant.

Venous sinuses: No evidence of dural venous sinus thrombosis.

Anatomic variants: Detailed above.

Review of the MIP images confirms the above findings
IMPRESSION: 1. Previously occluded distal intradural vertebral arteries and
basilar artery are patent, but small.
2. Suspected severe stenosis versus occlusion of a small left P1 PCA
with prominent bilateral posterior communicating arteries and small
vertebrobasilar system.
3. The right V3 vertebral artery is severely narrowed and irregular
in the region of displaced right C1 lateral mass. Traumatic arterial
injury/dissection (possibly chronic given prior occlusion) is
possible.
4. Redemonstrated remote C1 burst fracture with fragmentation and
lateral displacement of the C1 lateral masses and mildly widened
atlantodental interval. Given the malalignment and impression #3,
this fracture warrants close surgical follow-up to if the patient
has not been recently seen/evaluated.

ADDENDUM:
Findings and recommendations discussed with Dr. POLIN via telephone at
[DATE] p.m.

*** End of Addendum ***
RADIATION DOSE REDUCTION: This exam was performed according to the
departmental dose-optimization program which includes automated
exposure control, adjustment of the mA and/or kV according to
patient size and/or use of iterative reconstruction technique.

CONTRAST:  75mL OMNIPAQUE IOHEXOL 350 MG/ML SOLN
FINDINGS: CTA NECK FINDINGS

Aortic arch: Great vessel origins are patent without significant
stenosis.

Right carotid system: No evidence of dissection, stenosis (50% or
greater) or occlusion.

Left carotid system: No evidence of dissection, stenosis (50% or
greater) or occlusion.

Vertebral arteries: Left dominant. The right V3 vertebral artery is
severely narrowed and irregular in the region of displaced right C1
lateral mass.

Skeleton: Redemonstrated remote C1 burst fracture with fragmentation
and lateral displacement of the C1 lateral masses and mildly widened
atlantodental interval. w

Other neck: No acute findings.

Upper chest: Visualized lung apices are clear.

Review of the MIP images confirms the above findings

CTA HEAD FINDINGS

Anterior circulation: Bilateral ICAs, MCAs, and ACAs are patent
without proximal hemodynamically significant stenosis. No aneurysm
identified.

Posterior circulation: Previously occluded distal intradural
vertebral arteries and basilar artery are patent, but small.
Suspected severe stenosis versus occlusion of a small left P1 PCA.
Otherwise, Bilateral posterior cerebral arteries are now patent.
Prominent bilateral posterior communicating arteries with small
vertebrobasilar system, anatomic variant.

Venous sinuses: No evidence of dural venous sinus thrombosis.

Anatomic variants: Detailed above.

Review of the MIP images confirms the above findings
IMPRESSION: 1. Previously occluded distal intradural vertebral arteries and
basilar artery are patent, but small.
2. Suspected severe stenosis versus occlusion of a small left P1 PCA
with prominent bilateral posterior communicating arteries and small
vertebrobasilar system.
3. The right V3 vertebral artery is severely narrowed and irregular
in the region of displaced right C1 lateral mass. Traumatic arterial
injury/dissection (possibly chronic given prior occlusion) is
possible.
4. Redemonstrated remote C1 burst fracture with fragmentation and
lateral displacement of the C1 lateral masses and mildly widened
atlantodental interval. Given the malalignment and impression #3,
this fracture warrants close surgical follow-up to if the patient
has not been recently seen/evaluated.

## 2021-10-30 MED ORDER — ACETAMINOPHEN 160 MG/5ML PO SOLN
650.0000 mg | ORAL | Status: DC | PRN
Start: 2021-10-30 — End: 2021-11-01

## 2021-10-30 MED ORDER — ASPIRIN EC 81 MG PO TBEC
81.0000 mg | DELAYED_RELEASE_TABLET | Freq: Every day | ORAL | Status: DC
  Administered 2021-10-31 – 2021-11-01 (×2): 81 mg via ORAL
  Filled 2021-10-30 (×2): qty 1

## 2021-10-30 MED ORDER — ACETAMINOPHEN 325 MG PO TABS
650.0000 mg | ORAL_TABLET | ORAL | Status: DC | PRN
  Administered 2021-10-30 – 2021-11-01 (×6): 650 mg via ORAL
  Filled 2021-10-30 (×7): qty 2

## 2021-10-30 MED ORDER — ENOXAPARIN SODIUM 40 MG/0.4ML IJ SOSY
40.0000 mg | PREFILLED_SYRINGE | Freq: Every day | INTRAMUSCULAR | Status: DC
  Administered 2021-10-31: 40 mg via SUBCUTANEOUS
  Filled 2021-10-30 (×2): qty 0.4

## 2021-10-30 MED ORDER — SENNOSIDES-DOCUSATE SODIUM 8.6-50 MG PO TABS: 1.0000 | ORAL_TABLET | Freq: Every evening | ORAL | Status: DC | PRN

## 2021-10-30 MED ORDER — ACETAMINOPHEN 650 MG RE SUPP: 650.0000 mg | RECTAL | Status: DC | PRN

## 2021-10-30 MED ORDER — ROSUVASTATIN CALCIUM 20 MG PO TABS
20.0000 mg | ORAL_TABLET | Freq: Every day | ORAL | Status: DC
Start: 2021-10-30 — End: 2021-10-30

## 2021-10-30 MED ORDER — ADULT MULTIVITAMIN W/MINERALS CH
1.0000 | ORAL_TABLET | Freq: Every day | ORAL | Status: DC
  Administered 2021-10-31 – 2021-11-01 (×2): 1 via ORAL
  Filled 2021-10-30 (×3): qty 1

## 2021-10-30 MED ORDER — SODIUM CHLORIDE 0.9 % IV SOLN: INTRAVENOUS | Status: DC

## 2021-10-30 MED ORDER — ROSUVASTATIN CALCIUM 20 MG PO TABS
40.0000 mg | ORAL_TABLET | Freq: Every day | ORAL | Status: DC
  Administered 2021-10-31 – 2021-11-01 (×2): 40 mg via ORAL
  Filled 2021-10-30 (×3): qty 2

## 2021-10-30 MED ORDER — IOHEXOL 350 MG/ML SOLN
75.0000 mL | Freq: Once | INTRAVENOUS | Status: AC | PRN
  Administered 2021-10-30: 75 mL via INTRAVENOUS

## 2021-10-30 MED ORDER — CLOPIDOGREL BISULFATE 75 MG PO TABS
75.0000 mg | ORAL_TABLET | Freq: Every day | ORAL | Status: DC
  Administered 2021-10-31 – 2021-11-01 (×2): 75 mg via ORAL
  Filled 2021-10-30 (×2): qty 1

## 2021-10-30 MED ORDER — STROKE: EARLY STAGES OF RECOVERY BOOK
Freq: Once | Status: AC
  Filled 2021-10-30: qty 1

## 2021-10-30 NOTE — ED Provider Notes (Signed)
?[facility] ?Provider Note ? ? ?CSN: [id number] ?Arrival date & time: 10/30/21  1407 ? ?An emergency department physician performed an initial assessment on this suspected stroke patient at 1400. ? ?History ? ?Chief Complaint  ?Patient presents with  ? Code Stroke  ? ? ?Christopher Pugh is a 51 y.o. male. ? ?Patient with history of recent stroke and IR intervention 2 weeks ago, presents with new onset unsteady gait and double vision.  Symptoms occurred about 1520 minutes before arrival.  Reported last known well without symptoms was 2 PM. ? ?Upon arrival he was a stroke alert activation and taken directly to CT scanner.. ? ? ?  ? ?Home Medications ?Prior to Admission medications   ?Medication Sig Start Date End Date Taking? Authorizing Provider  ?aspirin 81 MG EC tablet Take 1 tablet (81 mg total) by mouth daily. Swallow whole. 10/22/21   Rosalin Hawking, MD  ?Cholecalciferol (D3 PO) Take 1 capsule by mouth daily.    [provider]  ?clopidogrel (PLAVIX) 75 MG tablet Take 1 tablet (75 mg total) by mouth daily. 10/22/21   Rosalin Hawking, MD  ?ibuprofen (ADVIL) 200 MG tablet Take 600 mg by mouth every 6 (six) hours as needed for headache or mild pain.    [provider]  ?Multiple Vitamin (MULTIVITAMIN) tablet Take 1 tablet by mouth daily.    [provider]  ?rosuvastatin (CRESTOR) 20 MG tablet Take 1 tablet (20 mg total) by mouth daily. 10/22/21   Rosalin Hawking, MD  ?tadalafil (CIALIS) 5 MG tablet Take 5 mg by mouth daily. 11/13/19   [provider]  ?   ? ?Allergies    ?Patient has no known allergies.   ? ?Review of Systems   ?Review of Systems  ?Constitutional:  Negative for fever.  ?HENT:  Negative for ear pain and sore throat.   ?Eyes:  Negative for pain.  ?Respiratory:  Negative for cough.   ?Cardiovascular:  Negative for chest pain.  ?Gastrointestinal:  Negative for abdominal pain.  ?Genitourinary:  Negative for flank pain.  ?Musculoskeletal:   Negative for back pain.  ?Skin:  Negative for color change and rash.  ?Neurological:  Negative for syncope.  ?All other systems reviewed and are negative. ? ?Physical Exam ?Updated Vital Signs ?BP (!) 130/93   Pulse 73   Temp 98 ?F (36.7 ?C) (Oral)   Resp (!) 9   SpO2 98%  ?Physical Exam ?Constitutional:   ?   Appearance: He is well-developed.  ?HENT:  ?   Head: Normocephalic.  ?   Nose: Nose normal.  ?Eyes:  ?   Extraocular Movements: Extraocular movements intact.  ?Cardiovascular:  ?   Rate and Rhythm: Normal rate.  ?Pulmonary:  ?   Effort: Pulmonary effort is normal.  ?Skin: ?   Coloration: Skin is not jaundiced.  ?Neurological:  ?   Mental Status: He is alert.  ? ? ?ED Results / Procedures / Treatments   ?Labs ?(all labs ordered are listed, but only abnormal results are displayed) ?Labs Reviewed  ?CBC - Abnormal; Notable for the following components:  ?    Result Value  ? MCHC 36.9 (*)   ? All other components within normal limits  ?I-STAT CHEM 8, ED - Abnormal; Notable for the following components:  ? BUN 27 (*)   ? Creatinine, Ser 1.30 (*)   ? Glucose, Bld 105 (*)   ? All other components within normal limits  ?CBG MONITORING, ED - Abnormal;  Notable for the following components:  ? Glucose-Capillary 104 (*)   ? All other components within normal limits  ?RESP PANEL BY RT-PCR (FLU A&B, COVID) [id number]  ?ETHANOL  ?PROTIME-INR  ?APTT  ?DIFFERENTIAL  ?COMPREHENSIVE METABOLIC PANEL  ?RAPID URINE DRUG SCREEN, HOSP PERFORMED  ?URINALYSIS, ROUTINE W REFLEX MICROSCOPIC  ? ? ?EKG ?EKG Interpretation ? ?Date/Time:  Friday October 30 2021 14:18:31 EDT ?Ventricular Rate:  96 ?PR Interval:  162 ?QRS Duration: 92 ?QT Interval:  350 ?QTC Calculation: 442 ?R Axis:   85 ?Text Interpretation: Normal sinus rhythm Normal ECG When compared with ECG of 19-Oct-2021 07:25, PREVIOUS ECG IS PRESENT Confirmed by Thamas Jaegers ([id number]) on 10/30/2021 2:29:07 PM ? ?Radiology ?CT HEAD CODE STROKE WO CONTRAST ? ?Result Date: 10/30/2021 ?CLINICAL  DATA:  Code stroke.  Neuro deficit, acute, stroke suspected EXAM: CT HEAD WITHOUT CONTRAST TECHNIQUE: Contiguous axial images were obtained from the base of the skull through the vertex without intravenous contrast. RADIATION DOSE REDUCTION: This exam was performed according to the departmental dose-optimization program which includes automated exposure control, adjustment of the mA and/or kV according to patient size and/or use of iterative reconstruction technique. COMPARISON:  Recent CT and MR imaging FINDINGS: Brain: There is no acute intracranial hemorrhage. Evolving recent cerebellar infarcts are again noted. No significant mass effect. No new loss of gray-white differentiation. No extra-axial collection. Vascular: No hyperdense vessel. Skull: Unremarkable. Sinuses/Orbits: No acute abnormality. Other: Mastoid air cells are clear. ASPECTS [facility] Stroke Program Early CT Score) - Ganglionic level infarction (caudate, lentiform nuclei, internal capsule, insula, M1-M3 cortex): 7 - Supraganglionic infarction (M4-M6 cortex): 3 Total score (0-10 with 10 being normal): 10 IMPRESSION: No acute intracranial hemorrhage or new infarction. Evolving recent bilateral cerebellar infarcts. These results were communicated to Dr. Leonie Man at 2:48 pm on 10/30/2021 by text page via the [facility] messaging system. Electronically Signed   By: Macy Mis M.D.   On: 10/30/2021 14:50   ? ?Procedures ?Marland KitchenCritical Care ?Performed by: Luna Fuse, MD ?Authorized by: Luna Fuse, MD  ? ?Critical care provider statement:  ?  Critical care time (minutes):  40 ?  Critical care time was exclusive of:  Separately billable procedures and treating other patients and teaching time ?  Critical care was necessary to treat or prevent imminent or life-threatening deterioration of the following conditions:  CNS failure or compromise  ? ? ?Medications Ordered in ED ?Medications  ?iohexol (OMNIPAQUE) 350 MG/ML injection 75 mL (75 mLs Intravenous  Contrast Given 10/30/21 1452)  ? ? ?ED Course/ Medical Decision Making/ A&P ?  ?                        ?Medical Decision Making ? ?Patient was a stroke alert activation.  Last known well was 2 PM.  However symptoms resolved during his ER stay.  He had complained of double vision and unsteady gait which has not completely resolved. ? ?He was seen by a neurologist.  CT head and CT angios unremarkable. ? ?Neurology team recommends MRI of the brain and admission to the hospitalist team for observation. ? ?Hospitals have been consulted. ? ? ? ? ? ? ? ?Final Clinical Impression(s) / ED Diagnoses ?Final diagnoses:  ?TIA (transient ischemic attack)  ? ? ?Rx / DC Orders ?ED Discharge Orders   ? ? None  ? ?  ? ? ?  ?Luna Fuse, MD ?10/30/21 1506 ? ?

## 2021-10-30 NOTE — ED Notes (Signed)
Ambulatory to restroom

## 2021-10-30 NOTE — ED Notes (Signed)
Code Stroke activated, [place], [place] ?

## 2021-10-30 NOTE — ED Notes (Signed)
RN messaged MD to confirm if pts MRI is for tomorrow. MD stated to make MRI for today. RN called MRI tech who will change order for today  ?

## 2021-10-30 NOTE — ED Provider Triage Note (Addendum)
Emergency Medicine Provider Triage Evaluation Note ? ?Christopher ARCHULETTA , a 51 y.o. male  was evaluated in triage.  Pt complains of leg symptoms.  He says onset was 15 to 30 minutes ago.  He had sudden onset of diplopia and worsening balance issues.  He feels like he has disequilibrium.  He was recently admitted into the ICU for a stroke that occurred last week.  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  He has had some residual balance issues, but feels these are worsened today. ?No numbness, focal weakness, facial droop, speech changes, altered mental status. ? ?Review of Systems  ?Positive: Diplopia, vertigo, balance issues.  ?Negative:  ? ?Physical Exam  ?BP (!) 143/111 (BP Location: Right Arm)   Pulse 97   Temp 98.2 ?F (36.8 ?C) (Oral)   Resp 14   SpO2 98%  ?Gen:   Awake, no distress   ?Resp:  Normal effort  ?MSK:   Moves extremities without difficulty  ?Other:   ?Alert and Oriented x 3 ?Speech clear with no aphasia ?Cranial Nerve testing ?-Unable to participate in visual field exam due to severe diplopia ?- PERRLA. EOM intact.  Horizontal nystagmus noted on exam ?- Facial Sensation grossly intact ?- No facial asymmetry ?- Uvula and Tongue Midline ?- Accessory Muscles intact ?Motor: ?- 5/5 motor strength in all four extremities.  ?- No pronator Drift ?Sensation: ?- Grossly intact in all four extremities.  ?Coordination:  ?-Oxygen noted in upper extremities ?- Gait without abnormality. ? ? ?Medical Decision Making  ?Medically screening exam initiated at 2:18 PM.  Appropriate orders placed.  DANFORD TAT was informed that the remainder of the evaluation will be completed by another provider, this initial triage assessment does not replace that evaluation, and the importance of remaining in the ED until their evaluation is complete. ? ?Due to recurrence of stroke symptoms and concern for posterior circulating stroke, will call code stroke from triage. ? ? ?  ?Adolphus Birchwood,  PA-C ?10/30/21 1421 ? ?  ?Adolphus Birchwood, PA-C ?10/30/21 1425 ? ?

## 2021-10-30 NOTE — ED Triage Notes (Signed)
Pt recently discharged after a stroke and IR intervention. Pt started having unsteady gait and double vision 15 mins prior to arrival. LKW was 1400.  ?

## 2021-10-30 NOTE — Code Documentation (Signed)
Stroke Response Nurse Documentation ?Code Documentation ? ?JULIE PAOLINI is a 51 y.o. male arriving to [facility]  via [employer] on 10/30/2021 with past medical hx of stroke. On aspirin 325 mg daily and clopidogrel 75 mg daily. Code stroke was activated by ED.  ? ?Patient from home where he was LKW at 1400 and now complaining of double vision and tinnitus that has resolved. Pt was getting out of the shower and stated he felt warm all over that resolved and then he had tinnitus with double vision. Wife drove him to the emergency department. While in CT, all symptoms resolved and patient was left at baseline residual weakness.  ? ?Stroke team at the bedside on patient arrival. Labs drawn Patient to CT with team. NIHSS 0, see documentation for details and code stroke times. The following imaging was completed:  CT Head and CTA. Patient is not a candidate for IV Thrombolytic due to symptoms resolved. Patient is not not a candidate for IR due to No LVO.  ? ?Patient was walked in the room with no gait ataxia reports dizziness that has been present since discharge of last week.  ? ?Care Plan: q2 NIHSS/VS, TIA Alert. Reactivate a Code Stroke if symptoms return.  ? ?Bedside handoff with ED RN Raquel Sarna.   ? ?Kathrin Greathouse  ?Stroke Response RN ? ? ?
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
## 2021-10-30 NOTE — Consult Note (Signed)
Reason for Consult: Code stroke ?Referring Physician: Thamas Jaegers ? ?HPI: Christopher Pugh is an 51 y.o. male.  Who presented with sudden onset of ringing in both ears as well as diplopia and dizziness this afternoon at 2 PM.  Symptoms lasted about 20 to 30 minutes and resolved by the time he reached the hospital.  He states he is now back to his baseline.  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  He was on dual antiplatelet therapy aspirin and Plavix and had anticoagulant panel labs which were negative.  He is supposed to have a 30-day external heart monitor which has not happened yet.  His MRI had shown extensive multifocal bilateral cerebellar infarcts right greater than left and 2D echo showed ejection fraction of 55 to 60%.  LDL cholesterol 132 mg percent and hemoglobin A1c is 5.3.  He was started on aspirin and Plavix and Crestor and states has been compliant with his medications. ?He had emergent CT scan of the head done which shows recent bilateral cerebellar infarcts but no acute abnormality.  Emergent CT angiogram of the brain and neck was also performed which shows no large vessel stenosis or occlusion. ?Past Medical History:  ?Diagnosis Date  ? BPH without obstruction/lower urinary tract symptoms   ? CVA (cerebral vascular accident) ([place]) 10/19/2021  ? Hypogonadism in male   ? ? ?Past Surgical History:  ?Procedure Laterality Date  ? IR CT HEAD LTD  10/19/2021  ? IR PERCUTANEOUS ART THROMBECTOMY/INFUSION INTRACRANIAL INC DIAG ANGIO  10/19/2021  ? IR US GUIDE VASC ACCESS RIGHT  10/19/2021  ? RADIOLOGY WITH ANESTHESIA N/A 10/19/2021  ? Procedure: IR WITH ANESTHESIA;  Surgeon: Radiologist, Medication, MD;  Location: [place];  Service: Radiology;   Laterality: N/A;  ? ? ?Family History  ?Problem Relation Age of Onset  ? Hypertension Father   ? Stroke Paternal Aunt   ? ? ?Social History:  reports that he has never smoked. He has quit using smokeless tobacco. He reports current alcohol use. He reports that he does not use drugs. ? ?Allergies: No Known Allergies ? ?Medications: I have reviewed the patient's current medications. ? ?Results for orders placed or performed during the hospital encounter of 10/30/21 (from the past 48 hour(s))  ?Protime-INR     Status: None  ? Collection Time: 10/30/21  2:18 PM  ?Result Value Ref Range  ? Prothrombin Time 12.7 11.4 - 15.2 seconds  ? INR 1.0 0.8 - 1.2  ?  Comment: (NOTE) ?INR goal varies based on device and disease states. ?Performed at [facility], [place] [address], [place], [place] ?[address] ?  ?APTT     Status: None  ? Collection Time: 10/30/21  2:18 PM  ?Result Value Ref Range  ? aPTT 28 24 - 36 seconds  ?  Comment: Performed at [facility], [place] [address] [place], [place] [address]  ?CBC     Status: Abnormal  ? Collection Time: 10/30/21  2:18 PM  ?Result Value Ref Range  ? WBC 6.7 4.0 - 10.5 K/uL  ? RBC 4.60 4.22 - 5.81 MIL/uL  ? Hemoglobin 15.2 13.0 - 17.0 g/dL  ? HCT 41.2 39.0 - 52.0 %  ? MCV 89.6 80.0 - 100.0 fL  ? MCH 33.0 26.0 - 34.0 pg  ?  MCHC 36.9 (H) 30.0 - 36.0 g/dL  ? RDW 11.9 11.5 - 15.5 %  ? Platelets 241 150 - 400 K/uL  ? nRBC 0.0 0.0 - 0.2 %  ?  Comment: Performed at [facility], [place] [address], [place], [place] [address]  ?Differential     Status: None  ? Collection Time: 10/30/21  2:18 PM  ?Result Value Ref Range  ? Neutrophils Relative % 73 %  ? Neutro Abs 4.9 1.7 - 7.7 K/uL  ? Lymphocytes Relative 18 %  ? Lymphs Abs 1.2 0.7 - 4.0 K/uL  ? Monocytes Relative 7 %  ? Monocytes Absolute 0.5 0.1 - 1.0 K/uL  ? Eosinophils Relative 1 %  ? Eosinophils Absolute 0.1 0.0 - 0.5 K/uL  ? Basophils Relative 1 %  ? Basophils Absolute 0.0 0.0 - 0.1 K/uL  ? Immature Granulocytes 0 %  ? Abs  Immature Granulocytes 0.03 0.00 - 0.07 K/uL  ?  Comment: Performed at [facility], [place] [address], [place], [place] [address]  ?Comprehensive metabolic panel     Status: Abnormal  ? Collection Time: 10/30/21  2:18 PM  ?Result Value Ref Range  ? Sodium 136 135 - 145 mmol/L  ? Potassium 3.9 3.5 - 5.1 mmol/L  ? Chloride 105 98 - 111 mmol/L  ? CO2 23 22 - 32 mmol/L  ? Glucose, Bld 105 (H) 70 - 99 mg/dL  ?  Comment: Glucose reference range applies only to samples taken after fasting for at least 8 hours.  ? BUN 25 (H) 6 - 20 mg/dL  ? Creatinine, Ser 1.25 (H) 0.61 - 1.24 mg/dL  ? Calcium 9.9 8.9 - 10.3 mg/dL  ? Total Protein 7.2 6.5 - 8.1 g/dL  ? Albumin 4.6 3.5 - 5.0 g/dL  ? AST 33 15 - 41 U/L  ? ALT 36 0 - 44 U/L  ? Alkaline Phosphatase 43 38 - 126 U/L  ? Total Bilirubin 0.9 0.3 - 1.2 mg/dL  ? GFR, Estimated >60 >60 mL/min  ?  Comment: (NOTE) ?Calculated using the CKD-EPI Creatinine Equation (2021) ?  ? Anion gap 8 5 - 15  ?  Comment: Performed at [facility], [place] [address] [place], [place] [address]  ?Ethanol     Status: None  ? Collection Time: 10/30/21  2:21 PM  ?Result Value Ref Range  ? Alcohol, Ethyl (B) <10 <10 mg/dL  ?  Comment: (NOTE) ?Lowest detectable limit for serum alcohol is 10 mg/dL. ? ?For medical purposes only. ?Performed at [facility], [place] [address] [place], [place] ?[address] ?  ?CBG monitoring, ED     Status: Abnormal  ? Collection Time: 10/30/21  2:25 PM  ?Result Value Ref Range  ? Glucose-Capillary 104 (H) 70 - 99 mg/dL  ?  Comment: Glucose reference range applies only to samples taken after fasting for at least 8 hours.  ?I-stat chem 8, ED     Status: Abnormal  ? Collection Time: 10/30/21  2:34 PM  ?Result Value Ref Range  ? Sodium 138 135 - 145 mmol/L  ? Potassium 4.0 3.5 - 5.1 mmol/L  ? Chloride 104 98 - 111 mmol/L  ? BUN 27 (H) 6 - 20 mg/dL  ? Creatinine, Ser 1.30 (H) 0.61 - 1.24 mg/dL  ? Glucose, Bld 105 (H) 70 - 99 mg/dL  ?  Comment: Glucose reference range applies  only to samples taken after fasting for at least 8 hours.  ? Calcium, Ion  1.16 1.15 - 1.40 mmol/L  ? TCO2 23 22 - 32 mmol/L  ? Hemoglobin 14.6 13.0 - 17.0 g/dL  ? HCT 43.0 39.0 - 52.0 %  ?CBC     Status: Abnormal  ? Collection Time: 10/30/21  3:50 PM  ?Result Value Ref Range  ? WBC 6.2 4.0 - 10.5 K/uL  ? RBC 4.21 (L) 4.22 - 5.81 MIL/uL  ? Hemoglobin 13.8 13.0 - 17.0 g/dL  ? HCT 38.2 (L) 39.0 - 52.0 %  ? MCV 90.7 80.0 - 100.0 fL  ? MCH 32.8 26.0 - 34.0 pg  ? MCHC 36.1 (H) 30.0 - 36.0 g/dL  ? RDW 11.8 11.5 - 15.5 %  ? Platelets 218 150 - 400 K/uL  ? nRBC 0.0 0.0 - 0.2 %  ?  Comment: Performed at [facility], [place] [address] [place], [place] [address]  ? ? ?CT HEAD CODE STROKE WO CONTRAST ? ?Result Date: 10/30/2021 ?CLINICAL DATA:  Code stroke.  Neuro deficit, acute, stroke suspected EXAM: CT HEAD WITHOUT CONTRAST TECHNIQUE: Contiguous axial images were obtained from the base of the skull through the vertex without intravenous contrast. RADIATION DOSE REDUCTION: This exam was performed according to the departmental dose-optimization program which includes automated exposure control, adjustment of the mA and/or kV according to patient size and/or use of iterative reconstruction technique. COMPARISON:  Recent CT and MR imaging FINDINGS: Brain: There is no acute intracranial hemorrhage. Evolving recent cerebellar infarcts are again noted. No significant mass effect. No new loss of gray-white differentiation. No extra-axial collection. Vascular: No hyperdense vessel. Skull: Unremarkable. Sinuses/Orbits: No acute abnormality. Other: Mastoid air cells are clear. ASPECTS [facility] Stroke Program Early CT Score) - Ganglionic level infarction (caudate, lentiform nuclei, internal capsule, insula, M1-M3 cortex): 7 - Supraganglionic infarction (M4-M6 cortex): 3 Total score (0-10 with 10 being normal): 10 IMPRESSION: No acute intracranial hemorrhage or new infarction. Evolving recent bilateral cerebellar infarcts. These  results were communicated to Dr. Leonie Man at 2:48 pm on 10/30/2021 by text page via the [facility] messaging system. Electronically Signed   By: Macy Mis M.D.   On: 10/30/2021 14:50  ? ?CT ANGIO HEAD NECK W WO CM (C

## 2021-10-30 NOTE — Procedures (Signed)
Patient Name: Christopher Pugh  ?MRN: [id number]  ?Epilepsy Attending: Lora Havens  ?Referring Physician/Provider: Jonetta Osgood, MD ?Date: 10/30/2021 ?Duration: 22.04 mins ?  ?Patient history: 51 y.o. male.  Who presented with sudden onset of ringing in both ears as well as diplopia and dizziness this afternoon at 2 PM. EEG to evaluate for seizure ?  ?Level of alertness: Awake, drowsy ?  ?AEDs: None ?  ?Technical aspects: This EEG study was done with scalp electrodes positioned according to the 10-20 International system of electrode placement. Electrical activity was acquired at a sampling rate of '500Hz'$  and reviewed with a high frequency filter of '70Hz'$  and a low frequency filter of '1Hz'$ . EEG data were recorded continuously and digitally stored.  ?  ?Description: The posterior dominant rhythm consists of 9 Hz activity of moderate voltage (25-35 uV) seen predominantly in posterior head regions, symmetric and reactive to eye opening and eye closing. Drowsiness was characterized by attenuation of the posterior background rhythm.  Hyperventilation and photic stimulation were not performed.    ?  ?IMPRESSION: ?This study is within normal limits. No seizures or epileptiform discharges were seen throughout the recording. ?  ?Lora Havens  ?  ?

## 2021-10-30 NOTE — Progress Notes (Signed)
EEG complete - results pending 

## 2021-10-30 NOTE — ED Notes (Signed)
At this time this RN has called 5w to give report x 2 with no answer. No purple main at this time. Agricultural consultant notified. Bed is ready/clean awaiting RN to be assigned  ?

## 2021-10-31 ENCOUNTER — Observation Stay (HOSPITAL_COMMUNITY): Payer: BC Managed Care – PPO

## 2021-10-31 ENCOUNTER — Encounter (HOSPITAL_COMMUNITY): Payer: Self-pay | Admitting: Internal Medicine

## 2021-10-31 DIAGNOSIS — I63541 Cerebral infarction due to unspecified occlusion or stenosis of right cerebellar artery: Principal | ICD-10-CM

## 2021-10-31 DIAGNOSIS — Z7982 Long term (current) use of aspirin: Secondary | ICD-10-CM | POA: Diagnosis not present

## 2021-10-31 DIAGNOSIS — Z79899 Other long term (current) drug therapy: Secondary | ICD-10-CM | POA: Diagnosis not present

## 2021-10-31 DIAGNOSIS — Z8673 Personal history of transient ischemic attack (TIA), and cerebral infarction without residual deficits: Secondary | ICD-10-CM

## 2021-10-31 DIAGNOSIS — I639 Cerebral infarction, unspecified: Secondary | ICD-10-CM | POA: Diagnosis not present

## 2021-10-31 DIAGNOSIS — G44309 Post-traumatic headache, unspecified, not intractable: Secondary | ICD-10-CM | POA: Diagnosis not present

## 2021-10-31 DIAGNOSIS — S12001S Unspecified nondisplaced fracture of first cervical vertebra, sequela: Secondary | ICD-10-CM | POA: Diagnosis not present

## 2021-10-31 DIAGNOSIS — Z823 Family history of stroke: Secondary | ICD-10-CM | POA: Diagnosis not present

## 2021-10-31 DIAGNOSIS — E785 Hyperlipidemia, unspecified: Secondary | ICD-10-CM | POA: Diagnosis present

## 2021-10-31 DIAGNOSIS — Z8249 Family history of ischemic heart disease and other diseases of the circulatory system: Secondary | ICD-10-CM | POA: Diagnosis not present

## 2021-10-31 DIAGNOSIS — R29700 NIHSS score 0: Secondary | ICD-10-CM | POA: Diagnosis present

## 2021-10-31 DIAGNOSIS — Z20822 Contact with and (suspected) exposure to covid-19: Secondary | ICD-10-CM | POA: Diagnosis present

## 2021-10-31 DIAGNOSIS — R2681 Unsteadiness on feet: Secondary | ICD-10-CM | POA: Diagnosis present

## 2021-10-31 DIAGNOSIS — I63211 Cerebral infarction due to unspecified occlusion or stenosis of right vertebral arteries: Secondary | ICD-10-CM | POA: Diagnosis not present

## 2021-10-31 DIAGNOSIS — H9190 Unspecified hearing loss, unspecified ear: Secondary | ICD-10-CM | POA: Diagnosis present

## 2021-10-31 DIAGNOSIS — E291 Testicular hypofunction: Secondary | ICD-10-CM | POA: Diagnosis present

## 2021-10-31 DIAGNOSIS — Z7902 Long term (current) use of antithrombotics/antiplatelets: Secondary | ICD-10-CM | POA: Diagnosis not present

## 2021-10-31 DIAGNOSIS — R519 Headache, unspecified: Secondary | ICD-10-CM | POA: Diagnosis present

## 2021-10-31 DIAGNOSIS — Z6828 Body mass index (BMI) 28.0-28.9, adult: Secondary | ICD-10-CM | POA: Diagnosis not present

## 2021-10-31 DIAGNOSIS — N400 Enlarged prostate without lower urinary tract symptoms: Secondary | ICD-10-CM | POA: Diagnosis present

## 2021-10-31 DIAGNOSIS — S12040S Displaced lateral mass fracture of first cervical vertebra, sequela: Secondary | ICD-10-CM | POA: Diagnosis not present

## 2021-10-31 DIAGNOSIS — I6501 Occlusion and stenosis of right vertebral artery: Secondary | ICD-10-CM | POA: Diagnosis present

## 2021-10-31 DIAGNOSIS — S1201XA Stable burst fracture of first cervical vertebra, initial encounter for closed fracture: Secondary | ICD-10-CM | POA: Diagnosis present

## 2021-10-31 DIAGNOSIS — H9313 Tinnitus, bilateral: Secondary | ICD-10-CM | POA: Diagnosis present

## 2021-10-31 DIAGNOSIS — H532 Diplopia: Secondary | ICD-10-CM | POA: Diagnosis present

## 2021-10-31 DIAGNOSIS — Z87891 Personal history of nicotine dependence: Secondary | ICD-10-CM | POA: Diagnosis not present

## 2021-10-31 DIAGNOSIS — R29702 NIHSS score 2: Secondary | ICD-10-CM | POA: Diagnosis not present

## 2021-10-31 DIAGNOSIS — E669 Obesity, unspecified: Secondary | ICD-10-CM | POA: Diagnosis present

## 2021-10-31 DIAGNOSIS — G459 Transient cerebral ischemic attack, unspecified: Secondary | ICD-10-CM | POA: Diagnosis present

## 2021-10-31 IMAGING — MR MR HEAD W/O CM
6 of 10 series · 27 of 48 positions shown · non-contrast
Comparison: CT head and CTA head and neck yesterday. Brain MRI and
MRA [DATE] and earlier.

CLINICAL DATA: 51-year-old male neurologic deficit. Bilateral
distal vertebral artery and proximal basilar thrombosis with
bilateral cerebellar infarcts earlier this month. Code stroke
presentation yesterday.

EXAM:
MRI HEAD WITHOUT CONTRAST
TECHNIQUE: Multiplanar, multiecho pulse sequences of the brain and surrounding
structures were obtained without intravenous contrast.

[Series 2: DWI · axial · 3.0mm · 0.94mm/px · z∈[-54,+86]mm · 8 of 95 slices shown (1 of 2)]
[im 1/95]
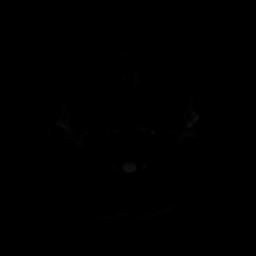
[im 11/95]
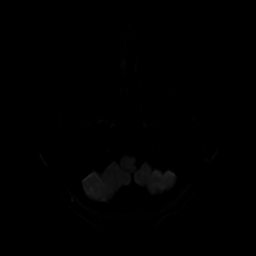
[im 32/95]
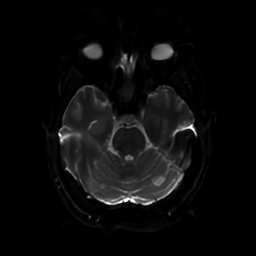
[im 42/95]
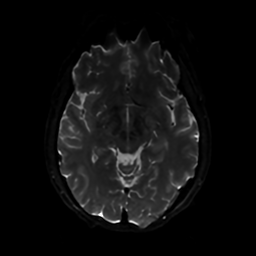
[im 53/95]
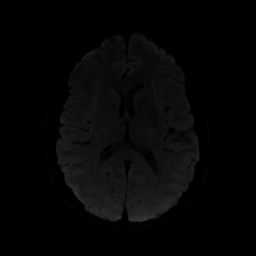
[im 63/95]
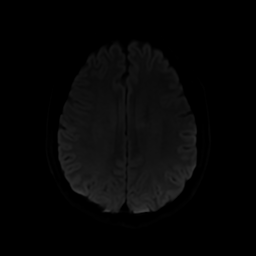
[im 84/95]
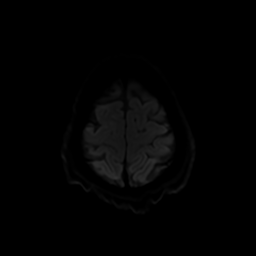
[im 95/95]
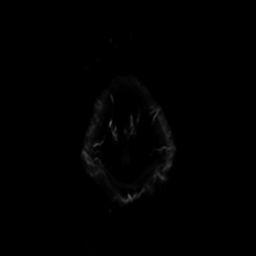

[Series 3: DWI · coronal · 4.0mm · 0.94mm/px · 7 of 74 slices shown (2 of 2)]
[im 1/74]
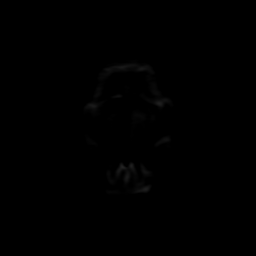
[im 13/74]
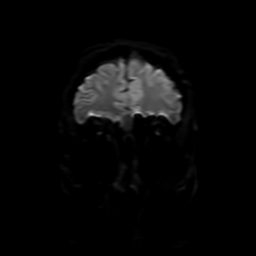
[im 25/74]
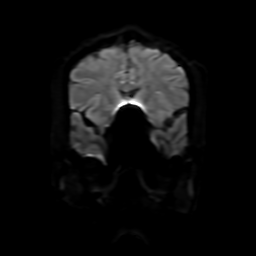
[im 37/74]
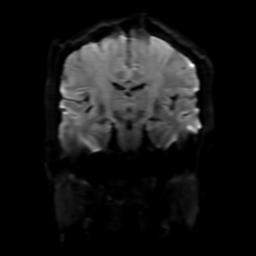
[im 49/74]
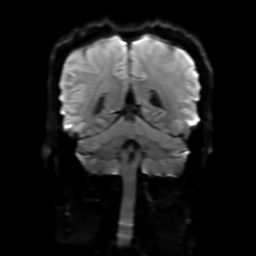
[im 61/74]
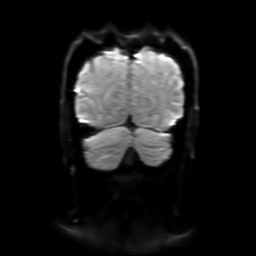
[im 74/74]
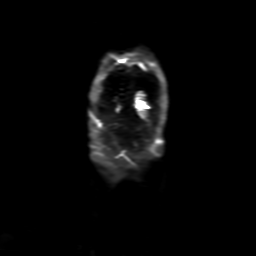

[Series 4: FLAIR · sagittal · 5.0mm · 0.23mm/px · 2 of 24 slices shown (1 of 2)]
[im 1/24]
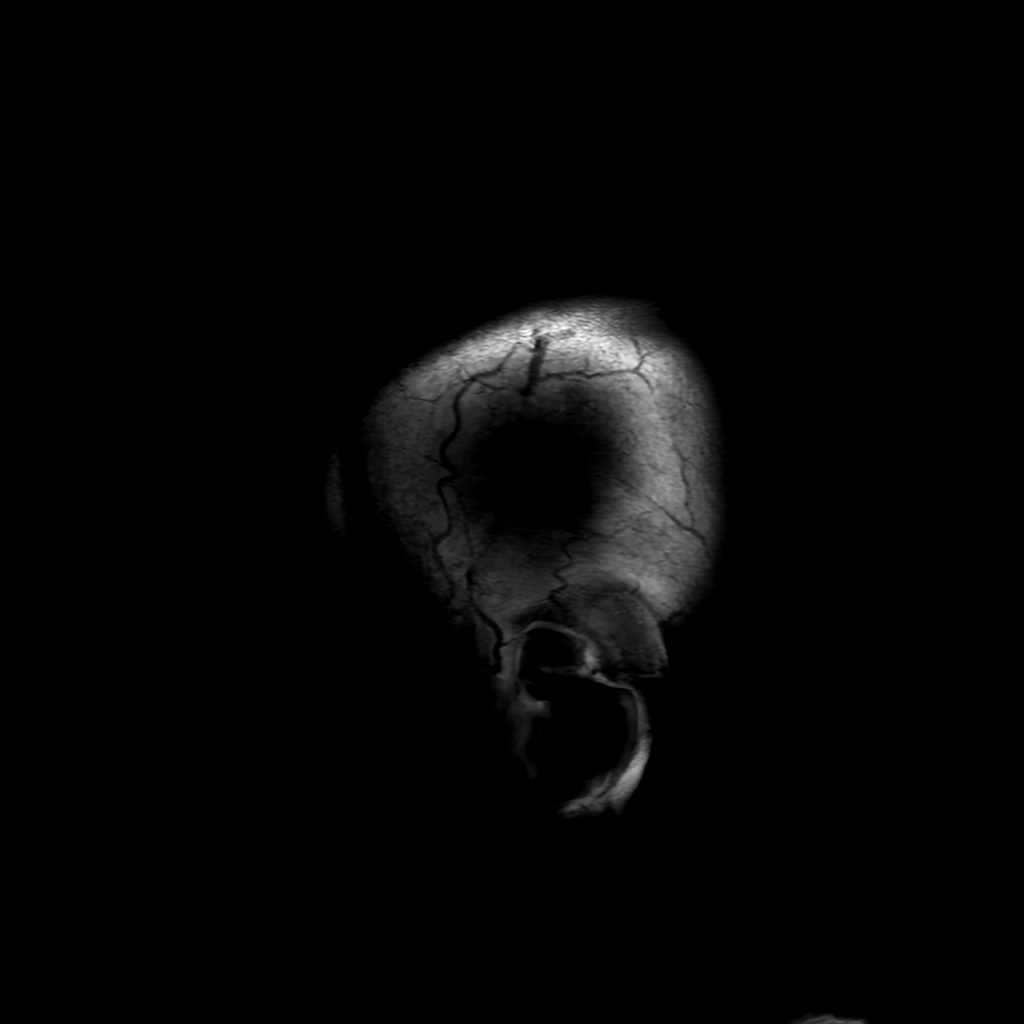
[im 24/24]
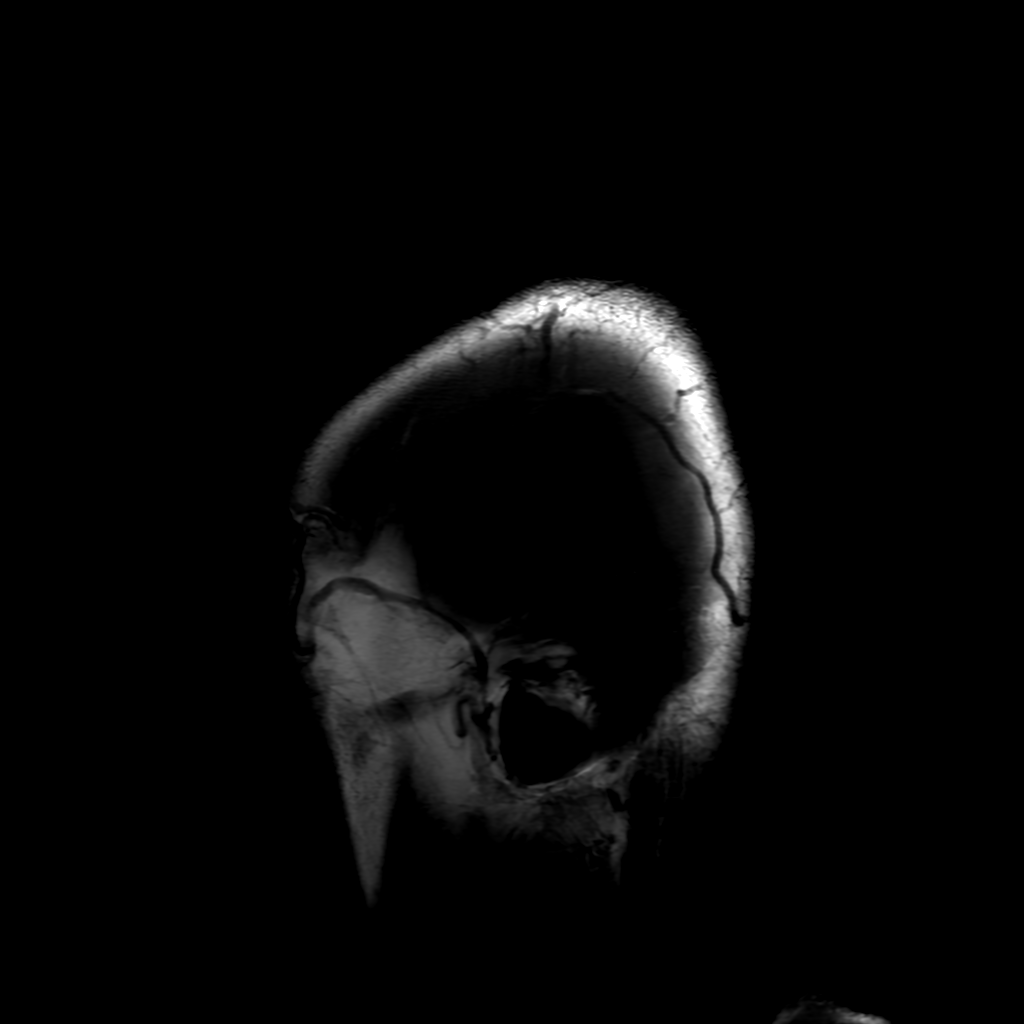

[Series 6: FLAIR · axial · 4.0mm · 0.45mm/px · z∈[-61,+87]mm · 3 of 35 slices shown (2 of 2)]
[im 1/35]
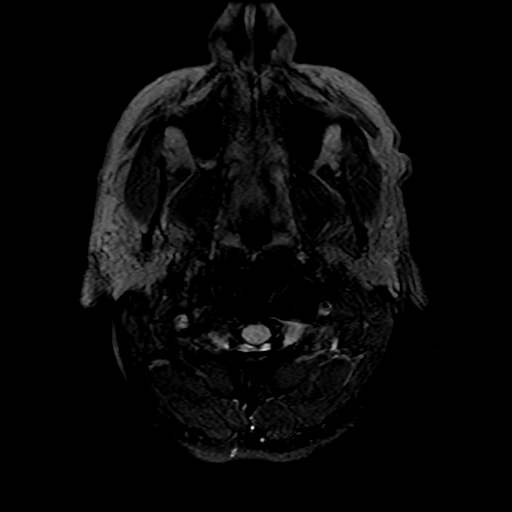
[im 18/35]
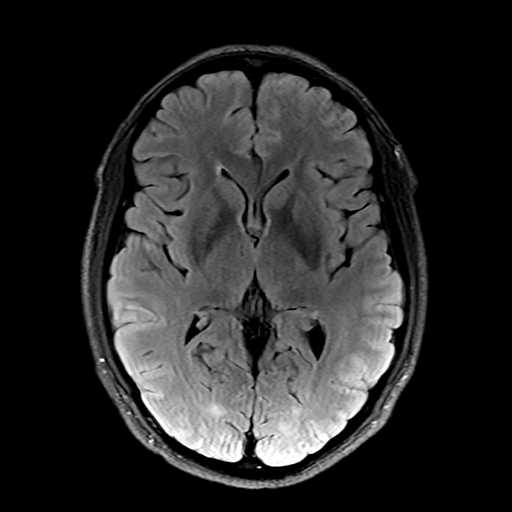
[im 35/35]
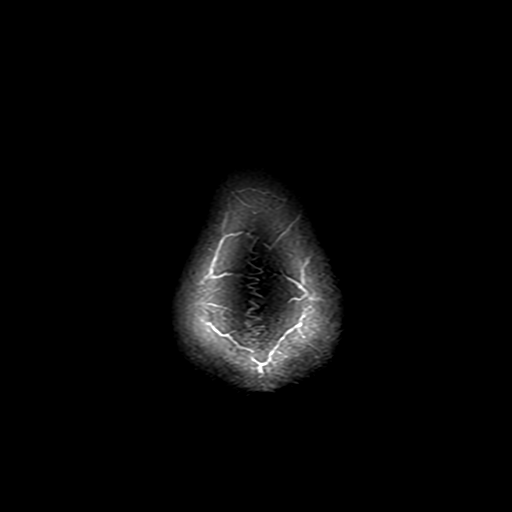

[Series 250: ADC · axial · 3.0mm · 0.94mm/px · z∈[-54,+86]mm · 4 of 46 slices shown (1 of 2)]
[im 1/46]
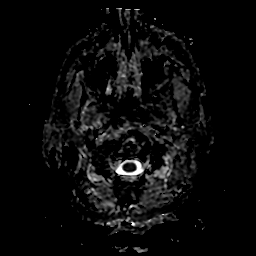
[im 16/46]
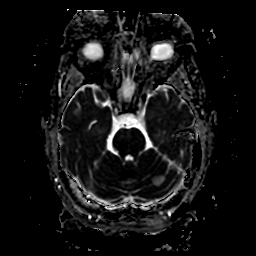
[im 31/46]
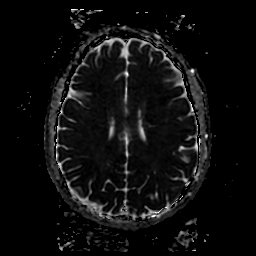
[im 46/46]
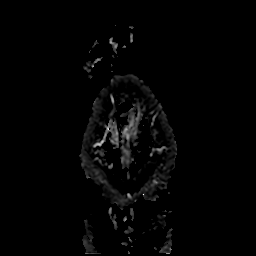

[Series 350: ADC · coronal · 4.0mm · 0.94mm/px · 3 of 37 slices shown (2 of 2)]
[im 1/37]
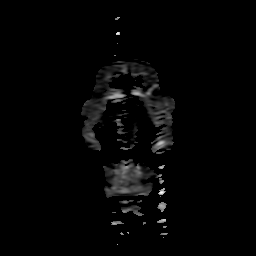
[im 19/37]
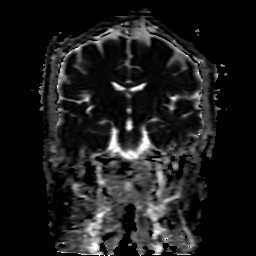
[im 37/37]
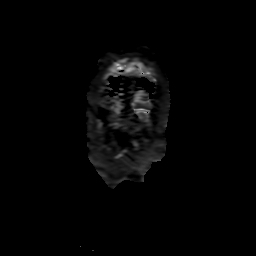

[27 of 48 positions shown; findings below may reference images not displayed]

FINDINGS: Brain: Evolution of patchy and confluent bilateral sore SHALLEY ir
infarcts since [DATE].

Solitary small new cerebellar infarct on the right series 2, image
13, 9-10 mm. No brainstem restricted diffusion now. Cerebellar T2
and FLAIR hyperintensity as expected. Only punctate petechial
hemorrhage in the right cerebellar hemisphere series 7, image 21
which is probably nonacute.

No supratentorial restricted diffusion to suggest acute infarction.
No midline shift, mass effect, evidence of mass lesion,
ventriculomegaly, extra-axial collection or acute intracranial
hemorrhage. Cervicomedullary junction and pituitary are within
normal limits. Minor supratentorial white matter T2 and FLAIR
hyperintensity appears stable. No cerebral cortical encephalomalacia
or supratentorial chronic blood products identified.

Vascular: Major intracranial vascular flow voids are improved
compared to [DATE]. Preserved basilar artery flow void.

Skull and upper cervical spine: Stable and negative visible cervical
spine, bone marrow signal.

Sinuses/Orbits: Stable, negative.

Other: Mastoids remain clear. Visible internal auditory structures
appear normal. Negative visible scalp and face.
IMPRESSION: 1. Solitary small new right cerebellar infarct since [DATE]
(series 2, image 13). No acute hemorrhage.

2. Otherwise expected evolution of multifocal posterior fossa
infarcts with no mass effect.

3. Vertebrobasilar flow voids significantly improved compared to
[DATE], stable to CTA appearance yesterday.

## 2021-10-31 NOTE — Progress Notes (Addendum)
Pt arrived on floor, pt hooked up to tele, V/S obtained, wife at bedside, bed alarm on, skin assessment completed and safety maintained. ? ?[id number] passed swallow screen. ?

## 2021-10-31 NOTE — Progress Notes (Addendum)
?      ?                 PROGRESS NOTE ? ?      ?PATIENT DETAILS ?Name: Christopher Pugh ?Age: 51 y.o. ?Sex: male ?Date of Birth: 28-Jun-1971 ?Admit Date: 10/30/2021 ?Admitting Physician Jonetta Osgood, MD ?[id number], Rebeca Alert, MD ? ?Brief Summary: ?51 y.o. 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  See below for further details ? ? ?Significant events: ?4/28>> sudden onset of vertigo/diplopia-MRI showed new acute right cerebellar infarct ? ?Significant studies: ?4/28>> CTA head/neck: No LVO, previously occluded intradural vertebral artery/basilar artery are patent-right V3 vertebral artery is severely narrowed and irregular in the region of the displaced right C1 lateral mass. ?4/28>>EEG:No seizure ?4/29>> MRI brain: Solitary small new right cerebellar infarct since 4/19. ?4/29>>B/L lower ext doppler:No DVT ? ?Significant microbiology data: ?4/28>> influenza/COVID PCR: Negative ? ?Procedures: ?None ? ?Consults: ?Neurology, neurosurgery ? ?Subjective: ?Lying comfortably in bed-denies any chest pain or shortness of breath. ? ?Objective: ?Vitals: ?Blood pressure (!) 119/94, pulse 75, temperature 97.7 ?F (36.5 ?C), temperature source Oral, resp. rate 18, SpO2 95 %.  ? ?Exam: ?Gen Exam:Alert awake-not in any distress ?HEENT:atraumatic, normocephalic ?Chest: B/L clear to auscultation anteriorly ?CVS:S1S2 regular ?Abdomen:soft non tender, non distended ?Extremities:no edema ?Neurology: Non focal ?Skin: no rash ? ?Pertinent Labs/Radiology: ? ?  Latest Ref Rng & Units 10/30/2021  ?  3:50 PM 10/30/2021  ?  2:34 PM 10/30/2021  ?  2:18 PM  ?CBC  ?WBC 4.0 - 10.5 K/uL 6.2    6.7    ?Hemoglobin 13.0 - 17.0 g/dL 13.8    14.6   15.2    ?Hematocrit 39.0 - 52.0 % 38.2   43.0   41.2    ?Platelets 150 - 400 K/uL 218    241    ?  ?Lab Results  ?Component Value Date  ? NA 138 10/30/2021  ? K 4.0 10/30/2021  ? CL 104 10/30/2021  ? CO2 23 10/30/2021  ?  ? ? ?Assessment/Plan: ?Acute right cerebellar infarct: No further diplopia/vertigo-discussed with stroke MD-concerned that displaced C1 fracture may be compressing on the vertebral artery causing recurrent CVA-new right cerebellar infarct seen on MRI on 4/29.  Awaiting neurosurgical input-in the meantime, continue dual antiplatelet agent/statin. ? ?Addendum 2:39 PM: Neurosurgical input noted-discussed with Dr. Francia Greaves discussed with neuro interventional radiology Dr. Lannie Fields are for cerebral angiogram tomorrow. ? ?(I have updated spouse/patient at bedside) ? ?HLD: Continue statin ? 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 ?BMI: ?Estimated body mass index is 28.89 kg/m? as calculated from the following: ?  Height as of 10/19/21: '5\' 8"'$  (1.727 m). ?  Weight as of 10/19/21: 86.2 kg.  ? ?Code status: ?  Code Status: Full Code  ? ?DVT Prophylaxis: ?enoxaparin (LOVENOX) injection 40 mg Start: 10/30/21 1700 ?  ?Family Communication: None at bedside ? ? ?Disposition Plan: ?Status is: Observation ?The patient will require care spanning > 2 midnights and should be moved to inpatient because: Recurrent acute CVA-suspicion for extrinsic compression of vertebral artery by C1 fracture fragment.  Awaiting neurosurgical input ?  ?Planned Discharge Destination:Home ? ? ?Diet: ?Diet Order   ? ?       ?  Diet Heart  Room service appropriate? Yes; Fluid consistency: Thin  Diet effective now       ?  ? ?  ?  ? ?  ?  ? ? ?Antimicrobial agents: ?Anti-infectives (From admission, onward)  ? ? None  ? ?  ? ? ? ?MEDICATIONS: ?Scheduled Meds: ? aspirin EC  81 mg Oral Daily  ? clopidogrel  75 mg Oral Daily  ? enoxaparin (LOVENOX) injection  40 mg Subcutaneous QHS  ? multivitamin with  minerals  1 tablet Oral Daily  ? rosuvastatin  40 mg Oral Daily  ? ?Continuous Infusions: ? sodium chloride 10 mL/hr at 10/30/21 1800  ? ?PRN Meds:.acetaminophen **OR** acetaminophen (TYLENOL) oral liquid 160 mg/5 mL **OR** acetaminophen, senna-docusate ? ? ?I have personally reviewed following labs and imaging studies ? ?LABORATORY DATA: ?CBC: ?Recent Labs  ?Lab 10/30/21 ?1418 10/30/21 ?1434 10/30/21 ?1550  ?WBC 6.7  --  6.2  ?NEUTROABS 4.9  --   --   ?HGB 15.2 14.6 13.8  ?HCT 41.2 43.0 38.2*  ?MCV 89.6  --  90.7  ?PLT 241  --  218  ? ? ?Basic Metabolic Panel: ?Recent Labs  ?Lab 10/30/21 ?1418 10/30/21 ?1434 10/30/21 ?1550  ?NA 136 138  --   ?K 3.9 4.0  --   ?CL 105 104  --   ?CO2 23  --   --   ?GLUCOSE 105* 105*  --   ?BUN 25* 27*  --   ?CREATININE 1.25* 1.30* 1.30*  ?CALCIUM 9.9  --   --   ? ? ?GFR: ?Estimated Creatinine Clearance: 71.8 mL/min (A) (by C-G formula based on SCr of 1.3 mg/dL (H)). ? ?Liver Function Tests: ?Recent Labs  ?Lab 10/30/21 ?1418  ?AST 33  ?ALT 36  ?ALKPHOS 43  ?BILITOT 0.9  ?PROT 7.2  ?ALBUMIN 4.6  ? ?No results for input(s): LIPASE, AMYLASE in the last 168 hours. ?No results for input(s): AMMONIA in the last 168 hours. ? ?Coagulation Profile: ?Recent Labs  ?Lab 10/30/21 ?1418  ?INR 1.0  ? ? ?Cardiac Enzymes: ?No results for input(s): CKTOTAL, CKMB, CKMBINDEX, TROPONINI in the last 168 hours. ? ?BNP (last 3 results) ?No results for input(s): PROBNP in the last 8760 hours. ? ?Lipid Profile: ?No results for input(s): CHOL, HDL, LDLCALC, TRIG, CHOLHDL, LDLDIRECT in the last 72 hours. ? ?Thyroid Function Tests: ?No results for input(s): TSH, T4TOTAL, FREET4, T3FREE, THYROIDAB in the last 72 hours. ? ?Anemia Panel: ?No results for input(s): VITAMINB12, FOLATE, FERRITIN, TIBC, IRON, RETICCTPCT in the last 72 hours. ? ?Urine analysis: ?   ?Component Value Date/Time  ? COLORURINE STRAW (A) 10/30/2021 1418  ? APPEARANCEUR CLEAR 10/30/2021 1418  ? LABSPEC 1.027 10/30/2021 1418  ? PHURINE 5.0  10/30/2021 1418  ? GLUCOSEU NEGATIVE 10/30/2021 1418  ? [place] NEGATIVE 10/30/2021 1418  ? [place] NEGATIVE 10/30/2021 1418  ? [place] NEGATIVE 10/30/2021 1418  ? PROTEINUR NEGATIVE 10/30/2021 1418  ? NITRITE NEGATIVE 10/30/2021 1418  ? LEUKOCYTESUR NEGATIVE 10/30/2021 1418  ? ? ?Sepsis Labs: ?Lactic Acid, Venous ?No results found for: LATICACIDVEN ? ?MICROBIOLOGY: ?Recent Results (from the past 240 hour(s))  ?Resp Panel by RT-PCR (Flu A&B, Covid) Nasopharyngeal Swab     Status: None  ? Collection Time: 10/30/21  4:00 PM  ? Specimen: Nasopharyngeal Swab; Nasopharyngeal(NP) swabs in vial transport medium  ?Result Value Ref Range Status  ? SARS Coronavirus 2 by RT PCR NEGATIVE NEGATIVE Final  ?  Comment: (NOTE) ?SARS-CoV-2 target nucleic acids are NOT DETECTED. ? ?The SARS-CoV-2 RNA is  generally detectable in upper respiratory ?specimens during the acute phase of infection. The lowest ?concentration of SARS-CoV-2 viral copies this assay can detect is ?138 copies/mL. A negative result does not preclude SARS-Cov-2 ?infection and should not be used as the sole basis for treatment or ?other patient management decisions. A negative result may occur with  ?improper specimen collection/handling, submission of specimen other ?than nasopharyngeal swab, presence of viral mutation(s) within the ?areas targeted by this assay, and inadequate number of viral ?copies(<138 copies/mL). A negative result must be combined with ?clinical observations, patient history, and epidemiological ?information. The expected result is Negative. ? ?Fact Sheet for Patients:  ?EntrepreneurPulse.com.au ? ?Fact Sheet for Healthcare Providers:  ?IncredibleEmployment.be ? ?This test is no t yet approved or cleared by the [place] FDA and  ?has been authorized for detection and/or diagnosis of SARS-CoV-2 by ?FDA under an Emergency Use Authorization (EUA). This EUA will remain  ?in effect (meaning this test can  be used) for the duration of the ?COVID-19 declaration under Section 564(b)(1) of the Act, 21 ?U.S.C.section 360bbb-3(b)(1), unless the authorization is terminated  ?or revoked sooner.  ? ? ?  ? Influenza A by [facility]

## 2021-10-31 NOTE — Progress Notes (Signed)
STROKE TEAM PROGRESS NOTE  ? ?SUBJECTIVE (INTERVAL HISTORY) ?His wife is at the bedside.  Overall his condition is completely resolved.  Patient not at baseline, he stated that yesterday he had to walk outside, came in, having shower, this started have tinnitus bilaterally, diplopia for which he was sent to ER for evaluation.  However, after CT, all symptoms resolved.  CT head and neck concerning for C1 fracture fragment narrowing right VA.  MRI showed solitary right PICA small infarct. ? ? ?OBJECTIVE ?Temp:  [97.7 ?F (36.5 ?C)-98.5 ?F (36.9 ?C)] 97.7 ?F (36.5 ?C) (04/29 0810) ?Pulse Rate:  [66-75] 75 (04/29 0442) ?Cardiac Rhythm: Normal sinus rhythm (04/29 1900) ?Resp:  [15-19] 15 (04/29 1606) ?BP: (115-122)/(79-94) 116/79 (04/29 1606) ?SpO2:  [95 %] 95 % (04/28 2045) ? ?Recent Labs  ?Lab 10/30/21 ?1425  ?GLUCAP 104*  ? ?Recent Labs  ?Lab 10/30/21 ?1418 10/30/21 ?1434 10/30/21 ?1550  ?NA 136 138  --   ?K 3.9 4.0  --   ?CL 105 104  --   ?CO2 23  --   --   ?GLUCOSE 105* 105*  --   ?BUN 25* 27*  --   ?CREATININE 1.25* 1.30* 1.30*  ?CALCIUM 9.9  --   --   ? ?Recent Labs  ?Lab 10/30/21 ?1418  ?AST 33  ?ALT 36  ?ALKPHOS 43  ?BILITOT 0.9  ?PROT 7.2  ?ALBUMIN 4.6  ? ?Recent Labs  ?Lab 10/30/21 ?1418 10/30/21 ?1434 10/30/21 ?1550  ?WBC 6.7  --  6.2  ?NEUTROABS 4.9  --   --   ?HGB 15.2 14.6 13.8  ?HCT 41.2 43.0 38.2*  ?MCV 89.6  --  90.7  ?PLT 241  --  218  ? ?No results for input(s): CKTOTAL, CKMB, CKMBINDEX, TROPONINI in the last 168 hours. ?Recent Labs  ?  10/30/21 ?1418  ?LABPROT 12.7  ?INR 1.0  ? ?Recent Labs  ?  10/30/21 ?1418  ?COLORURINE STRAW*  ?LABSPEC 1.027  ?PHURINE 5.0  ?GLUCOSEU NEGATIVE  ?HGBUR NEGATIVE  ?BILIRUBINUR NEGATIVE  ?KETONESUR NEGATIVE  ?PROTEINUR NEGATIVE  ?NITRITE NEGATIVE  ?LEUKOCYTESUR NEGATIVE  ?  ?   ?Component Value Date/Time  ? CHOL 193 10/19/2021 1120  ? TRIG 34 10/19/2021 1120  ? HDL 54 10/19/2021 1120  ? CHOLHDL 3.6 10/19/2021 1120  ? VLDL 7 10/19/2021 1120  ? LDLCALC 132 (H) 10/19/2021  1120  ? ?Lab Results  ?Component Value Date  ? HGBA1C 5.3 10/19/2021  ? ?   ?Component Value Date/Time  ? [place] DETECTED 10/30/2021 1418  ? [place] DETECTED 10/30/2021 1418  ? [place] DETECTED 10/30/2021 1418  ? AMPHETMU NONE DETECTED 10/30/2021 1418  ? [place] DETECTED 10/30/2021 1418  ? LABBARB NONE DETECTED 10/30/2021 1418  ?  ?Recent Labs  ?Lab 10/30/21 ?1421  ?ETH <10  ? ? ?I have personally reviewed the radiological images below and agree with the radiology interpretations. ? ?CT HEAD WO CONTRAST ? ?Result Date: 10/19/2021 ?CLINICAL DATA:  Dizziness, aphasia. EXAM: CT HEAD WITHOUT CONTRAST TECHNIQUE: Contiguous axial images were obtained from the base of the skull through the vertex without intravenous contrast. RADIATION DOSE REDUCTION: This exam was performed according to the departmental dose-optimization program which includes automated exposure control, adjustment of the mA and/or kV according to patient size and/or use of iterative reconstruction technique. COMPARISON:  Outside head CT and cervical spine CT dated 04/01/2020. FINDINGS: Brain: Patchy low-density areas within the cerebellum, largest within the LEFT cerebellum measuring 2.2 cm, suggesting subacute to chronic  infarcts. No mass, hemorrhage, edema or other evidence of acute parenchymal abnormality is seen within the supratentorial brain. No midline shift or evidence of herniation. Vascular: No hyperdense vessel or unexpected calcification. Skull: Normal. Negative for fracture or focal lesion. Sinuses/Orbits: Fluid level within the RIGHT maxillary sinus. Remainder of the visualized paranasal sinuses are clear. Periorbital and retro-orbital soft tissues are unremarkable. Other: Displaced/comminuted fractures of the C1 vertebral body are incompletely imaged but are similar to the visualized portion of the upper cervical spine as seen on outside head CT of 04/01/2020, and presumably chronic. IMPRESSION: 1. Patchy low-density  areas within the cerebellum, largest within the LEFT cerebellum measuring 2.2 cm, most likely subacute to chronic infarcts, possibly sequela of a previous trauma, underlying neoplastic process not excluded. Consider brain MRI with contrast for further characterization. 2. No intracranial hemorrhage. 3. Displaced/comminuted fractures of the C1 vertebral body, incompletely imaged but visualized portion similar to the fractures seen on cervical spine CT of 04/01/2020. Recommend cervical spine CT to compared directly with the earlier outside CT and consider surgical consultation to evaluate for instability and/or possible need for surgical fixation. 4. Fluid level within the RIGHT maxillary sinus, suggesting acute sinusitis. Critical Value/emergent results were called by telephone at the time of interpretation on 10/19/2021 at 8:20 am to provider [facility] , who verbally acknowledged these results. Electronically Signed   By: Franki Cabot M.D.   On: 10/19/2021 08:22  ? ?MR ANGIO HEAD WO CONTRAST ? ?Result Date: 10/21/2021 ?CLINICAL DATA:  Stroke, follow up; post mechanical thrombectomy for basilar artery occlusion EXAM: MRI HEAD WITHOUT CONTRAST MRA HEAD WITHOUT CONTRAST TECHNIQUE: Multiplanar, multi-echo pulse sequences of the brain and surrounding structures were acquired without intravenous contrast. Angiographic images of the Circle of Willis were acquired using MRA technique without intravenous contrast. COMPARISON:  10/19/2021 FINDINGS: MRI HEAD DWI and sagittal T1 sequences obtained. Acute bilateral cerebellar infarcts are again identified. Extent of involvement is mildly greater. No significant mass effect. No hydrocephalus. MRA HEAD Intracranial internal carotid arteries are patent. Middle and anterior cerebral arteries are patent. Intracranial vertebral arteries are patent. Basilar artery is patent. Patent left PICA and AICA origins identified. Patent bilateral SCA origins. Bilateral posterior communicating  arteries are present. Patent bilateral posterior cerebral arteries with flow primarily from the posterior communicating arteries. IMPRESSION: Acute bilateral cerebellar infarcts with mild increase in extent of involvement. No significant mass effect. Patent vertebral and basilar arteries. Electronically Signed   By: Macy Mis M.D.   On: 10/21/2021 11:26  ? ?MR BRAIN WO CONTRAST ? ?Result Date: 10/31/2021 ?CLINICAL DATA:  51 year old male neurologic deficit. Bilateral distal vertebral artery and proximal basilar thrombosis with bilateral cerebellar infarcts earlier this month. Code stroke presentation yesterday. EXAM: MRI HEAD WITHOUT CONTRAST TECHNIQUE: Multiplanar, multiecho pulse sequences of the brain and surrounding structures were obtained without intravenous contrast. COMPARISON:  CT head and CTA head and neck yesterday. Brain MRI and MRA 10/21/2021 and earlier. FINDINGS: Brain: Evolution of patchy and confluent bilateral sore a bell ir infarcts since 10/19/2021. Solitary small new cerebellar infarct on the right series 2, image 13, 9-10 mm. No brainstem restricted diffusion now. Cerebellar T2 and FLAIR hyperintensity as expected. Only punctate petechial hemorrhage in the right cerebellar hemisphere series 7, image 21 which is probably nonacute. No supratentorial restricted diffusion to suggest acute infarction. No midline shift, mass effect, evidence of mass lesion, ventriculomegaly, extra-axial collection or acute intracranial hemorrhage. Cervicomedullary junction and pituitary are within normal limits. Minor supratentorial white matter T2 and  FLAIR hyperintensity appears stable. No cerebral cortical encephalomalacia or supratentorial chronic blood products identified. Vascular: Major intracranial vascular flow voids are improved compared to 10/19/2021. Preserved basilar artery flow void. Skull and upper cervical spine: Stable and negative visible cervical spine, bone marrow signal. Sinuses/Orbits:  Stable, negative. Other: Mastoids remain clear. Visible internal auditory structures appear normal. Negative visible scalp and face. IMPRESSION: 1. Solitary small new right cerebellar infarct since 04/19/2

## 2021-10-31 NOTE — Progress Notes (Signed)
Agree with completion of above... This is not an area that we can operate and decompress his vertebral artery directly.  Agree with interventional evaluation with angiography to see if there is any intravascular option here.  We can follow the patient down the road to further evaluate his fracture however the fracture appears to be stable and in the same position on coronal and sagittal images as previous imaging. ?

## 2021-10-31 NOTE — Progress Notes (Signed)
PT Cancellation Note ? ?Patient Details ?Name: Christopher Pugh ?MRN: [id number] ?DOB: 1970/08/10 ? ? ?Cancelled Treatment:    Reason Eval/Treat Not Completed: PT screened, no needs identified, will sign off ? ?Patient has been up walking and reports his symptoms are back to his baseline from prior CVAs. Described exercises he has been doing at [facility] and will need referral to resume OPPT upon discharge.  ? ? ?[name], PT ?Acute Rehabilitation Services  ?Pager [phone] ?Office [phone] ? ?[name] ?10/31/2021, 8:49 AM ?

## 2021-10-31 NOTE — Progress Notes (Signed)
Called in regards to this patients ct angio. He suffered a C1 fracture 1 year ago and was managed in a collar by Dr. Annette Stable. He presented to the ED twice in the last two weeks with strokes. Appears he may have a fragment on his vertebral artery. However, the patient is on plavix and he is certainly not a candidate for surgery given this. Would recommend reaching out to IR to see if stenting across that would be a possibility. No neurosurgical role at this point.  ?

## 2021-10-31 NOTE — Plan of Care (Signed)
?  Problem: Clinical Measurements: ?Goal: Diagnostic test results will improve ?Outcome: Progressing ?  ?Problem: Pain Managment: ?Goal: General experience of comfort will improve ?Outcome: Progressing ?  ?Problem: Safety: ?Goal: Ability to remain free from injury will improve ?Outcome: Progressing ?  ?

## 2021-10-31 NOTE — Progress Notes (Signed)
Lower extremity venous has been completed.  ? ?Preliminary results in CV Proc.  ? ?Jodeci Roarty Tika Hannis ?10/31/2021 2:32 PM    ?

## 2021-10-31 NOTE — Plan of Care (Signed)
?  Problem: Clinical Measurements: ?Goal: Diagnostic test results will improve ?Outcome: Progressing ?  ?Problem: Pain Managment: ?Goal: General experience of comfort will improve ?Outcome: Progressing ?  ?Problem: Safety: ?Goal: Ability to remain free from injury will improve ?Outcome: Progressing ?  ?Problem: Education: ?Goal: Knowledge of disease or condition will improve ?Outcome: Progressing ?Goal: Knowledge of secondary prevention will improve (SELECT ALL) ?Outcome: Progressing ?  ?

## 2021-10-31 NOTE — Progress Notes (Signed)
OT Cancellation Note ? ?Patient Details ?Name: Christopher Pugh ?MRN: [id number] ?DOB: Nov 03, 1970 ? ? ?Cancelled Treatment:    Reason Eval/Treat Not Completed: OT screened, no needs identified, will sign off. Per pt he is back to his baseline from prior CVA's. He will need to get set back up with OP services upon discharge from hospital. Otherwise, pt has no further OT needs and acute OT will sign off.  ? ?[name] ?10/31/2021, 10:03 AM ?

## 2021-10-31 NOTE — Progress Notes (Signed)
? ? ?Referring Physician(s): ?Erlinda Hong ? ?Supervising Physician: Luanne Bras ? ?Patient Status:  [facility] - In-pt ? ?Chief Complaint: ? ?Vertebral artery stenosis ? ?HPI: ? ?Christopher Pugh is a 51 y.o. 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  ? ?He is s/p mechanical thrombectomy for treatment of an occlusion of the proximal basilar artery with complete recanalization. ? ?He was discharged on dual antiplatelet therapy. ? ?He presented having sudden onset of ringing in both ears, diplopia, and dizziness.  ? ?CTA showed= The right V3 vertebral artery is severely narrowed and irregular ?in the region of displaced right C1 lateral mass. ? ?We are asked to perform a diagnostic cerebral angiogram. ? ?Allergies: ?Patient has no known allergies. ? ?Medications: ?Prior to Admission medications   ?Medication Sig Start Date End Date Taking? Authorizing Provider  ?aspirin 81 MG EC tablet Take 1 tablet (81 mg total) by mouth daily. Swallow whole. 10/22/21  Yes Rosalin Hawking, MD  ?Cholecalciferol (D3 PO) Take 1 capsule by mouth daily.   Yes [provider]  ?clopidogrel (PLAVIX) 75 MG tablet Take 1 tablet (75 mg total) by mouth daily. 10/22/21  Yes Rosalin Hawking, MD  ?ibuprofen (ADVIL) 200 MG tablet Take 600 mg by mouth every 6 (six) hours as needed for headache or mild pain.   Yes [provider]  ?Multiple Vitamin (MULTIVITAMIN) tablet Take 1 tablet by mouth daily.   Yes [provider]  ?rosuvastatin (CRESTOR) 20 MG tablet Take 1 tablet (20 mg total) by mouth daily. 10/22/21  Yes Rosalin Hawking, MD  ?tadalafil (CIALIS) 5 MG tablet Take 5 mg by mouth daily. 11/13/19  Yes [provider]  ? ? ? ?Vital Signs: ?BP (!) 119/94 (BP Location: Right Arm)   Pulse 75   Temp 97.7 ?F (36.5 ?C) (Oral)   Resp 18   SpO2 95%  ? ?Physical Exam ?Vitals reviewed.  ?Constitutional:   ?   Appearance: Normal appearance.  ?HENT:  ?   Head: Normocephalic  and atraumatic.  ?Eyes:  ?   Extraocular Movements: Extraocular movements intact.  ?Cardiovascular:  ?   Rate and Rhythm: Normal rate and regular rhythm.  ?Pulmonary:  ?   Effort: Pulmonary effort is normal. No respiratory distress.  ?   Breath sounds: Normal breath sounds.  ?Abdominal:  ?   General: There is no distension.  ?   Palpations: Abdomen is soft.  ?   Tenderness: There is no abdominal tenderness.  ?Musculoskeletal:     ?   General: Normal range of motion.  ?   Cervical back: Normal range of motion.  ?Skin: ?   General: Skin is warm and dry.  ?Neurological:  ?   General: No focal deficit present.  ?   Mental Status: He is alert and oriented to person, place, and time.  ?Psychiatric:     ?   Mood and Affect: Mood normal.     ?   Behavior: Behavior normal.     ?   Thought Content: Thought content normal.     ?   Judgment: Judgment normal.  ? ? ?Imaging: ?MR BRAIN WO CONTRAST ? ?Result Date: 10/31/2021 ?CLINICAL DATA:  51 year old male neurologic deficit. Bilateral distal vertebral artery and proximal basilar thrombosis with bilateral cerebellar infarcts earlier this month. Code stroke presentation yesterday. EXAM: MRI HEAD WITHOUT CONTRAST TECHNIQUE: Multiplanar, multiecho pulse sequences of the brain and surrounding structures were obtained without intravenous contrast. COMPARISON:  CT head and  CTA head and neck yesterday. Brain MRI and MRA 10/21/2021 and earlier. FINDINGS: Brain: Evolution of patchy and confluent bilateral sore a bell ir infarcts since 10/19/2021. Solitary small new cerebellar infarct on the right series 2, image 13, 9-10 mm. No brainstem restricted diffusion now. Cerebellar T2 and FLAIR hyperintensity as expected. Only punctate petechial hemorrhage in the right cerebellar hemisphere series 7, image 21 which is probably nonacute. No supratentorial restricted diffusion to suggest acute infarction. No midline shift, mass effect, evidence of mass lesion, ventriculomegaly, extra-axial  collection or acute intracranial hemorrhage. Cervicomedullary junction and pituitary are within normal limits. Minor supratentorial white matter T2 and FLAIR hyperintensity appears stable. No cerebral cortical encephalomalacia or supratentorial chronic blood products identified. Vascular: Major intracranial vascular flow voids are improved compared to 10/19/2021. Preserved basilar artery flow void. Skull and upper cervical spine: Stable and negative visible cervical spine, bone marrow signal. Sinuses/Orbits: Stable, negative. Other: Mastoids remain clear. Visible internal auditory structures appear normal. Negative visible scalp and face. IMPRESSION: 1. Solitary small new right cerebellar infarct since 10/21/2021 (series 2, image 13). No acute hemorrhage. 2. Otherwise expected evolution of multifocal posterior fossa infarcts with no mass effect. 3. Vertebrobasilar flow voids significantly improved compared to 10/19/2017, stable to CTA appearance yesterday. Electronically Signed   By: Genevie Ann M.D.   On: 10/31/2021 11:17  ? ?EEG adult ? ?Result Date: 10/30/2021 ?Lora Havens, MD     10/30/2021  6:47 PM Patient Name: Christopher Pugh MRN: [id number] Epilepsy Attending: Lora Havens Referring Physician/Provider: Jonetta Osgood, MD Date: 10/30/2021 Duration: 22.04 mins  Patient history: 51 y.o. male.  Who presented with sudden onset of ringing in both ears as well as diplopia and dizziness this afternoon at 2 PM. EEG to evaluate for seizure  Level of alertness: Awake, drowsy  AEDs: None  Technical aspects: This EEG study was done with scalp electrodes positioned according to the 10-20 International system of electrode placement. Electrical activity was acquired at a sampling rate of '500Hz'$  and reviewed with a high frequency filter of '70Hz'$  and a low frequency filter of '1Hz'$ . EEG data were recorded continuously and digitally stored.  Description: The posterior dominant rhythm consists of 9 Hz activity of moderate  voltage (25-35 uV) seen predominantly in posterior head regions, symmetric and reactive to eye opening and eye closing. Drowsiness was characterized by attenuation of the posterior background rhythm.  Hyperventilation and photic stimulation were not performed.    IMPRESSION: This study is within normal limits. No seizures or epileptiform discharges were seen throughout the recording.  Priyanka Barbra Sarks   ? ?CT HEAD CODE STROKE WO CONTRAST ? ?Result Date: 10/30/2021 ?CLINICAL DATA:  Code stroke.  Neuro deficit, acute, stroke suspected EXAM: CT HEAD WITHOUT CONTRAST TECHNIQUE: Contiguous axial images were obtained from the base of the skull through the vertex without intravenous contrast. RADIATION DOSE REDUCTION: This exam was performed according to the departmental dose-optimization program which includes automated exposure control, adjustment of the mA and/or kV according to patient size and/or use of iterative reconstruction technique. COMPARISON:  Recent CT and MR imaging FINDINGS: Brain: There is no acute intracranial hemorrhage. Evolving recent cerebellar infarcts are again noted. No significant mass effect. No new loss of gray-white differentiation. No extra-axial collection. Vascular: No hyperdense vessel. Skull: Unremarkable. Sinuses/Orbits: No acute abnormality. Other: Mastoid air cells are clear. ASPECTS [facility] Stroke Program Early CT Score) - Ganglionic level infarction (caudate, lentiform nuclei, internal capsule, insula, M1-M3 cortex): 7 - Supraganglionic infarction (M4-M6 cortex):  3 Total score (0-10 with 10 being normal): 10 IMPRESSION: No acute intracranial hemorrhage or new infarction. Evolving recent bilateral cerebellar infarcts. These results were communicated to Dr. Leonie Man at 2:48 pm on 10/30/2021 by text page via the [facility] messaging system. Electronically Signed   By: Macy Mis M.D.   On: 10/30/2021 14:50  ? ?VAS [place] LOWER EXTREMITY VENOUS (DVT) ? ?Result Date: 10/31/2021 ? Lower Venous DVT  Study Patient Name:  Christopher Pugh  Date of Exam:   10/31/2021 Medical Rec #: [id number]       Accession #:    [id number] Date of Birth: [birth year]        Patient Gender: M Patient Age:   43 years Exam Location:  [facility]

## 2021-11-01 ENCOUNTER — Inpatient Hospital Stay (HOSPITAL_COMMUNITY): Payer: BC Managed Care – PPO

## 2021-11-01 DIAGNOSIS — E785 Hyperlipidemia, unspecified: Secondary | ICD-10-CM | POA: Diagnosis not present

## 2021-11-01 DIAGNOSIS — S12040S Displaced lateral mass fracture of first cervical vertebra, sequela: Secondary | ICD-10-CM | POA: Diagnosis not present

## 2021-11-01 DIAGNOSIS — S12001S Unspecified nondisplaced fracture of first cervical vertebra, sequela: Secondary | ICD-10-CM

## 2021-11-01 DIAGNOSIS — I639 Cerebral infarction, unspecified: Secondary | ICD-10-CM

## 2021-11-01 DIAGNOSIS — I63211 Cerebral infarction due to unspecified occlusion or stenosis of right vertebral arteries: Secondary | ICD-10-CM | POA: Diagnosis not present

## 2021-11-01 HISTORY — PX: IR ANGIO VERTEBRAL SEL VERTEBRAL BILAT MOD SED: IMG5369

## 2021-11-01 IMAGING — XA IR ANGIO VETEBRAL SEL VERTEBRAL BILAT MOD SED
10 of 12 series · 13 of 24 positions shown · IV contrast (IODINE)
Comparison: MRI of the brain [DATE] and CT angiogram of
the head and neck [DATE].

CLINICAL DATA: New onset of bilateral tinnitus and diplopia. New
cerebellar ischemic stroke on MRI of brain.

EXAM:
IR ANGIO VERTEBRAL SEL VERTEBRAL BILAT MOD SED
TECHNIQUE: Informed written consent was obtained from the patient after a
thorough discussion of the procedural risks, benefits and
alternatives. All questions were addressed. Maximal Sterile Barrier
Technique was utilized including caps, mask, sterile gowns, sterile
gloves, sterile drape, hand hygiene and skin antiseptic. A timeout
was performed prior to the initiation of the procedure.

[Series 1: single · 1 of 2 slices shown (1 of 2)]
[im 1/2]
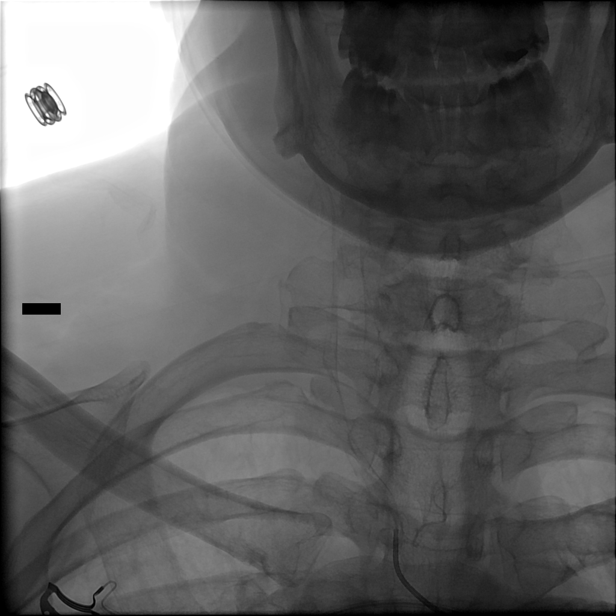

[Series 2: cerebral · 2 acquisitions, 1 frame shown (1 of 7)]
[im 1/2]
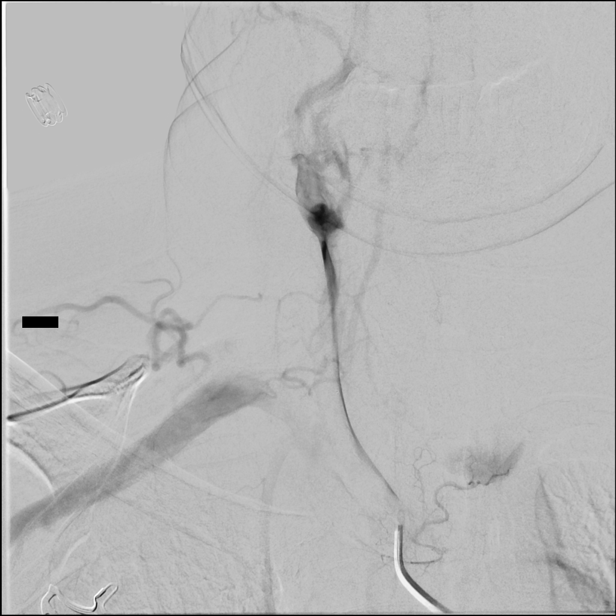

[Series 4: single · 1 of 2 slices shown (2 of 2)]
[im 1/2]
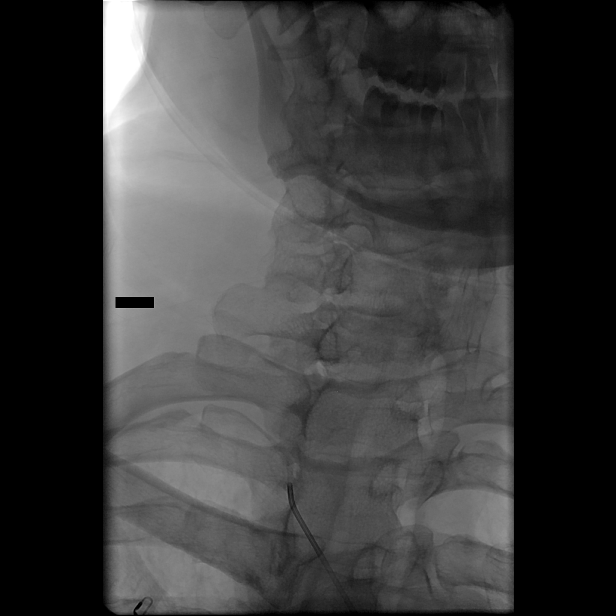

[Series 5: cerebral · 2 acquisitions, 1 frame shown (2 of 7)]
[im 1/2]
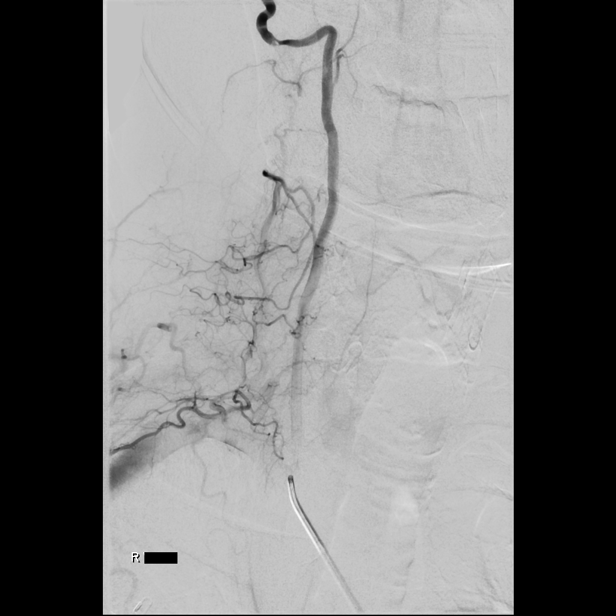

[Series 6: cerebral · 2 acquisitions, 1 frame shown (3 of 7)]
[im 1/2]
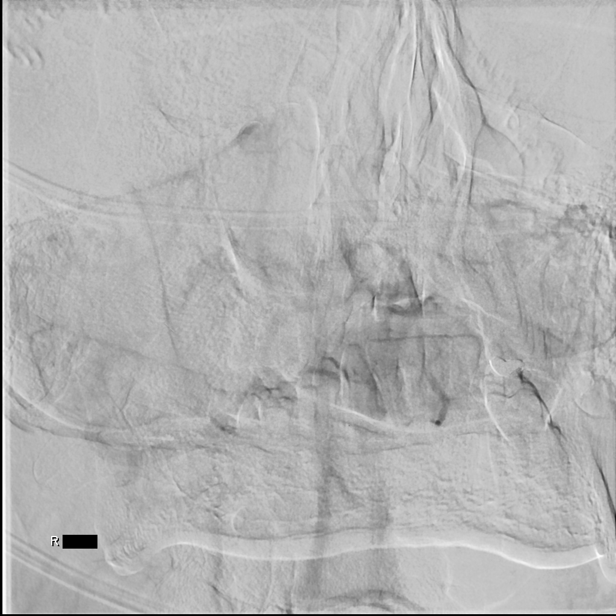

[Series 8: cerebral · 2 acquisitions, 1 frame shown (4 of 7)]
[im 1/2]
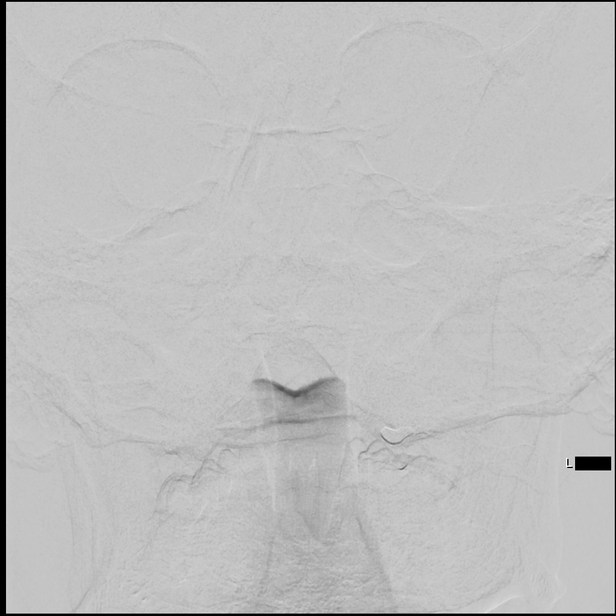

[Series 9: cerebral · 2 acquisitions, 2 frames shown (5 of 7)]
[im 1/2  full-range]
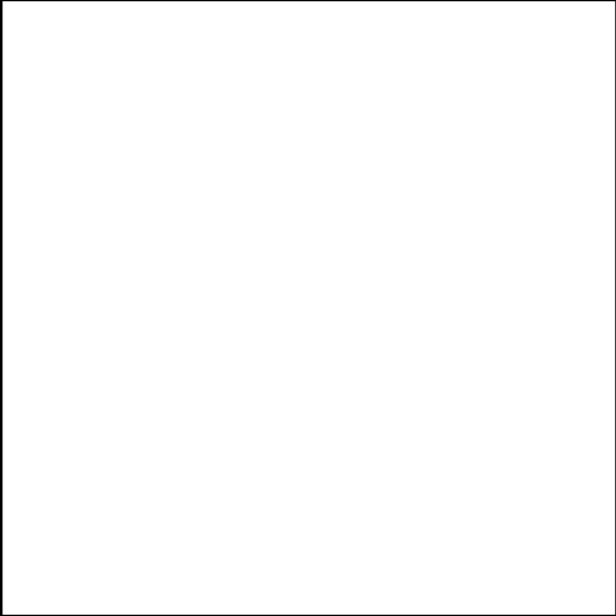
[im 1/2]
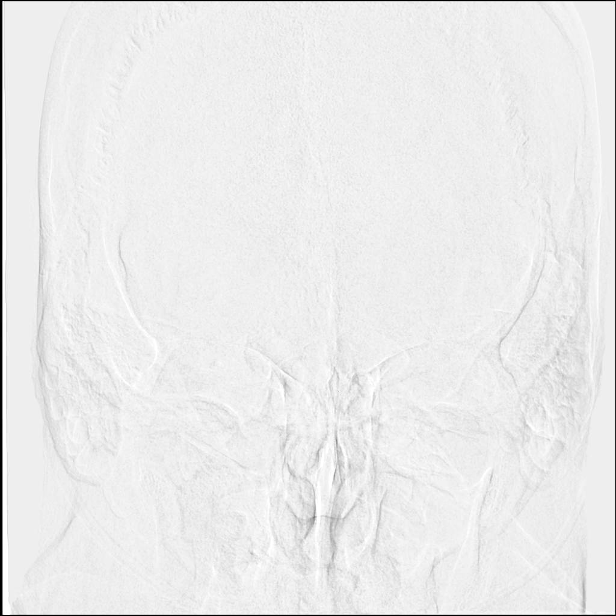

[Series 10: cerebral · 2 acquisitions, 1 frame shown (6 of 7)]
[im 1/2]
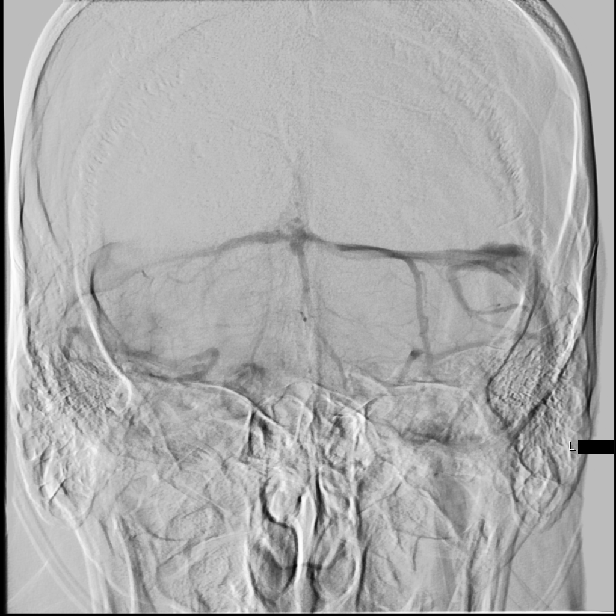

[Series 11: cerebral · 2 acquisitions, 1 frame shown (7 of 7)]
[im 1/2]
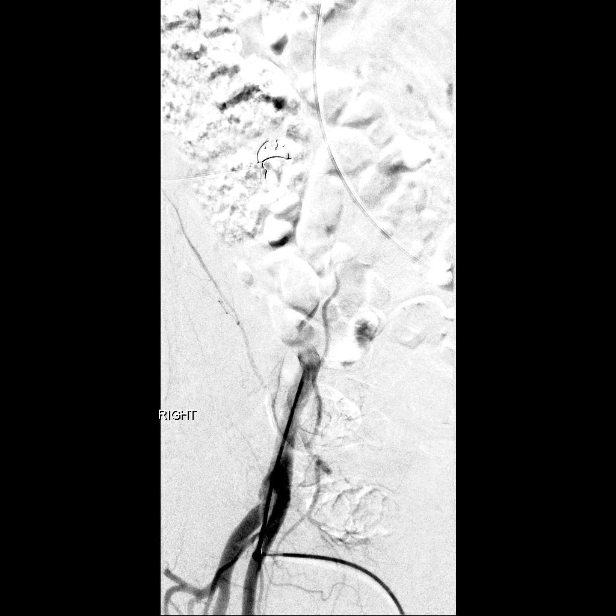

[Series 300: dr. (person_name) · 3 of 174 slices shown]
[im 44/174]
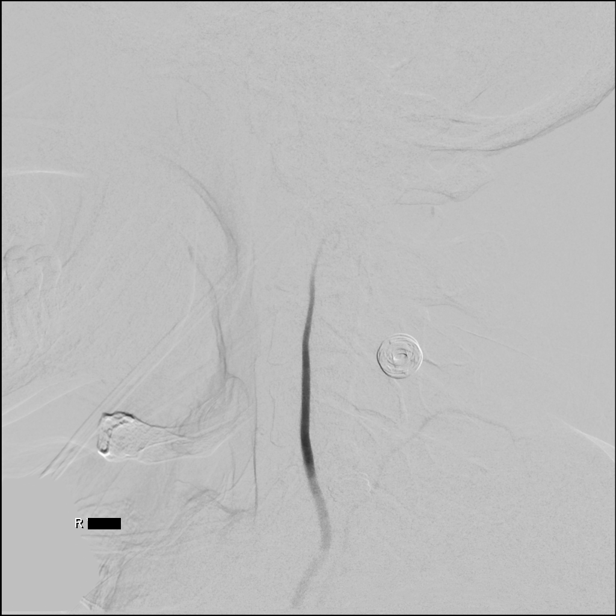
[im 116/174]
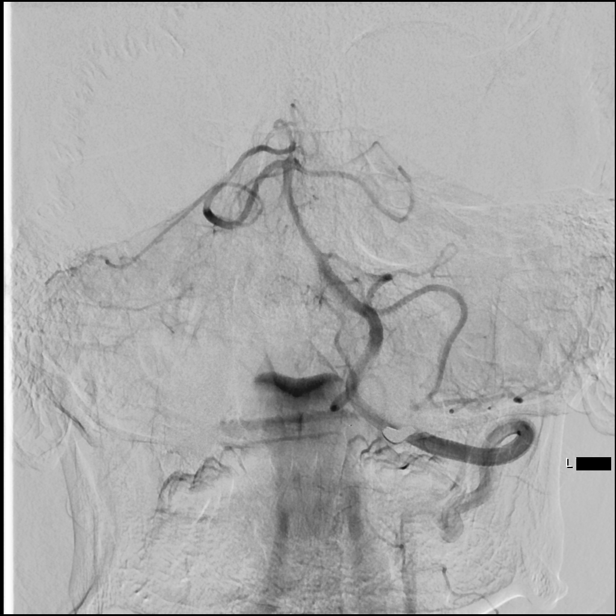
[im 174/174]
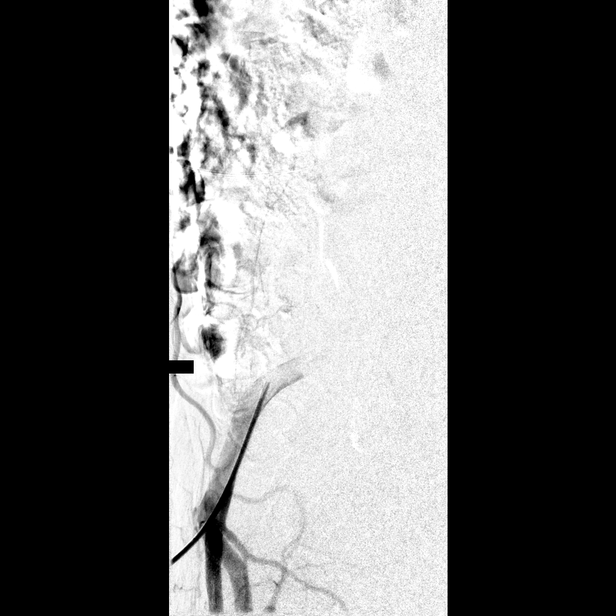

[13 of 24 positions shown; findings below may reference images not displayed]

MEDICATIONS:
Heparin [UA] units IV. No antibiotic was administered within 1 hour
of the procedure.

ANESTHESIA/SEDATION:
Versed 1 mg IV; Fentanyl 50 mcg IV

Moderate Sedation Time:  30 minutes

The patient was continuously monitored during the procedure by the
interventional radiology nurse under my direct supervision.

CONTRAST:  Omnipaque 300 approximately 60 mL.

FLUOROSCOPY TIME:  Fluoroscopy Time: 7 minutes 54 seconds (743 mGy).

COMPLICATIONS:
None immediate.
The right groin was prepped and draped in the usual sterile fashion.
Thereafter using modified Seldinger technique, transfemoral access
into the right common femoral artery was obtained without
difficulty. Over a 0.035 inch guidewire, a 5 French Pinnacle sheath
was inserted. Through this, and also over 0.035 inch guidewire, a 5
French JB 1 catheter was advanced to the aortic arch region and
selectively positioned in the right vertebral artery, and the left
vertebral artery.
FINDINGS: The right vertebral artery origin is widely patent.

The vessel is seen to opacify to the cranial skull base. There is a
severe high-grade stenosis of the right vertebral artery in the
first horizontal segment at C1. Distal to this flow is noted into
the right vertebrobasilar junction and faintly the right
posterior-inferior cerebellar artery.

The opacified basilar artery demonstrates wide patency with
opacification of the superior cerebellar arteries and the left
anterior inferior cerebellar artery/posteroinferior cerebellar
artery complex. Left vertebral artery origin is widely patent.

The vessel opacifies to the cranial skull base. Wide patency is seen
of the left vertebrobasilar junction and the left anterior inferior
cerebellar artery/posteroinferior cerebellar artery complex.

The superior cerebellar arteries demonstrate patency. A miniscule
faint opacification is seen in the right anterior inferior
cerebellar artery region with retrograde opacification of the right
anterior inferior cerebellar artery distribution in the cortical and
subcortical regions via the pial collaterals from the right superior
cerebellar artery.

IMPRESSION.:
IMPRESSION.
Severe high-grade stenosis of the first horizontal segment of the
right vertebral artery at C1.

Faint opacification of the right anterior inferior cerebellar artery
with retrograde opacification of the right anterior inferior
cerebellar artery territory via pial collaterals arising from the
right superior cerebellar artery.

PLAN:
Discuss endovascular treatment versus medical management of the
high-grade stenosis of the right vertebral artery at C1 probably
related to a dissection.

## 2021-11-01 MED ORDER — MIDAZOLAM HCL 2 MG/2ML IJ SOLN
INTRAMUSCULAR | Status: AC | PRN
  Administered 2021-11-01: 1 mg via INTRAVENOUS

## 2021-11-01 MED ORDER — ROSUVASTATIN CALCIUM 20 MG PO TABS
40.0000 mg | ORAL_TABLET | Freq: Every day | ORAL | 3 refills | Status: DC
  Filled 2021-11-01: qty 90, 45d supply, fill #0

## 2021-11-01 MED ORDER — LIDOCAINE HCL 1 % IJ SOLN
INTRAMUSCULAR | Status: AC
  Filled 2021-11-01: qty 20

## 2021-11-01 MED ORDER — IOHEXOL 300 MG/ML  SOLN
100.0000 mL | Freq: Once | INTRAMUSCULAR | Status: AC | PRN
  Administered 2021-11-01: 10 mL via INTRA_ARTERIAL

## 2021-11-01 MED ORDER — HEPARIN SOD (PORK) LOCK FLUSH 100 UNIT/ML IV SOLN
INTRAVENOUS | Status: AC
  Filled 2021-11-01: qty 5

## 2021-11-01 MED ORDER — HEPARIN SODIUM (PORCINE) 1000 UNIT/ML IJ SOLN
INTRAMUSCULAR | Status: AC | PRN
  Administered 2021-11-01: 1000 [IU] via INTRAVENOUS

## 2021-11-01 MED ORDER — LIDOCAINE HCL 1 % IJ SOLN
INTRAMUSCULAR | Status: AC | PRN
  Administered 2021-11-01 (×2): 10 mL

## 2021-11-01 MED ORDER — MIDAZOLAM HCL 2 MG/2ML IJ SOLN
INTRAMUSCULAR | Status: AC
  Filled 2021-11-01: qty 2

## 2021-11-01 MED ORDER — FENTANYL CITRATE (PF) 100 MCG/2ML IJ SOLN
INTRAMUSCULAR | Status: AC
  Filled 2021-11-01: qty 2

## 2021-11-01 MED ORDER — SODIUM CHLORIDE 0.9 % IV SOLN: INTRAVENOUS | Status: DC

## 2021-11-01 MED ORDER — FENTANYL CITRATE (PF) 100 MCG/2ML IJ SOLN
INTRAMUSCULAR | Status: AC | PRN
  Administered 2021-11-01 (×2): 25 ug via INTRAVENOUS

## 2021-11-01 MED ORDER — IOHEXOL 300 MG/ML  SOLN
100.0000 mL | Freq: Once | INTRAMUSCULAR | Status: AC | PRN
  Administered 2021-11-01: 70 mL via INTRA_ARTERIAL

## 2021-11-01 NOTE — Procedures (Signed)
INR. ?Bilateral vertebral artery angiograms. ? ?Right CFA approach. ? ?Findings. ? ?1.  Severe tapered stenosis of the horizontal segment of the right vertebral associated with an intimal flap. ? ?2.  Minuscule right anterior inferior cerebral artery with retrograde reconstitution from pial ?collaterals from the right superior cerebellar artery. ? ?Arlean Hopping MD ?

## 2021-11-01 NOTE — Progress Notes (Signed)
Terance Ice to be D/C'd Home per MD order.  Discussed with the patient and all questions fully answered. ? ?VSS, Skin clean, dry and intact without evidence of skin break down, no evidence of skin tears noted. ?IV catheter discontinued intact. Site without signs and symptoms of complications. Dressing and pressure applied. ? ?An After Visit Summary was printed and given to the patient. Patient received prescription. ? ?D/c education completed with patient/family including follow up instructions, medication list, d/c activities limitations if indicated, with other d/c instructions as indicated by MD - patient able to verbalize understanding, all questions fully answered.  ? ?Patient instructed to return to ED, call 911, or call MD for any changes in condition.  ? ?Patient escorted via [place], and D/C home via private auto. ? ?[place] ?11/01/2021 4:50 PM  ?

## 2021-11-01 NOTE — Progress Notes (Addendum)
?      ?                 PROGRESS NOTE ? ?      ?PATIENT DETAILS ?Name: Christopher Pugh ?Age: 51 y.o. ?Sex: male ?Date of Birth: Apr 17, 1971 ?Admit Date: 10/30/2021 ?Admitting Physician Jonetta Osgood, MD ?CNO:[id number], Rebeca Alert, MD ? ?Brief Summary: ?51 y.o. 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  See below for further details ? ?Significant events: ?4/28>> sudden onset of vertigo/diplopia-MRI showed new acute right cerebellar infarct ? ?Significant studies: ?4/28>> CTA head/neck: No LVO, previously occluded intradural vertebral artery/basilar artery are patent-right V3 vertebral artery is severely narrowed and irregular in the region of the displaced right C1 lateral mass. ?4/28>>EEG:No seizure ?4/29>> MRI brain: Solitary small new right cerebellar infarct since 4/19. ?4/29>>B/L lower ext doppler:No DVT ? ?Significant microbiology data: ?4/28>> influenza/COVID PCR: Negative ? 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 ? ?Consults: ?Neurology, neurosurgery ? ?Subjective: ?No chest pain-shortness of breath.  No vertigo or diplopia. ? ?Objective: ?Vitals: ?Blood pressure 116/80, pulse (!) 58, temperature 98.3 ?F (36.8 ?C), temperature source Oral, resp. rate 17, SpO2 96 %.  ? ?Exam: ?Gen Exam:Alert awake-not in any distress ?HEENT:atraumatic, normocephalic ?Chest: B/L clear to auscultation anteriorly ?CVS:S1S2 regular ?Abdomen:soft non tender, non distended ?Extremities:no edema ?Neurology: Non focal ?Skin:  no rash  ? ?Pertinent Labs/Radiology: ? ?  Latest Ref Rng & Units 10/30/2021  ?  3:50 PM 10/30/2021  ?  2:34 PM 10/30/2021  ?  2:18 PM  ?CBC  ?WBC 4.0 - 10.5 K/uL 6.2    6.7    ?Hemoglobin 13.0 - 17.0 g/dL 13.8   14.6   15.2    ?Hematocrit 39.0 - 52.0 % 38.2   43.0   41.2    ?Platelets 150 - 400 K/uL 218    241    ?  ?Lab Results  ?Component Value Date  ? NA 138 10/30/2021  ? K 4.0 10/30/2021  ? CL 104 10/30/2021  ? CO2 23 10/30/2021  ? ?  ? ? ?Assessment/Plan: ?Acute right cerebellar infarct: No further diplopia/vertigo-felt to have extrinsic compression of his right vertebral artery from C1 fracture fragments.  Per neurosurgery-not a situation where this can be decompressed surgically, plans are for possible stent placement by neuro interventional surgery-patient underwent a vertebral artery angiogram today.  Will await further recommendations from neurology and interventional neurology.  Continue dual antiplatelet agents and statin. ? ?HLD: Continue statin ? 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 ?BMI: ?Estimated body mass index is 28.89 kg/m? as calculated from the following: ?  Height as of 10/19/21: '5\' 8"'$  (1.727 m). ?  Weight as of 10/19/21: 86.2 kg.  ? ?Code status: ?  Code Status: Full Code  ? ?DVT Prophylaxis: ?enoxaparin (LOVENOX) injection 40 mg Start: 10/30/21 1700 ?  ?Family Communication: Spouse at bedside ? ?Disposition Plan: ?Status is: Observation ?The patient will require care spanning > 2 midnights and should be moved to inpatient because: Recurrent acute CVA-s/p vertebral artery angiogram today-awaiting further recommendations from neurology/interventional neurology regarding timing of possible stenting to right vertebral artery ? ?  Planned Discharge Destination:Home ? ? ?Diet: ?Diet Order   ? ?       ?  Diet Heart Room service appropriate? Yes; Fluid consistency: Thin  Diet effective now       ?  ? ?  ?  ? ?  ?  ? ? ?Antimicrobial agents: ?Anti-infectives (From  admission, onward)  ? ? None  ? ?  ? ? ? ?MEDICATIONS: ?Scheduled Meds: ? aspirin EC  81 mg Oral Daily  ? clopidogrel  75 mg Oral Daily  ? enoxaparin (LOVENOX) injection  40 mg Subcutaneous QHS  ? fentaNYL      ? heparin lock flush      ? heparin lock flush      ? lidocaine      ? midazolam      ? multivitamin with minerals  1 tablet Oral Daily  ? rosuvastatin  40 mg Oral Daily  ? ?Continuous Infusions: ? sodium chloride 75 mL/hr at 11/01/21 1055  ? ?PRN Meds:.acetaminophen **OR** acetaminophen (TYLENOL) oral liquid 160 mg/5 mL **OR** acetaminophen, senna-docusate ? ? ?I have personally reviewed following labs and imaging studies ? ?LABORATORY DATA: ?CBC: ?Recent Labs  ?Lab 10/30/21 ?1418 10/30/21 ?1434 10/30/21 ?1550  ?WBC 6.7  --  6.2  ?NEUTROABS 4.9  --   --   ?HGB 15.2 14.6 13.8  ?HCT 41.2 43.0 38.2*  ?MCV 89.6  --  90.7  ?PLT 241  --  218  ? ? ? ?Basic Metabolic Panel: ?Recent Labs  ?Lab 10/30/21 ?1418 10/30/21 ?1434 10/30/21 ?1550  ?NA 136 138  --   ?K 3.9 4.0  --   ?CL 105 104  --   ?CO2 23  --   --   ?GLUCOSE 105* 105*  --   ?BUN 25* 27*  --   ?CREATININE 1.25* 1.30* 1.30*  ?CALCIUM 9.9  --   --   ? ? ? ?GFR: ?Estimated Creatinine Clearance: 71.8 mL/min (A) (by C-G formula based on SCr of 1.3 mg/dL (H)). ? ?Liver Function Tests: ?Recent Labs  ?Lab 10/30/21 ?1418  ?AST 33  ?ALT 36  ?ALKPHOS 43  ?BILITOT 0.9  ?PROT 7.2  ?ALBUMIN 4.6  ? ? ?No results for input(s): LIPASE, AMYLASE in the last 168 hours. ?No results for input(s): AMMONIA in the last 168 hours. ? ?Coagulation Profile: ?Recent Labs  ?Lab 10/30/21 ?1418  ?INR 1.0  ? ? ? ?Cardiac Enzymes: ?No results for input(s): CKTOTAL, CKMB, CKMBINDEX, TROPONINI in the last 168 hours. ? ?BNP (last 3 results) ?No results for input(s): PROBNP in the last 8760 hours. ? ?Lipid Profile: ?No results for input(s): CHOL, HDL, LDLCALC, TRIG, CHOLHDL, LDLDIRECT in the last 72 hours. ? ?Thyroid Function Tests: ?No results for input(s): TSH, T4TOTAL, FREET4, T3FREE,  THYROIDAB in the last 72 hours. ? ?Anemia Panel: ?No results for input(s): VITAMINB12, FOLATE, FERRITIN, TIBC, IRON, RETICCTPCT in the last 72 hours. ? ?Urine analysis: ?   ?Component Value Date/Time  ? COLORURINE STRAW (A) 10/30/2021 1418  ? APPEARANCEUR CLEAR 10/30/2021 1418  ? LABSPEC 1.027 10/30/2021 1418  ? PHURINE 5.0 10/30/2021 1418  ? GLUCOSEU NEGATIVE 10/30/2021 1418  ? [place] NEGATIVE 10/30/2021 1418  ? [place] NEGATIVE 10/30/2021 1418  ? [place] NEGATIVE 10/30/2021 1418  ? PROTEINUR NEGATIVE 10/30/2021 1418  ? NITRITE NEGATIVE 10/30/2021 1418  ? LEUKOCYTESUR NEGATIVE 10/30/2021 1418  ? ? ?Sepsis Labs: ?Lactic Acid, Venous ?No results found for: LATICACIDVEN ? ?MICROBIOLOGY: ?Recent Results (from the past 240 hour(s))  ?Resp Panel by  RT-PCR (Flu A&B, Covid) Nasopharyngeal Swab     Status: None  ? Collection Time: 10/30/21  4:00 PM  ? Specimen: Nasopharyngeal Swab; Nasopharyngeal(NP) swabs in vial transport medium  ?Result Value Ref Range Status  ? SARS Coronavirus 2 by RT PCR NEGATIVE NEGATIVE Final  ?  Comment: (NOTE) ?SARS-CoV-2 target nucleic acids are NOT DETECTED. ? ?The SARS-CoV-2 RNA is generally detectable in upper respiratory ?specimens during the acute phase of infection. The lowest ?concentration of SARS-CoV-2 viral copies this assay can detect is ?138 copies/mL. A negative result does not preclude SARS-Cov-2 ?infection and should not be used as the sole basis for treatment or ?other patient management decisions. A negative result may occur with  ?improper specimen collection/handling, submission of specimen other ?than nasopharyngeal swab, presence of viral mutation(s) within the ?areas targeted by this assay, and inadequate number of viral ?copies(<138 copies/mL). A negative result must be combined with ?clinical observations, patient history, and epidemiological ?information. The expected result is Negative. ? ?Fact Sheet for Patients:  ?EntrepreneurPulse.com.au ? ?Fact  Sheet for Healthcare Providers:  ?IncredibleEmployment.be ? ?This test is no t yet approved or cleared by the [place] FDA and  ?has been authorized for detection and/or diagnosis of SARS-CoV-2

## 2021-11-01 NOTE — Discharge Summary (Signed)
? ?PATIENT DETAILS ?Name: Christopher Pugh ?Age: 51 y.o. ?Sex: male ?Date of Birth: [birth year] ?MRN: [id number]. ?Admitting Physician: Jonetta Osgood, MD ?OFB:PZWCHE, Rebeca Alert, MD ? ?Admit Date: 10/30/2021 ?Discharge date: 11/01/2021 ? ?Recommendations for Outpatient Follow-up:  ?Follow up with PCP in 1-2 weeks ?Please obtain CMP/CBC in one week ?Please ensure follow up with neurosurgery ? ?Admitted From:  ?Home ? ?Disposition: ?Home ?  ?Discharge Condition: ?good ? ?CODE STATUS: ?  Code Status: Full Code  ? ?Diet recommendation:  ?Diet Order   ? ?       ?  Diet Heart Room service appropriate? Yes; Fluid consistency: Thin  Diet effective now       ?  ?  Diet - low sodium heart healthy       ?  ? ?  ?  ? ?  ?  ? ?Brief Summary: ?51 y.o. 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  See below for further details ?  ?Significant events: ?4/28>> sudden onset of vertigo/diplopia-MRI showed new acute right cerebellar infarct ?  ?Significant studies: ?4/28>> CTA head/neck: No LVO, previously occluded intradural vertebral artery/basilar artery are patent-right V3 vertebral artery is severely narrowed and irregular in the region of the displaced right C1 lateral mass. ?4/28>>EEG:No seizure ?4/29>> MRI brain: Solitary small new right cerebellar infarct since 4/19. ?4/29>>B/L lower ext doppler:No DVT ?  ?Significant microbiology data: ?4/28>> influenza/COVID PCR: Negative ?  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 ?   ?Consults: ?Neurology, neurosurgery ? ?Brief Hospital Course: ?Acute right cerebellar infarct: No further diplopia/vertigo-felt to have extrinsic compression of his right vertebral artery from C1 fracture fragments as the etiology of his stroke.  Per neurosurgery-not a situation where this can be decompressed surgically, subsequently evaluated by IR-s/p vertebral artery angiogram today-spoke with Neurology Dr Chucky May to d/c home today-patient will be called by IR-and will have stenting of this right vertebral artery done either on Tuesday or Wednesday.    Continue dual antiplatelet agents and statin.Patient aware that he has to use a soft cervical collar until seen by IR (He has one at home) ?  ?HLD: Continue statin ?  ?History of C1 burst fracture with chronic daily persistent headache ? ?Obesity: ?Estimated body mass index is 28.89 kg/m? as calculated from the following: ?  Height as of 10/19/21: '5\' 8"'$  (1.727 m). ?  Weight as of 10/19/21: 86.2 kg.  ? ?Discharge Diagnoses:  ?Principal Problem: ?  TIA (transient ischemic attack) ?Active Problems: ?  Hypogonadism in male ?  C1 cervical fracture ([place]) ?  HLD (hyperlipidemia) ? ? ?Discharge Instructions: ? ?Activity:  ?As tolerated  ? ?Discharge Instructions   ? ? Diet - low sodium heart healthy   Complete by: As directed ?  ? Discharge instructions   Complete by: As directed ?  ? Follow with Primary MD  Chesley Noon, MD in 1-2 weeks ? ?You will get a call from radiology office for instruction regarding vertebral artery stenting ? ?Please get a complete blood count and chemistry panel checked by your Primary MD at your next visit, and again as instructed by your Primary MD. ? ?  Get Medicines reviewed and adjusted: ?Please take all your medications with you for your next visit with your Primary MD ? ?Laboratory/radiological data: ?Please request your Primary MD to go over all hospital tests and procedure/radiological results at the follow up, please ask your Primary MD  to get all Hospital records sent to his/her office. ? ?In some cases, they will be blood work, cultures and biopsy results pending at the time of your discharge. Please request that your primary care M.D. follows up on these results. ? ?Also Note the following: ?If you experience worsening of your admission symptoms, develop shortness of breath, life threatening emergency, suicidal or homicidal thoughts you must seek medical attention immediately by calling 911 or calling your MD immediately  if symptoms less severe. ? ?You must read complete instructions/literature along with all the possible adverse reactions/side effects for all the Medicines you take and that have been prescribed to you. Take any new Medicines after you have completely understood and accpet all the possible adverse reactions/side effects.  ? ?Do not drive when taking Pain medications or sleeping medications (Benzodaizepines) ? ?Do not take more than prescribed Pain, Sleep and Anxiety Medications. It is not advisable to combine anxiety,sleep and pain medications without talking with your primary care practitioner ? ?Special Instructions: If you have smoked or chewed Tobacco  in the last 2 yrs please stop smoking, stop any regular Alcohol  and or any Recreational drug use. ? ?Wear Seat belts while driving. ? ?Please note: ?You were cared for by a hospitalist during your hospital stay. Once you are discharged, your primary care physician will handle any further medical issues. Please note that NO REFILLS for any discharge medications will be authorized once you are discharged, as it is imperative that you return to your primary care physician (or establish a relationship with a primary care physician if you do not have one) for your post hospital discharge needs so that they can reassess your need for medications and monitor your lab values.  ? Increase activity slowly   Complete by: As directed ?  ? No wound care   Complete by: As directed ?  ? ?   ? ?Allergies as of 11/01/2021   ?No Known Allergies ?  ? ?  ?Medication List  ?  ? ?TAKE these medications   ? ?Aspirin Low Dose 81 MG EC tablet ?Generic drug: aspirin ?Take 1 tablet (81 mg total) by mouth daily. Swallow whole. ?  ?clopidogrel 75 MG tablet ?Commonly known as: PLAVIX ?Take 1 tablet (75 mg total) by mouth daily. ?  ?D3 PO ?Take 1 capsule by mouth daily. ?  ?ibuprofen 200 MG tablet ?Commonly known as: ADVIL ?Take 600 mg by mouth every 6 (six) hours as needed for headache or mild pain. ?  ?multivitamin tablet ?Take 1 tablet by mouth daily. ?  ?rosuvastatin 20 MG tablet ?Commonly known as: CRESTOR ?Take 2 tablets (40 mg total) by mouth daily. ?What changed: how much to take ?  ?tadalafil 5 MG tablet ?Commonly known as: CIALIS ?Take 5 mg by mouth daily. ?  ? ?  ? ? Follow-up Information   ? ? Camnitz, Ocie Doyne, MD Follow up.   ?Specialty: Cardiology ?Why: 11/10/2020 @ 12:20PM, to discuss cardiac work up/heart rhythm monitoring recommedned by neurology team. ?Contact information: ?[address] ?[address] ?[place] [address] ?[phone] ? ? ?  ?  ? ?  ?  ? ?  ? ?No Known Allergies ? ? ?Other Procedures/Studies: ?CT  HEAD WO CONTRAST ? ?Result Date: 10/19/2021 ?CLINICAL DATA:  Dizziness, aphasia. EXAM: CT HEAD WITHOUT CONTRAST TECHNIQUE: Contiguous axial images were obtained from the base of the skull through the vertex without intravenous contrast. RADIATION DOSE REDUCTION: This exam was performed according to the departmental dose-optimization program which includes automated exposure control, adjustment of the mA and/or kV according to patient size and/or use of iterative reconstruction technique. COMPARISON:  Outside head CT and cervical spine CT dated 04/01/2020. FINDINGS: Brain: Patchy low-density areas within the cerebellum, largest within the LEFT cerebellum measuring 2.2 cm, suggesting subacute to chronic infarcts. No mass, hemorrhage, edema or other evidence of acute parenchymal abnormality is  seen within the supratentorial brain. No midline shift or evidence of herniation. Vascular: No hyperdense vessel or unexpected calcification. Skull: Normal. Negative for fracture or focal lesion. Sinuses/

## 2021-11-01 NOTE — Progress Notes (Signed)
Pt noted to have scant amount of blood on dressing. Rn notified Dr. Estanislado Pandy, pt instructed to stay laying flat and RN applied pressure to site for 15 min. Drainage marked. Rn will reassess site in 15 min. ?

## 2021-11-01 NOTE — Progress Notes (Signed)
STROKE TEAM PROGRESS NOTE  ? ?SUBJECTIVE (INTERVAL HISTORY) ?His wife is at the bedside. Pt doing well, neuro stable, no change. Had cerebral angiogram today with Dr. Estanislado Pandy, showed severe tapered stenosis of horizontal segment of the right VA associated with the internal flap, and small right AICA with retrograde reconstitution from collaterals from the right SCA.  Discussed with patient and wife, they would like to proceed with vertebral artery stenting as outpatient.  Tentatively next Tuesday after discussed with Dr. Estanislado Pandy. ? ? ?OBJECTIVE ?Temp:  [97.9 ?F (36.6 ?C)-98.9 ?F (37.2 ?C)] 98 ?F (36.7 ?C) (04/30 1400) ?Pulse Rate:  [49-73] 72 (04/30 1400) ?Cardiac Rhythm: Normal sinus rhythm (04/30 0935) ?Resp:  [10-20] 11 (04/30 1400) ?BP: (93-135)/(66-108) 122/87 (04/30 1400) ?SpO2:  [96 %-100 %] 96 % (04/30 0956) ? ?Recent Labs  ?Lab 10/30/21 ?1425  ?GLUCAP 104*  ? ?Recent Labs  ?Lab 10/30/21 ?1418 10/30/21 ?1434 10/30/21 ?1550  ?NA 136 138  --   ?K 3.9 4.0  --   ?CL 105 104  --   ?CO2 23  --   --   ?GLUCOSE 105* 105*  --   ?BUN 25* 27*  --   ?CREATININE 1.25* 1.30* 1.30*  ?CALCIUM 9.9  --   --   ? ?Recent Labs  ?Lab 10/30/21 ?1418  ?AST 33  ?ALT 36  ?ALKPHOS 43  ?BILITOT 0.9  ?PROT 7.2  ?ALBUMIN 4.6  ? ?Recent Labs  ?Lab 10/30/21 ?1418 10/30/21 ?1434 10/30/21 ?1550  ?WBC 6.7  --  6.2  ?NEUTROABS 4.9  --   --   ?HGB 15.2 14.6 13.8  ?HCT 41.2 43.0 38.2*  ?MCV 89.6  --  90.7  ?PLT 241  --  218  ? ?No results for input(s): CKTOTAL, CKMB, CKMBINDEX, TROPONINI in the last 168 hours. ?Recent Labs  ?  10/30/21 ?1418  ?LABPROT 12.7  ?INR 1.0  ? ?Recent Labs  ?  10/30/21 ?1418  ?COLORURINE STRAW*  ?LABSPEC 1.027  ?PHURINE 5.0  ?GLUCOSEU NEGATIVE  ?HGBUR NEGATIVE  ?BILIRUBINUR NEGATIVE  ?KETONESUR NEGATIVE  ?PROTEINUR NEGATIVE  ?NITRITE NEGATIVE  ?LEUKOCYTESUR NEGATIVE  ?  ?   ?Component Value Date/Time  ? CHOL 193 10/19/2021 1120  ? TRIG 34 10/19/2021 1120  ? HDL 54 10/19/2021 1120  ? CHOLHDL 3.6 10/19/2021 1120  ?  VLDL 7 10/19/2021 1120  ? LDLCALC 132 (H) 10/19/2021 1120  ? ?Lab Results  ?Component Value Date  ? HGBA1C 5.3 10/19/2021  ? ?   ?Component Value Date/Time  ? [place] DETECTED 10/30/2021 1418  ? [place] DETECTED 10/30/2021 1418  ? [place] DETECTED 10/30/2021 1418  ? AMPHETMU NONE DETECTED 10/30/2021 1418  ? [place] DETECTED 10/30/2021 1418  ? LABBARB NONE DETECTED 10/30/2021 1418  ?  ?Recent Labs  ?Lab 10/30/21 ?1421  ?ETH <10  ? ? ?I have personally reviewed the radiological images below and agree with the radiology interpretations. ? ?CT HEAD WO CONTRAST ? ?Result Date: 10/19/2021 ?CLINICAL DATA:  Dizziness, aphasia. EXAM: CT HEAD WITHOUT CONTRAST TECHNIQUE: Contiguous axial images were obtained from the base of the skull through the vertex without intravenous contrast. RADIATION DOSE REDUCTION: This exam was performed according to the departmental dose-optimization program which includes automated exposure control, adjustment of the mA and/or kV according to patient size and/or use of iterative reconstruction technique. COMPARISON:  Outside head CT and cervical spine CT dated 04/01/2020. FINDINGS: Brain: Patchy low-density areas within the cerebellum, largest within the LEFT cerebellum measuring 2.2 cm, suggesting subacute  to chronic infarcts. No mass, hemorrhage, edema or other evidence of acute parenchymal abnormality is seen within the supratentorial brain. No midline shift or evidence of herniation. Vascular: No hyperdense vessel or unexpected calcification. Skull: Normal. Negative for fracture or focal lesion. Sinuses/Orbits: Fluid level within the RIGHT maxillary sinus. Remainder of the visualized paranasal sinuses are clear. Periorbital and retro-orbital soft tissues are unremarkable. Other: Displaced/comminuted fractures of the C1 vertebral body are incompletely imaged but are similar to the visualized portion of the upper cervical spine as seen on outside head CT of 04/01/2020, and  presumably chronic. IMPRESSION: 1. Patchy low-density areas within the cerebellum, largest within the LEFT cerebellum measuring 2.2 cm, most likely subacute to chronic infarcts, possibly sequela of a previous trauma, underlying neoplastic process not excluded. Consider brain MRI with contrast for further characterization. 2. No intracranial hemorrhage. 3. Displaced/comminuted fractures of the C1 vertebral body, incompletely imaged but visualized portion similar to the fractures seen on cervical spine CT of 04/01/2020. Recommend cervical spine CT to compared directly with the earlier outside CT and consider surgical consultation to evaluate for instability and/or possible need for surgical fixation. 4. Fluid level within the RIGHT maxillary sinus, suggesting acute sinusitis. Critical Value/emergent results were called by telephone at the time of interpretation on 10/19/2021 at 8:20 am to provider [facility] , who verbally acknowledged these results. Electronically Signed   By: Franki Cabot M.D.   On: 10/19/2021 08:22  ? ?MR ANGIO HEAD WO CONTRAST ? ?Result Date: 10/21/2021 ?CLINICAL DATA:  Stroke, follow up; post mechanical thrombectomy for basilar artery occlusion EXAM: MRI HEAD WITHOUT CONTRAST MRA HEAD WITHOUT CONTRAST TECHNIQUE: Multiplanar, multi-echo pulse sequences of the brain and surrounding structures were acquired without intravenous contrast. Angiographic images of the Circle of Willis were acquired using MRA technique without intravenous contrast. COMPARISON:  10/19/2021 FINDINGS: MRI HEAD DWI and sagittal T1 sequences obtained. Acute bilateral cerebellar infarcts are again identified. Extent of involvement is mildly greater. No significant mass effect. No hydrocephalus. MRA HEAD Intracranial internal carotid arteries are patent. Middle and anterior cerebral arteries are patent. Intracranial vertebral arteries are patent. Basilar artery is patent. Patent left PICA and AICA origins identified. Patent  bilateral SCA origins. Bilateral posterior communicating arteries are present. Patent bilateral posterior cerebral arteries with flow primarily from the posterior communicating arteries. IMPRESSION: Acute bilateral cerebellar infarcts with mild increase in extent of involvement. No significant mass effect. Patent vertebral and basilar arteries. Electronically Signed   By: Macy Mis M.D.   On: 10/21/2021 11:26  ? ?MR BRAIN WO CONTRAST ? ?Result Date: 10/31/2021 ?CLINICAL DATA:  51 year old male neurologic deficit. Bilateral distal vertebral artery and proximal basilar thrombosis with bilateral cerebellar infarcts earlier this month. Code stroke presentation yesterday. EXAM: MRI HEAD WITHOUT CONTRAST TECHNIQUE: Multiplanar, multiecho pulse sequences of the brain and surrounding structures were obtained without intravenous contrast. COMPARISON:  CT head and CTA head and neck yesterday. Brain MRI and MRA 10/21/2021 and earlier. FINDINGS: Brain: Evolution of patchy and confluent bilateral sore a bell ir infarcts since 10/19/2021. Solitary small new cerebellar infarct on the right series 2, image 13, 9-10 mm. No brainstem restricted diffusion now. Cerebellar T2 and FLAIR hyperintensity as expected. Only punctate petechial hemorrhage in the right cerebellar hemisphere series 7, image 21 which is probably nonacute. No supratentorial restricted diffusion to suggest acute infarction. No midline shift, mass effect, evidence of mass lesion, ventriculomegaly, extra-axial collection or acute intracranial hemorrhage. Cervicomedullary junction and pituitary are within normal limits. Minor supratentorial white matter  T2 and FLAIR hyperintensity appears stable. No cerebral cortical encephalomalacia or supratentorial chronic blood products identified. Vascular: Major intracranial vascular flow voids are improved compared to 10/19/2021. Preserved basilar artery flow void. Skull and upper cervical spine: Stable and negative visible  cervical spine, bone marrow signal. Sinuses/Orbits: Stable, negative. Other: Mastoids remain clear. Visible internal auditory structures appear normal. Negative visible scalp and face. IMPRESSION: 1. Solit

## 2021-11-01 NOTE — TOC Transition Note (Signed)
Transition of Care (TOC) - CM/SW Discharge Note ? ? ?Patient Details  ?Name: Christopher Pugh ?MRN: [id number] ?Date of Birth: [birth year] ? ?Transition of Care (TOC) CM/SW Contact:  ?Carles Collet, RN ?Phone Number: ?11/01/2021, 2:50 PM ? ? ?Clinical Narrative:   Patient to continue outpatient therapies at [facility]. No further action required. No other TOC needs identified.  ? ? ? ?  ?  ? ? ?Patient Goals and CMS Choice ?  ?  ?  ? ?Discharge Placement ?  ?           ?  ?  ?  ?  ? ?Discharge Plan and Services ?  ?  ?           ?  ?  ?  ?  ?  ?  ?  ?  ?  ?  ? ?Social Determinants of Health (SDOH) Interventions ?  ? ? ?Readmission Risk Interventions ?   ? View : No data to display.  ?  ?  ?  ? ? ? ? ? ?

## 2021-11-02 ENCOUNTER — Other Ambulatory Visit (HOSPITAL_COMMUNITY): Payer: Self-pay | Admitting: Interventional Radiology

## 2021-11-02 ENCOUNTER — Other Ambulatory Visit (HOSPITAL_COMMUNITY): Payer: Self-pay

## 2021-11-02 ENCOUNTER — Ambulatory Visit: Payer: BC Managed Care – PPO | Admitting: Physical Therapy

## 2021-11-02 DIAGNOSIS — I771 Stricture of artery: Secondary | ICD-10-CM

## 2021-11-02 NOTE — Progress Notes (Deleted)
PCP:  Christopher Noon, MD Primary Cardiologist: None Electrophysiologist: Larwance Rote Mathes is a 51 y.o. male seen today in consult with multiple recent strokes and for TEE/Loop consideration.      None for routine electrophysiology followup.  Since last being seen in our clinic the patient reports doing ***.  he denies chest pain, palpitations, dyspnea, PND, orthopnea, nausea, vomiting, dizziness, syncope, edema, weight gain, or early satiety.  Past Medical History:  Diagnosis Date   BPH without obstruction/lower urinary tract symptoms    CVA (cerebral vascular accident) ([place]) 10/19/2021   Hypogonadism in male    Medical history non-contributory    Past Surgical History:  Procedure Laterality Date   ELBOW SURGERY Right 06/2021   IR CT HEAD LTD  10/19/2021   IR PERCUTANEOUS ART THROMBECTOMY/INFUSION INTRACRANIAL INC DIAG ANGIO  10/19/2021   IR US GUIDE VASC ACCESS RIGHT  10/19/2021   RADIOLOGY WITH ANESTHESIA N/A 10/19/2021   Procedure: IR WITH ANESTHESIA;  Surgeon: Radiologist, Medication, MD;  Location: [place];  Service: Radiology;  Laterality: N/A;    Current Outpatient Medications  Medication Sig Dispense Refill   aspirin 81 MG EC tablet Take 1 tablet (81 mg total) by mouth daily. Swallow whole. 30 tablet 11   Cholecalciferol (D3 PO) Take 1 capsule by mouth daily.     clopidogrel (PLAVIX) 75 MG tablet Take 1 tablet (75 mg total) by mouth daily. 21 tablet 0   ibuprofen (ADVIL) 200 MG tablet Take 600 mg by mouth every 6 (six) hours as needed for headache or mild pain.     Multiple Vitamin (MULTIVITAMIN) tablet Take 1 tablet by mouth daily.     rosuvastatin (CRESTOR) 20 MG tablet Take 2 tablets (40 mg total) by mouth daily. 90 tablet 3   tadalafil (CIALIS) 5 MG tablet Take 5 mg by mouth daily.     No current facility-administered medications for this visit.    No Known Allergies  Social History   Socioeconomic History   Marital status: Married    Spouse name: Not  on file   Number of children: Not on file   Years of education: Not on file   Highest education level: Not on file  Occupational History   Occupation: Teacher, early years/pre    Comment: [employer]  Tobacco Use   Smoking status: Never   Smokeless tobacco: Former  Scientific laboratory technician Use: Never used  Substance and Sexual Activity   Alcohol use: Yes    Comment: binge drinks on weekends   Drug use: Never   Sexual activity: Not on file  Other Topics Concern   Not on file  Social History Narrative   Not on file   Social Determinants of Health   Financial Resource Strain: Not on file  Food Insecurity: Not on file  Transportation Needs: Not on file  Physical Activity: Not on file  Stress: Not on file  Social Connections: Not on file  Intimate Partner Violence: Not on file     Review of Systems: All other systems reviewed and are otherwise negative except as noted above.  Physical Exam: There were no vitals filed for this visit.  GEN- The patient is well appearing, alert and oriented x 3 today.   HEENT: normocephalic, atraumatic; sclera clear, conjunctiva pink; hearing intact; oropharynx clear; neck supple, no JVP Lymph- no cervical lymphadenopathy Lungs- Clear to ausculation bilaterally, normal work of breathing.  No wheezes, rales, rhonchi Heart- Regular rate and rhythm, no  murmurs, rubs or gallops, PMI not laterally displaced GI- soft, non-tender, non-distended, bowel sounds present, no hepatosplenomegaly Extremities- no clubbing, cyanosis, or edema; DP/PT/radial pulses 2+ bilaterally MS- no significant deformity or atrophy Skin- warm and dry, no rash or lesion Psych- euthymic mood, full affect Neuro- strength and sensation are intact  EKG {ACTION; IS/IS ZOX:[id number]} ordered. Personal review of EKG from {Blank single:19197::"today","***"} shows ***  Additional studies reviewed include: Previous EP office notes. ***  Assessment and Plan:  1. ***  2.  ***  Follow up with {Blank single:19197::"Dr. Allred","Dr. Arlan Organ. Klein","Dr. Camnitz","Dr. Lambert","EP APP"} in {Blank single:19197::"2 weeks","4 weeks","3 months","6 months","12 months","as usual post gen change"}   Shirley Friar, PA-C  11/02/21 12:20 PM

## 2021-11-03 ENCOUNTER — Other Ambulatory Visit (HOSPITAL_COMMUNITY): Payer: Self-pay

## 2021-11-03 ENCOUNTER — Other Ambulatory Visit (HOSPITAL_COMMUNITY): Payer: Self-pay | Admitting: Neuroradiology

## 2021-11-03 ENCOUNTER — Telehealth (HOSPITAL_COMMUNITY): Payer: Self-pay

## 2021-11-03 ENCOUNTER — Other Ambulatory Visit: Payer: Self-pay

## 2021-11-03 ENCOUNTER — Other Ambulatory Visit: Payer: Self-pay | Admitting: Internal Medicine

## 2021-11-03 DIAGNOSIS — I771 Stricture of artery: Secondary | ICD-10-CM

## 2021-11-03 NOTE — H&P (Addendum)
? ?Chief Complaint: Patient was seen in consultation today for right vertebral artery stenosis. ? ?Referring Physician(s): Rosalin Hawking, MD ? ?Supervising Physician: Pedro Earls ? ?Patient Status: [facility] - Out-pt ? ?History of Present Illness: ?Christopher Pugh is a 51 y.o. male with a past medical history significant for BPH and CVA who presents today for image guided cerebral angiogram with possible intervention for right vertebral artery stenosis. Christopher Pugh presented to [facility] ED on 10/19/21 with complaints of dizziness, nausea, transient tingling of his right fingers and mildly slurred speech. CTA showed: ? ?1. Non-opacification of the distal right vertebral artery (V2/V3) ?and distal left intradural vertebral artery with non-opacification ?of the proximal basilar artery. Findings are concerning for ?occlusion or high-grade stenosis of these vessels and could relate ?to thrombosis and/or dissection. The more distal basilar artery and ?bilateral posterior communicating arteries are opacified, ?potentially related to retrograde flow from the anterior circulation ?given bilateral posterior communicating arteries and small ?vertebrobasilar system. ?2. Remote C1 burst fracture with fragmentation and degenerative ?change at the craniocervical junction. Lateral displacement of the ?lateral mass relative to C1 appears similar to the outside prior ?from 04/01/2020. Please see that study for further characterization. ? ?He was emergently taken to [facility] and underwent successful mechanical thrombectomy for treatment of an occlusion within the proximal basilar artery with Dr Tennis Must Sindy Messing. He was subsequently discharged on 10/21/21 on DAPT.  ? ?Christopher Pugh returned to the ED on 10/30/21 with complaints of unsteady gait and double vision. CTA head/neck showed: ? ?1. Previously occluded distal intradural vertebral arteries and ?basilar artery are patent, but small. ?2. Suspected severe stenosis versus occlusion  of a small left P1 PCA ?with prominent bilateral posterior communicating arteries and small ?vertebrobasilar system. ?3. The right V3 vertebral artery is severely narrowed and irregular ?in the region of displaced right C1 lateral mass. Traumatic arterial ?injury/dissection (possibly chronic given prior occlusion) is ?possible. ?4. Redemonstrated remote C1 burst fracture with fragmentation and ?lateral displacement of the C1 lateral masses and mildly widened ?atlantodental interval. Given the malalignment and impression #3, ?this fracture warrants close surgical follow-up to if the patient ?has not been recently seen/evaluated.  ? ?He was admitted to the neurology service for further evaluation and NIR was asked to perform a diagnostic cerebral angiogram which was done on 11/01/21 with Dr. Estanislado Pandy, showing severe tapered stenosis of the horizontal segment of the right vertebral artery associated with an intimal flap. He was discharged later that day and presents today for cerebral angiogram with possible intervention on the right vertebral artery. ? ?Christopher Pugh seen in short stay with his wife, he reports ongoing dizziness which has not worsened since his initial stroke. He has not had any new stroke-like symptoms since discharge on 4/30. He understands the procedure today, including the use of general anesthesia and overnight admission, and is agreeable to proceed. ? ?Past Medical History:  ?Diagnosis Date  ? BPH without obstruction/lower urinary tract symptoms   ? CVA (cerebral vascular accident) ([place]) 10/19/2021  ? Hypogonadism in male   ? Medical history non-contributory   ? ? ?Past Surgical History:  ?Procedure Laterality Date  ? ELBOW SURGERY Right 06/2021  ? IR ANGIO VERTEBRAL SEL VERTEBRAL BILAT MOD SED  11/01/2021  ? IR CT HEAD LTD  10/19/2021  ? IR PERCUTANEOUS ART THROMBECTOMY/INFUSION INTRACRANIAL INC DIAG ANGIO  10/19/2021  ? IR US GUIDE VASC ACCESS RIGHT  10/19/2021  ? RADIOLOGY WITH ANESTHESIA N/A  10/19/2021  ? Procedure: IR WITH  ANESTHESIA;  Surgeon: Radiologist, Medication, MD;  Location: [place];  Service: Radiology;  Laterality: N/A;  ? ? ?Allergies: ?Patient has no known allergies. ? ?Medications: ?Prior to Admission medications   ?Medication Sig Start Date End Date Taking? Authorizing Provider  ?aspirin 81 MG EC tablet Take 1 tablet (81 mg total) by mouth daily. Swallow whole. 10/22/21   Rosalin Hawking, MD  ?Cholecalciferol (D3 PO) Take 1 capsule by mouth daily.    [provider]  ?clopidogrel (PLAVIX) 75 MG tablet Take 1 tablet (75 mg total) by mouth daily. 10/22/21   Rosalin Hawking, MD  ?ibuprofen (ADVIL) 200 MG tablet Take 600 mg by mouth every 6 (six) hours as needed for headache or mild pain.    [provider]  ?Multiple Vitamin (MULTIVITAMIN) tablet Take 1 tablet by mouth daily.    [provider]  ?rosuvastatin (CRESTOR) 20 MG tablet Take 2 tablets (40 mg total) by mouth daily. 11/01/21   Ghimire, Henreitta Leber, MD  ?tadalafil (CIALIS) 5 MG tablet Take 5 mg by mouth daily. 11/13/19   [provider]  ?  ? ?Family History  ?Problem Relation Age of Onset  ? Ovarian cancer Mother   ? Hypertension Father   ? Stroke Paternal Aunt   ? ? ?Social History  ? ?Socioeconomic History  ? Marital status: Married  ?  Spouse name: Not on file  ? Number of children: Not on file  ? Years of education: Not on file  ? Highest education level: Not on file  ?Occupational History  ? Occupation: Teacher, early years/pre  ?  Comment: [employer]  ?Tobacco Use  ? Smoking status: Never  ? Smokeless tobacco: Former  ?Vaping Use  ? Vaping Use: Never used  ?Substance and Sexual Activity  ? Alcohol use: Yes  ?  Comment: binge drinks on weekends  ? Drug use: Never  ? Sexual activity: Not on file  ?Other Topics Concern  ? Not on file  ?Social History Narrative  ? Not on file  ? ?Social Determinants of Health  ? ?Financial Resource Strain: Not on file  ?Food Insecurity: Not on file  ?Transportation  Needs: Not on file  ?Physical Activity: Not on file  ?Stress: Not on file  ?Social Connections: Not on file  ? ? ? ?Review of Systems: A 12 point ROS discussed and pertinent positives are indicated in the HPI above.  All other systems are negative. ? ?Review of Systems  ?Constitutional:  Negative for appetite change, chills and fever.  ?Respiratory:  Negative for cough and shortness of breath.   ?Cardiovascular:  Negative for chest pain.  ?Gastrointestinal:  Negative for abdominal pain, constipation, nausea and vomiting.  ?Musculoskeletal:  Negative for back pain.  ?Neurological:  Positive for dizziness (unchanged since initial stroke) and headaches (mild). Negative for tremors, seizures, syncope, facial asymmetry, speech difficulty, weakness, light-headedness and numbness.  ?Psychiatric/Behavioral:  Negative for confusion.   ? ?Vital Signs: ?There were no vitals taken for this visit. ? ?Physical Exam ?Vitals reviewed.  ?Constitutional:   ?   General: He is not in acute distress. ?   Appearance: He is not ill-appearing.  ?HENT:  ?   Head: Normocephalic.  ?   Mouth/Throat:  ?   Mouth: Mucous membranes are moist.  ?   Pharynx: Oropharynx is clear. No oropharyngeal exudate or posterior oropharyngeal erythema.  ?Cardiovascular:  ?   Rate and Rhythm: Normal rate and regular rhythm.  ?Pulmonary:  ?   Effort: Pulmonary effort  is normal.  ?   Breath sounds: Normal breath sounds.  ?Abdominal:  ?   General: There is no distension.  ?   Palpations: Abdomen is soft.  ?   Tenderness: There is no abdominal tenderness.  ?Skin: ?   General: Skin is warm and dry.  ?Neurological:  ?   Mental Status: He is alert and oriented to person, place, and time.  ?Psychiatric:     ?   Mood and Affect: Mood normal.     ?   Behavior: Behavior normal.     ?   Thought Content: Thought content normal.     ?   Judgment: Judgment normal.  ?Alert, awake, and oriented x4 ?Speech and comprehension in tact ?PERRL bilaterally ?EOMs without nystagmus or  subjective diplopia. ?Visual fields grossly in tact ?No facial asymmetry. ?Tongue midline ?Motor power full all 4 extremities ? ? ?Imaging: ?CT HEAD WO CONTRAST ? ?Result Date: 10/19/2021 ?CLINICAL DATA:  Sharma Covert

## 2021-11-03 NOTE — Progress Notes (Addendum)
Christopher Pugh denies chest pain or shortness of breath. Patient denies having any s/s of Covid in his household.  Patient denies any known exposure to Covid.  ? ?Christopher Pugh' PCP is Dr Anastasia Pall. ? ?I instructed  Christopher Pugh shower with antibacteria soap.   No nail polish, artificial or acrylic nails. Wear clean clothes, brush your teeth. ?Glasses, contact lens,dentures or partials may not be worn in the OR. If you need to wear them, please bring a case for glasses, do not wear contacts or bring a case, the hospital does not have contact cases, dentures or partials will have to be removed , make sure they are clean, we will provide a denture cup to put them in. You will need some one to drive you home and a responsible person over the age of 69 to stay with you for the first 24 hours after surgery.  ? ?Karoline Caldwell, PA-C reviewed chart. ?

## 2021-11-03 NOTE — Telephone Encounter (Signed)
Pharmacy Transitions of Care Follow-up Telephone Call ? ?Date of discharge: 11/01/2021  ?Discharge Diagnosis: Stroke ? ?How have you been since you were released from the hospital? Doing well  ? ?Medication changes made at discharge: ? - CHANGED: rosuvastatin ? ?Medication changes verified by the patient? Yes ?  ? ?Medication Accessibility: ? ?[facility] [address]   ? ?Was the patient provided with refills on discharged medications? Yes  ? ?Have all prescriptions been transferred from [facility] to home pharmacy? Yes  ? ?Is the patient able to afford medications? Yes ?Notable copays: $5/21 days supply ?  ? ?Medication Review: ? ?CLOPIDOGREL (PLAVIX) ?Clopidogrel 75 mg once daily.  ?- Educated patient on dual antiplatelet therapy with Plavix and baby aspirin. ?- Reviewed potential DDIs with patient  ?- Advised patient of medications to avoid (NSAIDs, ASA)  ?- Educated that Tylenol (acetaminophen) will be the preferred analgesic to prevent risk of bleeding  ?- Emphasized importance of monitoring for signs and symptoms of bleeding (abnormal bruising, prolonged bleeding, nose bleeds, bleeding from gums, discolored urine, black tarry stools)  ?- Advised patient to alert all providers of anticoagulation therapy prior to starting a new medication or having a procedure  ? ?Follow-up Appointments: ? ?PCP Hospital f/u appt confirmed?  Scheduled to see Antony Contras on 11/16/21 @ 11:30am.  ? ?Specialist Hospital f/u appt confirmed? Scheduled to see  Janann August  on 11/20/2021 @ 07:15am.  ? ?If their condition worsens, is the pt aware to call PCP or go to the Emergency Dept.? Yes ? ?Final Patient Assessment: ?-Pt is doing well.  ?-Pt verbalized understanding of Plavix.  ?-Pt has post discharge appointment and [facility]   ? ?Sinda Du, PharmD Candidate  ?[place], [place] D ? ?  ?

## 2021-11-03 NOTE — H&P (Deleted)
  The note originally documented on this encounter has been moved the the encounter in which it belongs.  

## 2021-11-04 ENCOUNTER — Ambulatory Visit (HOSPITAL_COMMUNITY): Payer: BC Managed Care – PPO | Admitting: Anesthesiology

## 2021-11-04 ENCOUNTER — Encounter (HOSPITAL_COMMUNITY): Payer: Self-pay | Admitting: Interventional Radiology

## 2021-11-04 ENCOUNTER — Other Ambulatory Visit: Payer: Self-pay

## 2021-11-04 ENCOUNTER — Encounter (HOSPITAL_COMMUNITY)
Admission: RE | Disposition: A | Discharge status: Home / Self Care | Payer: Self-pay | Source: Home / Self Care | Attending: Neuroradiology

## 2021-11-04 ENCOUNTER — Observation Stay (HOSPITAL_COMMUNITY)
Admission: RE | Admit: 2021-11-04 | Discharge: 2021-11-05 | Disposition: A | Discharge status: Home / Self Care | Payer: BC Managed Care – PPO | Attending: Neuroradiology | Admitting: Neuroradiology

## 2021-11-04 ENCOUNTER — Ambulatory Visit: Payer: BC Managed Care – PPO

## 2021-11-04 ENCOUNTER — Ambulatory Visit (HOSPITAL_COMMUNITY)
Admission: RE | Admit: 2021-11-04 | Discharge: 2021-11-04 | Disposition: A | Discharge status: Home / Self Care | Payer: BC Managed Care – PPO | Source: Ambulatory Visit | Attending: Interventional Radiology | Admitting: Interventional Radiology

## 2021-11-04 DIAGNOSIS — I7774 Dissection of vertebral artery: Secondary | ICD-10-CM

## 2021-11-04 DIAGNOSIS — I771 Stricture of artery: Secondary | ICD-10-CM

## 2021-11-04 DIAGNOSIS — Z8673 Personal history of transient ischemic attack (TIA), and cerebral infarction without residual deficits: Secondary | ICD-10-CM | POA: Insufficient documentation

## 2021-11-04 HISTORY — PX: IR US GUIDE VASC ACCESS RIGHT: IMG2390

## 2021-11-04 HISTORY — PX: IR INTRA CRAN STENT: IMG2345

## 2021-11-04 HISTORY — DX: Concussion with loss of consciousness status unknown, initial encounter: S06.0XAA

## 2021-11-04 HISTORY — PX: RADIOLOGY WITH ANESTHESIA: SHX6223

## 2021-11-04 HISTORY — PX: IR ANGIOGRAM EXTREMITY RIGHT: IMG652

## 2021-11-04 HISTORY — DX: Headache, unspecified: R51.9

## 2021-11-04 HISTORY — PX: IR ANGIO VERTEBRAL SEL VERTEBRAL UNI R MOD SED: IMG5368

## 2021-11-04 LAB — CBC WITH DIFFERENTIAL/PLATELET
Abs Immature Granulocytes: 0.01 10*3/uL (ref 0.00–0.07)
Basophils Absolute: 0 10*3/uL (ref 0.0–0.1)
Basophils Relative: 1 %
Eosinophils Absolute: 0.1 10*3/uL (ref 0.0–0.5)
Eosinophils Relative: 3 %
HCT: 36.4 % — ABNORMAL LOW (ref 39.0–52.0)
Hemoglobin: 12.6 g/dL — ABNORMAL LOW (ref 13.0–17.0)
Immature Granulocytes: 0 %
Lymphocytes Relative: 19 %
Lymphs Abs: 0.7 10*3/uL (ref 0.7–4.0)
MCH: 31.7 pg (ref 26.0–34.0)
MCHC: 34.6 g/dL (ref 30.0–36.0)
MCV: 91.7 fL (ref 80.0–100.0)
Monocytes Absolute: 0.4 10*3/uL (ref 0.1–1.0)
Monocytes Relative: 9 %
Neutro Abs: 2.6 10*3/uL (ref 1.7–7.7)
Neutrophils Relative %: 68 %
Platelets: 189 10*3/uL (ref 150–400)
RBC: 3.97 MIL/uL — ABNORMAL LOW (ref 4.22–5.81)
RDW: 11.7 % (ref 11.5–15.5)
WBC: 3.8 10*3/uL — ABNORMAL LOW (ref 4.0–10.5)
nRBC: 0 % (ref 0.0–0.2)

## 2021-11-04 LAB — BASIC METABOLIC PANEL
Anion gap: 8 (ref 5–15)
BUN: 24 mg/dL — ABNORMAL HIGH (ref 6–20)
CO2: 24 mmol/L (ref 22–32)
Calcium: 9.2 mg/dL (ref 8.9–10.3)
Chloride: 110 mmol/L (ref 98–111)
Creatinine, Ser: 1.14 mg/dL (ref 0.61–1.24)
GFR, Estimated: >60 mL/min (ref >60)
Glucose, Bld: 109 mg/dL — ABNORMAL HIGH (ref 70–99)
Potassium: 4.3 mmol/L (ref 3.5–5.1)
Sodium: 142 mmol/L (ref 135–145)

## 2021-11-04 LAB — POCT ACTIVATED CLOTTING TIME
Activated Clotting Time: 119 seconds
Activated Clotting Time: 167 seconds

## 2021-11-04 LAB — PROTIME-INR
INR: 1 (ref 0.8–1.2)
Prothrombin Time: 13 seconds (ref 11.4–15.2)

## 2021-11-04 LAB — HEPARIN LEVEL (UNFRACTIONATED): Heparin Unfractionated: <0.1 IU/mL — ABNORMAL LOW (ref 0.30–0.70)

## 2021-11-04 IMAGING — XA IR INTRACRANIAL STENT (INCL PTA)
10 of 13 series · 13 of 24 positions shown · IV contrast (IODINE)
Comparison: Cerebral angiogram [DATE].

INDICATION: 51-year-old male with recurrent posterior circulation stroke in the
setting of right vertebral artery dissection. He comes to our
service today for a diagnostic cerebral angiogram in the right
vertebral artery stenting. Patient is on dual anti-platelet therapy
with aspirin and Plavix.

EXAM:
ULTRASOUND-GUIDED VASCULAR [REDACTED] CEREBRAL ANGIOGRAM
RIGHT VERTEBRAL ARTERY STENTING
TECHNIQUE: Informed written consent was obtained from the patient after a
thorough discussion of the procedural risks, benefits and
alternatives. All questions were addressed.

[Series 1: ir (id) mc2 · 1 of 4 slices shown]
[im 1/4]
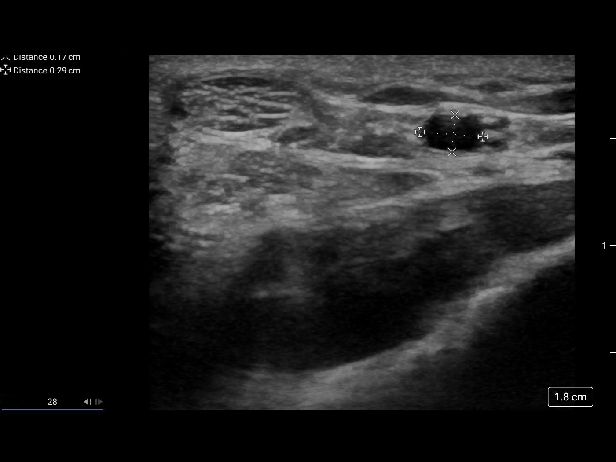

[Series 3: cerebral care 2 · 2 acquisitions, 1 frame shown (1 of 7)]
[im 1/2]
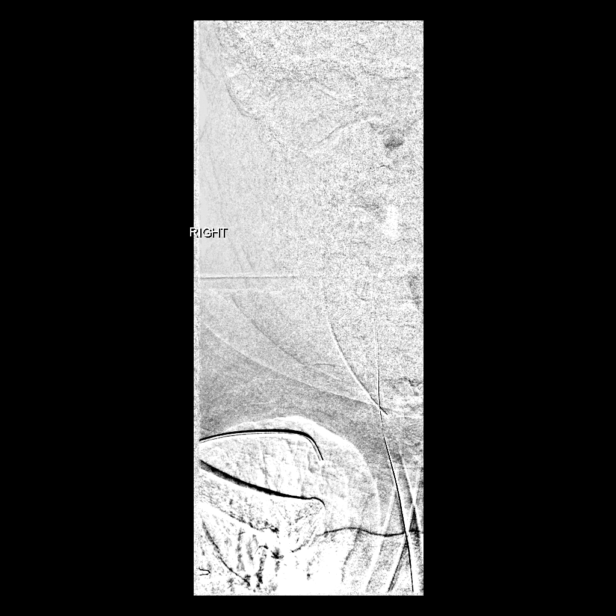

[Series 4: cerebral care 2 · 2 acquisitions, 1 frame shown (2 of 7)]
[im 1/2]
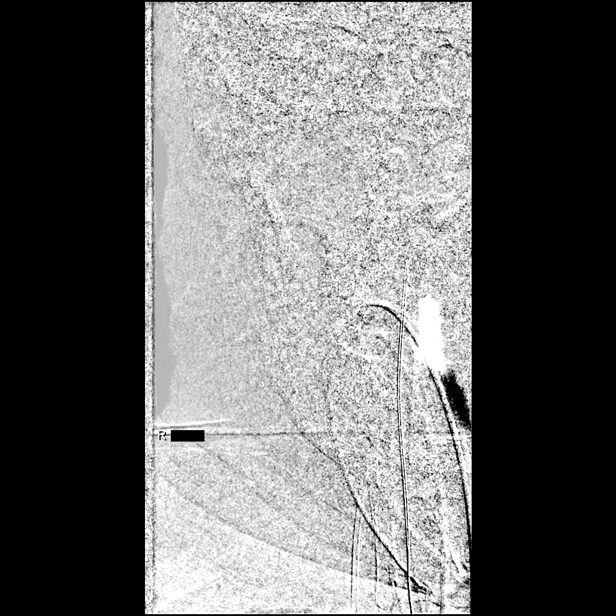

[Series 5: cerebral care 2 · 2 acquisitions, 1 frame shown (3 of 7)]
[im 1/2]
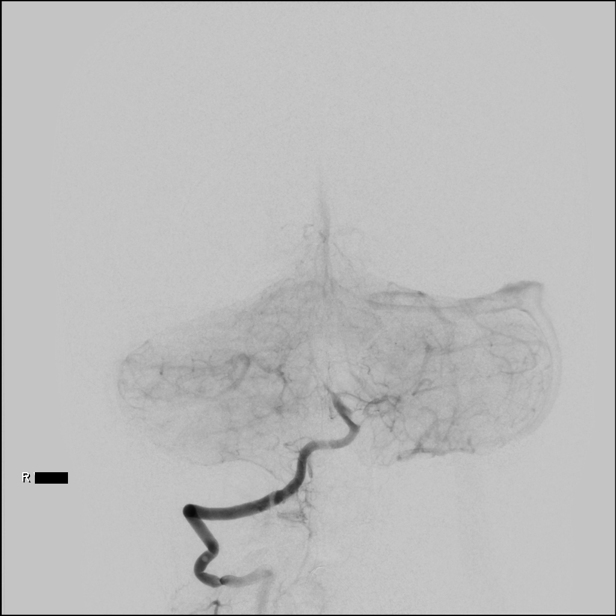

[Series 7: cerebral care 2 · 2 acquisitions, 1 frame shown (4 of 7)]
[im 1/2]
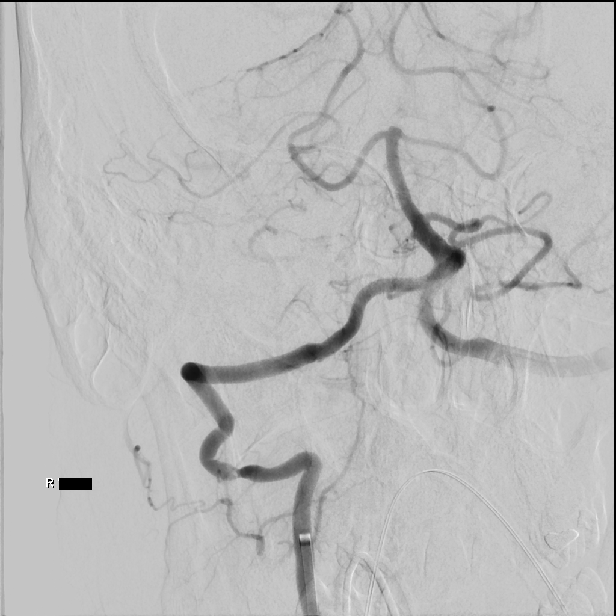

[Series 8: cerebral care 2 · 2 acquisitions, 1 frame shown (5 of 7)]
[im 1/2]
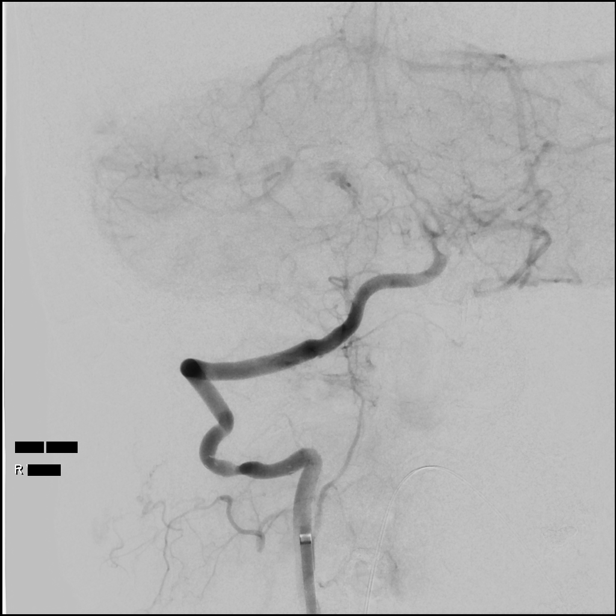

[Series 10: cerebral care 2 · 2 acquisitions, 2 frames shown (6 of 7)]
[im 1/2]
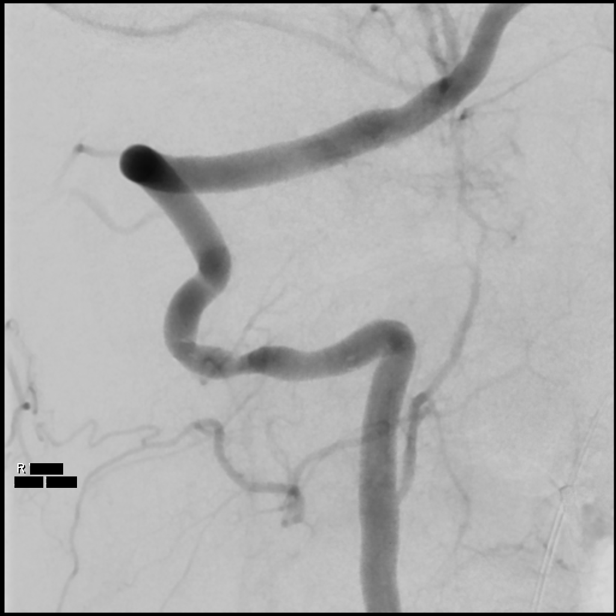
[im 1/2]
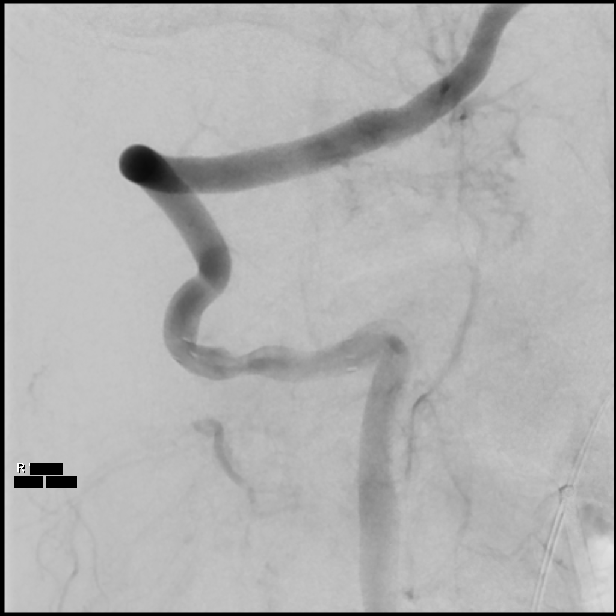

[Series 12: cerebral care 2 · 2 acquisitions, 1 frame shown (7 of 7)]
[im 1/2]
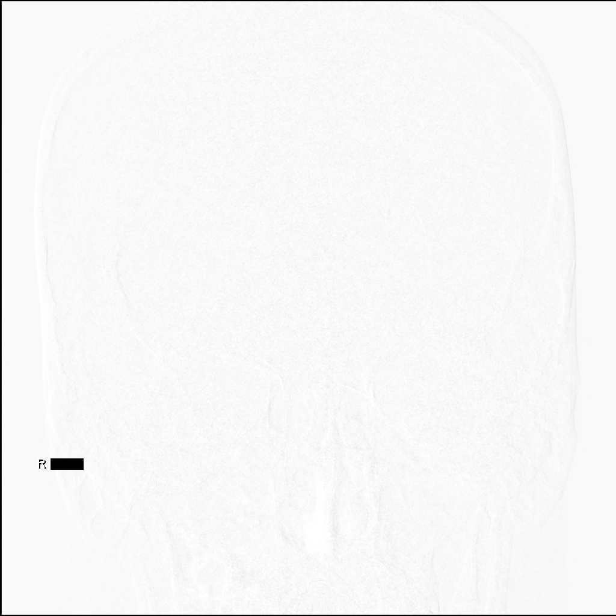

[Series 13: fl neuro n · 1 of 25 frames shown]
[frame 4/25]
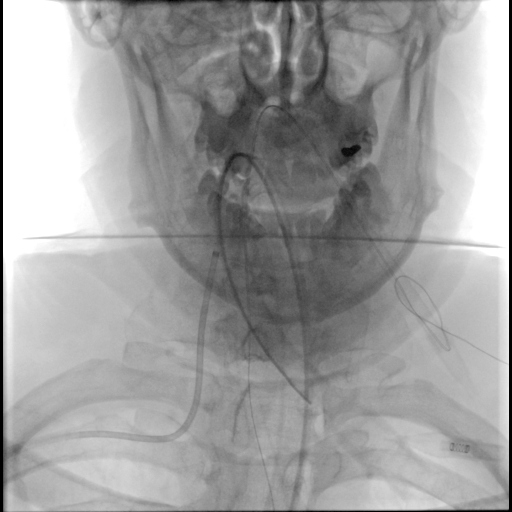

[Series 300: dr. (person_name). · 3 of 21 slices shown]
[im 3/21]
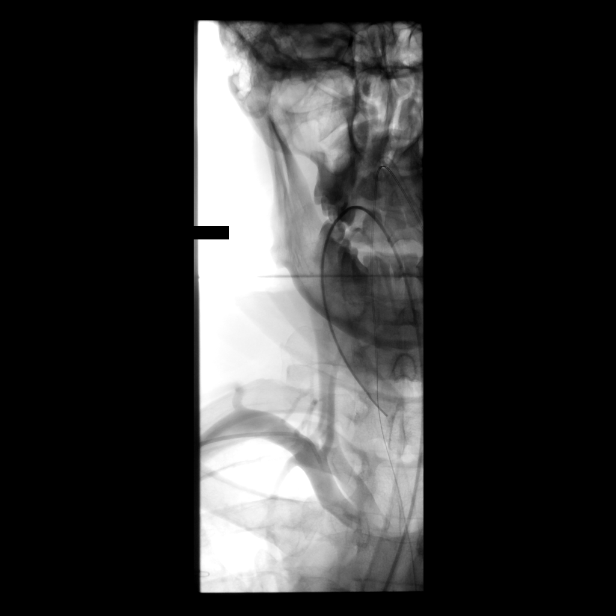
[im 11/21]
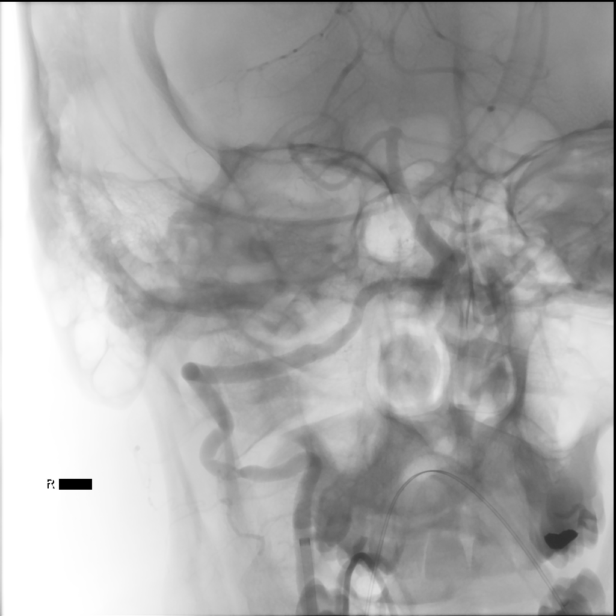
[im 21/21]
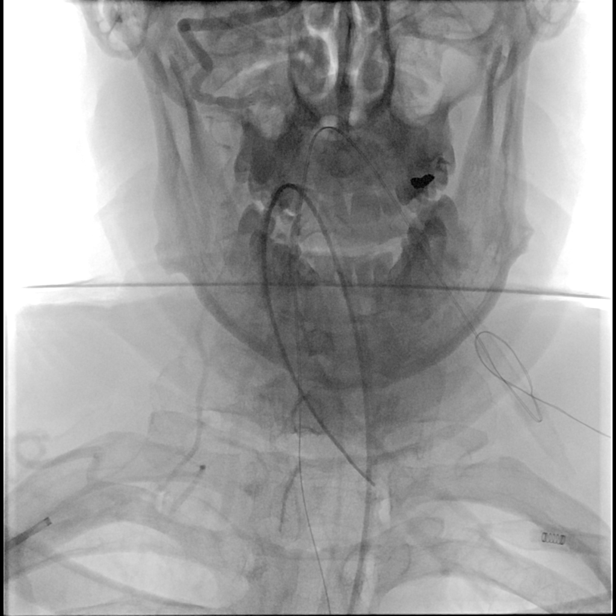

[13 of 24 positions shown; findings below may reference images not displayed]

MEDICATIONS:
No antibiotics utilized.

ANESTHESIA/SEDATION:
The procedure was performed under general anesthesia.

CONTRAST:  88 mL of Omnipaque 300 milligram/mL

FLUOROSCOPY:
Radiation Exposure Index (as provided by the fluoroscopic device):
PSD 506.8 mGy Kerma

COMPLICATIONS:
None immediate.
Maximal Sterile Barrier Technique was utilized including caps, mask,
sterile gowns, sterile gloves, sterile drape, hand hygiene and skin
antiseptic. A timeout was performed prior to the initiation of the
procedure.

Using the modified Seldinger technique and a micropuncture kit,
access was gained to the distal right radial artery at the
anatomical snuffbox and a 6 French sheath was placed. Real-time
ultrasound guidance was utilized for vascular access including the
acquisition of a permanent ultrasound image documenting patency of
the accessed vessel. Slow intra arterial infusion of 5,000 BILLIOT
heparin, 5 mg Verapamil and 200 mcg nitroglycerin diluted in
patient's own blood was performed. No significant fluctuation in
patient's blood pressure seen. Then, a right radial artery angiogram
was obtained via sheath side port. The right radial artery caliber
is adequate for vascular access.

Next, a 5 BILLIOT 2 glide catheter was navigated over a
0.035" Terumo Glidewire into the right subclavian artery under
fluoroscopic guidance. Frontal and lateral angiograms of the neck
were obtained. Using biplane roadmap guidance, the catheter was then
placed into the right vertebral artery. Frontal and lateral
angiograms of the neck were obtained followed by frontal and lateral
views of the head.
FINDINGS: 1. The right radial artery originates from the right axillary artery
(anatomical variant) and has normal caliber.
2. Redemonstrated dissection the horizontal portion of the distal V2
segment of the non dominant right vertebral artery resulting in
moderate stenosis (60%).
3. Occlusion of the right anterior inferior cerebellar artery is
again seen.

PROCEDURE:
Using biplane roadmap guidance, the BILLIOT 2 catheter was
exchanged over the wire for a 6 French benchmark guide catheter was
placed in the distal vertical portion of the V2 segment of the right
vertebral artery. Magnified frontal and lateral angiograms were
obtained in the working projections. Then, attempt was made to
advance a 3 x 15 mm resolute onyx stent into the distal V2 segment.
However, due to tight turn of the artery, attempt to advance the to
the level of dissection stent proved unsuccessful. The stent
delivery system was then withdrawn.

Next, an SL 10 microcatheter was advanced over an Aristotle 14 micro
guidewire into the V3 segment of the right vertebral artery. Then, a
3 x 15 mm neuroform atlas stent was deployed in the origin of the
segment of the distal V2/right vertebral artery, across the focal
dissection. Right vertebral artery angiograms showed adequate stent
positioning with brisk anterograde flow.

Right vertebral artery angiograms a frontal and lateral views of the
head showed no evidence of thromboembolic complication.

Delayed angiograms with magnified frontal and lateral views of the
neck and frontal and lateral views of the head showed stable brisk
flow through the stent without evidence of complication.

The catheter was subsequently withdrawn.

An inflatable band was placed and inflated over the right hand
access site. The vascular sheath was withdrawn and the band was
slowly deflated until brisk flow was noted through the arteriotomy
site. At this point, the band was reinflated with additional 2 cc of
air to obtain patent hemostasis.
IMPRESSION: Successful and uncomplicated right vertebral artery stenting at the
distal V2 segment for treatment of symptomatic dissection.

PLAN:
1. Continue on dual anti-platelet therapy.
2. Follow-up cerebral angiogram in 6 months.

## 2021-11-04 SURGERY — IR WITH ANESTHESIA
Anesthesia: General

## 2021-11-04 MED ORDER — ROCURONIUM BROMIDE 10 MG/ML (PF) SYRINGE
PREFILLED_SYRINGE | INTRAVENOUS | Status: DC | PRN
Start: 2021-11-04 — End: 2021-11-04
  Administered 2021-11-04: 60 mg via INTRAVENOUS

## 2021-11-04 MED ORDER — PROPOFOL 500 MG/50ML IV EMUL
INTRAVENOUS | Status: AC
  Filled 2021-11-04: qty 50

## 2021-11-04 MED ORDER — EPHEDRINE SULFATE-NACL 50-0.9 MG/10ML-% IV SOSY
PREFILLED_SYRINGE | INTRAVENOUS | Status: DC | PRN
Start: 2021-11-04 — End: 2021-11-04
  Administered 2021-11-04: 5 mg via INTRAVENOUS
  Administered 2021-11-04: 2.5 mg via INTRAVENOUS

## 2021-11-04 MED ORDER — FENTANYL CITRATE (PF) 100 MCG/2ML IJ SOLN: 25.0000 ug | INTRAMUSCULAR | Status: DC | PRN

## 2021-11-04 MED ORDER — LIDOCAINE HCL 1 % IJ SOLN
INTRAMUSCULAR | Status: AC
  Filled 2021-11-04: qty 20

## 2021-11-04 MED ORDER — HEPARIN SODIUM (PORCINE) 1000 UNIT/ML IJ SOLN
INTRAMUSCULAR | Status: AC
  Filled 2021-11-04: qty 10

## 2021-11-04 MED ORDER — LACTATED RINGERS IV SOLN: INTRAVENOUS | Status: DC | PRN

## 2021-11-04 MED ORDER — ASPIRIN 325 MG PO TABS
325.0000 mg | ORAL_TABLET | Freq: Every day | ORAL | Status: DC
  Administered 2021-11-05: 325 mg via ORAL
  Filled 2021-11-04: qty 1

## 2021-11-04 MED ORDER — NIMODIPINE 30 MG PO CAPS: 0.0000 mg | ORAL_CAPSULE | ORAL | Status: DC

## 2021-11-04 MED ORDER — CEFAZOLIN SODIUM-DEXTROSE 2-4 GM/100ML-% IV SOLN
2.0000 g | INTRAVENOUS | Status: AC
  Administered 2021-11-04: 2 g via INTRAVENOUS
  Filled 2021-11-04: qty 100

## 2021-11-04 MED ORDER — OXYCODONE HCL 5 MG/5ML PO SOLN: 5.0000 mg | Freq: Once | ORAL | Status: DC | PRN

## 2021-11-04 MED ORDER — ONDANSETRON HCL 4 MG/2ML IJ SOLN
INTRAMUSCULAR | Status: DC | PRN
  Administered 2021-11-04: 4 mg via INTRAVENOUS

## 2021-11-04 MED ORDER — NITROGLYCERIN 1 MG/10 ML FOR IR/CATH LAB
INTRA_ARTERIAL | Status: AC
  Filled 2021-11-04: qty 10

## 2021-11-04 MED ORDER — HEPARIN SODIUM (PORCINE) 1000 UNIT/ML IJ SOLN
INTRAMUSCULAR | Status: AC
Start: 2021-11-04 — End: 2021-11-04
  Filled 2021-11-04: qty 10

## 2021-11-04 MED ORDER — ORAL CARE MOUTH RINSE: 15.0000 mL | Freq: Once | OROMUCOSAL | Status: AC

## 2021-11-04 MED ORDER — DEXAMETHASONE SODIUM PHOSPHATE 10 MG/ML IJ SOLN
INTRAMUSCULAR | Status: DC | PRN
  Administered 2021-11-04: 10 mg via INTRAVENOUS

## 2021-11-04 MED ORDER — VERAPAMIL HCL 2.5 MG/ML IV SOLN
INTRAVENOUS | Status: AC
  Filled 2021-11-04: qty 2

## 2021-11-04 MED ORDER — PHENYLEPHRINE HCL-NACL 20-0.9 MG/250ML-% IV SOLN
INTRAVENOUS | Status: DC | PRN
  Administered 2021-11-04: 15 ug/min via INTRAVENOUS

## 2021-11-04 MED ORDER — ACETAMINOPHEN 10 MG/ML IV SOLN: 1000.0000 mg | Freq: Once | INTRAVENOUS | Status: DC | PRN

## 2021-11-04 MED ORDER — ACETAMINOPHEN 650 MG RE SUPP: 650.0000 mg | RECTAL | Status: DC | PRN

## 2021-11-04 MED ORDER — PROPOFOL 10 MG/ML IV BOLUS
INTRAVENOUS | Status: DC | PRN
  Administered 2021-11-04: 50 mg via INTRAVENOUS
  Administered 2021-11-04: 150 mg via INTRAVENOUS

## 2021-11-04 MED ORDER — IOHEXOL 300 MG/ML  SOLN
100.0000 mL | Freq: Once | INTRAMUSCULAR | Status: AC | PRN
  Administered 2021-11-04: 88 mL via INTRA_ARTERIAL

## 2021-11-04 MED ORDER — ACETAMINOPHEN 160 MG/5ML PO SOLN: 650.0000 mg | ORAL | Status: DC | PRN

## 2021-11-04 MED ORDER — HEPARIN (PORCINE) 25000 UT/250ML-% IV SOLN: 800.0000 [IU]/h | INTRAVENOUS | Status: AC

## 2021-11-04 MED ORDER — FENTANYL CITRATE (PF) 250 MCG/5ML IJ SOLN
INTRAMUSCULAR | Status: DC | PRN
  Administered 2021-11-04: 50 ug via INTRAVENOUS

## 2021-11-04 MED ORDER — ACETAMINOPHEN 325 MG PO TABS
650.0000 mg | ORAL_TABLET | ORAL | Status: DC | PRN
  Administered 2021-11-04 – 2021-11-05 (×3): 650 mg via ORAL
  Filled 2021-11-04 (×3): qty 2

## 2021-11-04 MED ORDER — VERAPAMIL HCL 2.5 MG/ML IV SOLN
INTRAVENOUS | Status: DC | PRN
  Administered 2021-11-04 (×2): 5 mg via INTRA_ARTERIAL

## 2021-11-04 MED ORDER — CLOPIDOGREL BISULFATE 75 MG PO TABS
75.0000 mg | ORAL_TABLET | Freq: Every day | ORAL | Status: DC
Start: 2021-11-05 — End: 2021-11-05

## 2021-11-04 MED ORDER — CHLORHEXIDINE GLUCONATE 0.12 % MT SOLN
15.0000 mL | Freq: Once | OROMUCOSAL | Status: AC
  Administered 2021-11-04: 15 mL via OROMUCOSAL
  Filled 2021-11-04: qty 15

## 2021-11-04 MED ORDER — HEPARIN SODIUM (PORCINE) 1000 UNIT/ML IJ SOLN
INTRAMUSCULAR | Status: DC | PRN
  Administered 2021-11-04: 5000 [IU] via INTRAVENOUS

## 2021-11-04 MED ORDER — CLOPIDOGREL BISULFATE 75 MG PO TABS: 75.0000 mg | ORAL_TABLET | ORAL | Status: DC

## 2021-11-04 MED ORDER — AMISULPRIDE (ANTIEMETIC) 5 MG/2ML IV SOLN: 10.0000 mg | Freq: Once | INTRAVENOUS | Status: DC | PRN

## 2021-11-04 MED ORDER — OXYCODONE HCL 5 MG PO TABS: 5.0000 mg | ORAL_TABLET | Freq: Once | ORAL | Status: DC | PRN

## 2021-11-04 MED ORDER — NITROGLYCERIN 1 MG/10 ML FOR IR/CATH LAB
INTRA_ARTERIAL | Status: DC | PRN
  Administered 2021-11-04: 200 ug via INTRA_ARTERIAL

## 2021-11-04 MED ORDER — CLEVIDIPINE BUTYRATE 0.5 MG/ML IV EMUL
0.0000 mg/h | INTRAVENOUS | Status: DC
  Administered 2021-11-04: 2 mg/h via INTRAVENOUS
  Filled 2021-11-04 (×2): qty 50

## 2021-11-04 MED ORDER — ONDANSETRON HCL 4 MG/2ML IJ SOLN: 4.0000 mg | Freq: Four times a day (QID) | INTRAMUSCULAR | Status: DC | PRN

## 2021-11-04 MED ORDER — LIDOCAINE 2% (20 MG/ML) 5 ML SYRINGE
INTRAMUSCULAR | Status: DC | PRN
  Administered 2021-11-04: 40 mg via INTRAVENOUS

## 2021-11-04 MED ORDER — PHENYLEPHRINE 80 MCG/ML (10ML) SYRINGE FOR IV PUSH (FOR BLOOD PRESSURE SUPPORT)
PREFILLED_SYRINGE | INTRAVENOUS | Status: DC | PRN
  Administered 2021-11-04 (×2): 80 ug via INTRAVENOUS

## 2021-11-04 MED ORDER — CLOPIDOGREL BISULFATE 75 MG PO TABS
75.0000 mg | ORAL_TABLET | Freq: Every day | ORAL | Status: DC
  Administered 2021-11-05: 75 mg via ORAL
  Filled 2021-11-04: qty 1

## 2021-11-04 MED ORDER — HEPARIN (PORCINE) 25000 UT/250ML-% IV SOLN
650.0000 [IU]/h | INTRAVENOUS | Status: DC
  Administered 2021-11-04: 650 [IU]/h via INTRAVENOUS

## 2021-11-04 MED ORDER — SODIUM CHLORIDE 0.9 % IV SOLN: INTRAVENOUS | Status: DC

## 2021-11-04 MED ORDER — HEPARIN SODIUM (PORCINE) 1000 UNIT/ML IJ SOLN
INTRAMUSCULAR | Status: DC | PRN
  Administered 2021-11-04: 3000 [IU] via INTRAVENOUS

## 2021-11-04 MED ORDER — FENTANYL CITRATE (PF) 100 MCG/2ML IJ SOLN
INTRAMUSCULAR | Status: AC
  Filled 2021-11-04: qty 2

## 2021-11-04 MED ORDER — ACETAMINOPHEN 160 MG/5ML PO SOLN: 325.0000 mg | ORAL | Status: DC | PRN

## 2021-11-04 MED ORDER — ACETAMINOPHEN 325 MG PO TABS: 325.0000 mg | ORAL_TABLET | ORAL | Status: DC | PRN

## 2021-11-04 MED ORDER — SUGAMMADEX SODIUM 200 MG/2ML IV SOLN
INTRAVENOUS | Status: DC | PRN
  Administered 2021-11-04: 200 mg via INTRAVENOUS

## 2021-11-04 MED ORDER — HEPARIN (PORCINE) 25000 UT/250ML-% IV SOLN
500.0000 [IU]/h | INTRAVENOUS | Status: DC
  Administered 2021-11-04: 500 [IU]/h via INTRAVENOUS

## 2021-11-04 MED ORDER — HEPARIN (PORCINE) 25000 UT/250ML-% IV SOLN
INTRAVENOUS | Status: AC
  Filled 2021-11-04: qty 250

## 2021-11-04 MED ORDER — ASPIRIN 81 MG PO CHEW: 324.0000 mg | CHEWABLE_TABLET | Freq: Every day | ORAL | Status: DC

## 2021-11-04 NOTE — Anesthesia Procedure Notes (Signed)
Procedure Name: Intubation ?Date/Time: 11/04/2021 9:02 AM ?Performed by: Lorie Phenix, CRNA ?Pre-anesthesia Checklist: Patient identified, Emergency Drugs available, Suction available and Patient being monitored ?Patient Re-evaluated:Patient Re-evaluated prior to induction ?Oxygen Delivery Method: Circle system utilized ?Preoxygenation: Pre-oxygenation with 100% oxygen ?Induction Type: IV induction ?Ventilation: Mask ventilation without difficulty ?Laryngoscope Size: Mac ?Grade View: Grade I ?Tube type: Oral ?Tube size: 7.5 mm ?Number of attempts: 1 ?Airway Equipment and Method: Stylet ?Placement Confirmation: ETT inserted through vocal cords under direct vision, positive ETCO2 and breath sounds checked- equal and bilateral ?Secured at: 23 cm ?Tube secured with: Tape ?Dental Injury: Teeth and Oropharynx as per pre-operative assessment  ? ? ? ? ?

## 2021-11-04 NOTE — Progress Notes (Signed)
?  Transition of Care (TOC) Screening Note ? ? ?Patient Details  ?Name: Christopher Pugh ?Date of Birth: 1971-03-30 ? ? ?Transition of Care (TOC) CM/SW Contact:    ?Benard Halsted, LCSW ?Phone Number: ?11/04/2021, 5:33 PM ? ? ? ?Transition of Care Department [facility]) has reviewed patient. We will continue to monitor patient advancement through interdisciplinary progression rounds. If new patient transition needs arise, please place a TOC consult. ? ? ?

## 2021-11-04 NOTE — Progress Notes (Signed)
ANTICOAGULATION CONSULT NOTE - ? ?Pharmacy Consult for IV heparin ?Indication:  Neuro IR ? ?No Known Allergies ? ?Patient Measurements: ?Height: '5\' 8"'$  (172.7 cm) ?Weight: 84.4 kg (186 lb) ?IBW/kg (Calculated) : 68.4 ?Heparin Dosing Weight: 84.4 kg ? ?Vital Signs: ?Temp: 97.8 ?F (36.6 ?C) (05/03 1210) ?Temp Source: Oral (05/03 8341) ?BP: 108/80 (05/03 1300) ?Pulse Rate: 68 (05/03 1315) ? ?Labs: ?Recent Labs  ?  11/04/21 ?0627  ?HGB 12.6*  ?HCT 36.4*  ?PLT 189  ?LABPROT 13.0  ?INR 1.0  ?CREATININE 1.14  ? ? ?Estimated Creatinine Clearance: 81.1 mL/min (by C-G formula based on SCr of 1.14 mg/dL). ? ? ?Medical History: ?Past Medical History:  ?Diagnosis Date  ? BPH without obstruction/lower urinary tract symptoms   ? Closed head injury with concussion   ? x 2  ? CVA (cerebral vascular accident) ([place]) 10/19/2021  ? DIzzy, off balance  ? Headache   ? Hypogonadism in male   ? Medical history non-contributory   ? ? ?Medications:  ?Infusions:  ? clevidipine 2 mg/hr (11/04/21 1319)  ? heparin    ? heparin    ? ? 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 Pharmacy consulted for IV heparin.  No anticoagulation prior to arrival ? ? ?Goal of Therapy:  ?Heparin level 0.1-0.25 units/ml ?Monitor platelets by anticoagulation protocol: Yes ?  ?Plan:  ?Heparin drip at 650 units/hr until 8am on 5/4 ?Heparin level at 2000 ?Daily heparin level and CBC ordered ?Monitor for signs/symptoms of bleed ? ?Thank you for allowing pharmacy to be a part of this patient?s care. ? ?Donnald Garre, PharmD ?Clinical Pharmacist ? ?Please check AMION for all [place] numbers ?After 10:00 PM, call [place] [phone] ? ? ?

## 2021-11-04 NOTE — Procedures (Signed)
INTERVENTIONAL NEURORADIOLOGY BRIEF POSTPROCEDURE NOTE ? ?DIAGNOSTIC CEREBRAL ANGIOGRAM AND RIGHT VERTEBRAL ARTERY STENTING  ?  ?Attending: Dr. Pedro Earls ?  ?Assistant: None. ?  ?Diagnosis: Right vertebral artery dissection  ?  ?Access site: Distal right radial artery  ?  ?Access closure: Inflatable band  ?  ?Anesthesia: General anesthesia  ?  ?Medication used: Refer to anesthesia documentation for sedation medication. ?  ?Complications: None.  ?  ?Estimated blood loss: None.  ?  ?Specimen: None.  ?  ?Findings: Dissection of the distal V2 segment of the right vertebral artery.  Stenting of the dissected segment performed with a 3 x 15 neuroform atlas stent.  No evidence of thromboembolic complication.  ?  ?The patient tolerated the procedure well without incident or complication and is in stable condition. ?  ?PLAN: ?Transferred to ICU for continued care. ?SBT 120-140 mmHg.  ? ?  ?

## 2021-11-04 NOTE — Progress Notes (Signed)
Notified Dr. Debbrah Alar of pt's SBP in the low 100's.  Written SBP goal is 120-140 per Dr. Loyola Mast note.  MD said she is okay with his SBP being below 120 as long as SBP is > 95.     ?

## 2021-11-04 NOTE — Progress Notes (Signed)
ANTICOAGULATION CONSULT NOTE - ? ?Pharmacy Consult for IV heparin ?Indication:  Neuro IR ? ?No Known Allergies ? ?Patient Measurements: ?Height: '5\' 8"'$  (172.7 cm) ?Weight: 84.4 kg (186 lb) ?IBW/kg (Calculated) : 68.4 ?Heparin Dosing Weight: 84.4 kg ? ?Vital Signs: ?Temp: 98 ?F (36.7 ?C) (05/03 1600) ?Temp Source: Oral (05/03 1600) ?BP: 117/72 (05/03 1900) ?Pulse Rate: 74 (05/03 1900) ? ?Labs: ?Recent Labs  ?  11/04/21 ?0627 11/04/21 ?2000  ?HGB 12.6*  --   ?HCT 36.4*  --   ?PLT 189  --   ?LABPROT 13.0  --   ?INR 1.0  --   ?HEPARINUNFRC  --  <0.10*  ?CREATININE 1.14  --   ? ? ? ?Estimated Creatinine Clearance: 81.1 mL/min (by C-G formula based on SCr of 1.14 mg/dL). ? ? ?Medical History: ?Past Medical History:  ?Diagnosis Date  ? BPH without obstruction/lower urinary tract symptoms   ? Closed head injury with concussion   ? x 2  ? CVA (cerebral vascular accident) ([place]) 10/19/2021  ? DIzzy, off balance  ? Headache   ? Hypogonadism in male   ? Medical history non-contributory   ? ? ?Medications:  ?Infusions:  ? clevidipine 2 mg/hr (11/04/21 1319)  ? heparin    ? heparin    ? ? 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 Pharmacy consulted for IV heparin.  No anticoagulation prior to arrival ? ?Heparin level came back undetectable tonight. We will increase the rate until 0800 for stopping.  ? ?Goal of Therapy:  ?Heparin level 0.1-0.25 units/ml ?Monitor platelets by anticoagulation protocol: Yes ?  ?Plan:  ?Increase heparin drip to 900 units/hr until 8am on 5/4 ?Monitor for signs/symptoms of bleed ? ?Onnie Boer, PharmD, BCIDP, AAHIVP, CPP ?Infectious Disease Pharmacist ?11/04/2021 8:51 PM ? ? ? ? ? ?

## 2021-11-04 NOTE — Sedation Documentation (Signed)
Patient transported to PACU with CRNA-Millie. Danielle RN at the bedside to receive patient. Right snuff box in place. Site is clean, dry and intact. No drainage noted from site. No hematoma, soft to palpation. +1 palpable pulse in tact.  ?

## 2021-11-04 NOTE — Anesthesia Preprocedure Evaluation (Addendum)
Anesthesia Evaluation  ?Patient identified by MRN, date of birth, ID band ?Patient awake ? ? ? ?Reviewed: ?Allergy & Precautions, NPO status , Patient's Chart, lab work & pertinent test results ? ?Airway ?Mallampati: I ? ?TM Distance: >3 FB ?Neck ROM: Full ? ? ? Dental ? ?(+) Teeth Intact, Dental Advisory Given ?  ?Pulmonary ?neg pulmonary ROS,  ?  ?breath sounds clear to auscultation ? ? ? ? ? ? Cardiovascular ?negative cardio ROS ? ? ?Rhythm:Regular Rate:Normal ? ? ?  ?Neuro/Psych ? Headaches, TIACVA   ? GI/Hepatic ?negative GI ROS, Neg liver ROS,   ?Endo/Other  ?negative endocrine ROS ? Renal/GU ?negative Renal ROS  ? ?  ?Musculoskeletal ?negative musculoskeletal ROS ?(+)  ? Abdominal ?Normal abdominal exam  (+)   ?Peds ? Hematology ?negative hematology ROS ?(+)   ?Anesthesia Other Findings ? ? Reproductive/Obstetrics ? ?  ? ? ? ? ? ? ? ? ? ? ? ? ? ?  ?  ? ? ? ? ? ? ? ?Anesthesia Physical ?Anesthesia Plan ? ?ASA: 3 ? ?Anesthesia Plan: General  ? ?Post-op Pain Management:   ? ?Induction: Intravenous ? ?PONV Risk Score and Plan: 2 and Ondansetron and Propofol infusion ? ?Airway Management Planned: Oral ETT ? ?Additional Equipment: Arterial line ? ?Intra-op Plan:  ? ?Post-operative Plan: Extubation in OR ? ?Informed Consent: I have reviewed the patients History and Physical, chart, labs and discussed the procedure including the risks, benefits and alternatives for the proposed anesthesia with the patient or authorized representative who has indicated his/her understanding and acceptance.  ? ? ? ?Dental advisory given ? ?Plan Discussed with: CRNA ? ?Anesthesia Plan Comments:   ? ? ? ? ? ?Anesthesia Quick Evaluation ? ?

## 2021-11-04 NOTE — Progress Notes (Addendum)
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 Karenann Cai this AM.  ? ?Patient seen with Dr. Karenann Cai at bedside.  ? ?Sitting in bed, NAD. Family members at bedside.  ?States that he is feeling great, still feels little dizzy.  ? ?The possible etiology of the  R VA dissection was discussed with the patient by Dr. Karenann Cai, it was most likely cause by widening of C1 after fx which cause slight impingement/irregularity against R VA which could have caused the dissection over time. ?Recommended patient to limit right lateral movement (flexion) of the neck.  ?Patient states that he will be ok to put on a soft C collar occasionally to prevent neck movement.  ? ?VSS ?Right RA punctures site stable, no s/s bleeding or pseudoaneurysm.  ?[facility] coagulation las was called for P2Y12 result, informed that it is 112.  ? ?PLAN  ?- Patient to stay overnight observation, plan to discharge tomorrow if stable.  ?- Continue Plavix 75 mg and ASA 81 mg daily, patient was strongly encourage to reach out to Dr. Karenann Cai if any of Plavix or aspirin needs to be stopped for elective procedures ?- Follow up cerebral angiogram in 6 month to eval stent patency - order placed.  ? ?Please call NIR for questions and concerns. ?NIR to follow.  ? ?Tera Mater PA-C ?11/04/2021 3:37 PM ? ? ? ? ? ?

## 2021-11-04 NOTE — Transfer of Care (Signed)
Immediate Anesthesia Transfer of Care Note ? ?Patient: Christopher Pugh ? ?Procedure(s) Performed: ANGIOPLASTY/STENTING ? ?Patient Location: PACU ? ?Anesthesia Type:General ? ?Level of Consciousness: awake and alert  ? ?Airway & Oxygen Therapy: Patient Spontanous Breathing ? ?Post-op Assessment: Report given to RN and Post -op Vital signs reviewed and stable ? ?Post vital signs: Reviewed and stable ? ?Last Vitals:  ?Vitals Value Taken Time  ?BP 128/68   ?Temp    ?Pulse 78   ?Resp 12   ?SpO2 100   ? ? ?Last Pain:  ?Vitals:  ? 11/04/21 0716  ?TempSrc:   ?PainSc: 0-No pain  ?   ? ?Patients Stated Pain Goal: 0 (11/04/21 0716) ? ?Complications: No notable events documented. ?

## 2021-11-04 NOTE — Anesthesia Postprocedure Evaluation (Signed)
Anesthesia Post Note ? ?Patient: Christopher Pugh ? ?Procedure(s) Performed: ANGIOPLASTY/STENTING ? ?  ? ?Patient location during evaluation: PACU ?Anesthesia Type: General ?Level of consciousness: awake and alert ?Pain management: pain level controlled ?Vital Signs Assessment: post-procedure vital signs reviewed and stable ?Respiratory status: spontaneous breathing, nonlabored ventilation, respiratory function stable and patient connected to nasal cannula oxygen ?Cardiovascular status: blood pressure returned to baseline and stable ?Postop Assessment: no apparent nausea or vomiting ?Anesthetic complications: no ? ? ?No notable events documented. ? ?Last Vitals:  ?Vitals:  ? 11/04/21 1155 11/04/21 1210  ?BP: 109/77 109/77  ?Pulse: (!) 59 61  ?Resp: 16 14  ?Temp:  36.6 ?C  ?SpO2: 100% 99%  ?  ?Last Pain:  ?Vitals:  ? 11/04/21 1210  ?TempSrc:   ?PainSc: 0-No pain  ? ? ?  ?  ?  ?  ?  ?  ? ?[name] ? ? ? ? ?

## 2021-11-04 NOTE — Anesthesia Procedure Notes (Signed)
Arterial Line Insertion ?Start/End5/09/2021 7:50 AM, 11/04/2021 7:50 AM ?Performed by: CRNA ? Patient location: Pre-op. ?Preanesthetic checklist: patient identified, IV checked, site marked, risks and benefits discussed, surgical consent, monitors and equipment checked, pre-op evaluation, timeout performed and anesthesia consent ?Lidocaine 1% used for infiltration ?Left, radial was placed ?Catheter size: 20 G ?Hand hygiene performed  and maximum sterile barriers used  ? ?Attempts: 1 ?Procedure performed without using ultrasound guided technique. ?Following insertion, Biopatch and dressing applied. ?Post procedure assessment: normal ? ?Patient tolerated the procedure well with no immediate complications. ? ? ?

## 2021-11-05 ENCOUNTER — Other Ambulatory Visit (HOSPITAL_COMMUNITY): Payer: Self-pay | Admitting: Radiology

## 2021-11-05 ENCOUNTER — Encounter (HOSPITAL_COMMUNITY): Payer: Self-pay

## 2021-11-05 DIAGNOSIS — I63211 Cerebral infarction due to unspecified occlusion or stenosis of right vertebral arteries: Secondary | ICD-10-CM

## 2021-11-05 DIAGNOSIS — I7774 Dissection of vertebral artery: Secondary | ICD-10-CM | POA: Diagnosis not present

## 2021-11-05 LAB — PLATELET INHIBITION P2Y12

## 2021-11-05 LAB — POCT ACTIVATED CLOTTING TIME: Activated Clotting Time: 203 seconds

## 2021-11-05 MED ORDER — CHLORHEXIDINE GLUCONATE CLOTH 2 % EX PADS: 6.0000 | MEDICATED_PAD | Freq: Every day | CUTANEOUS | Status: DC

## 2021-11-05 NOTE — Discharge Summary (Signed)
Physician Discharge Summary  ? ? ? ? ?Patient ID: ?Christopher Pugh ?MRN: [id number] ?DOB/AGE: 07/29/1970 51 y.o. ? ?Admit date: 11/04/2021 ?Discharge date: 11/05/2021 ? ?Admission Diagnoses: ?Principal Problem: ?  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 ?Discharge Diagnoses:  ?Principal Problem: ?  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  ? ?Procedures: ?Procedure(s): ?Cerebral arteriogram with stent angioplasty of right vertebral artery. ? ?Discharged Condition: good ? ?Hospital Course: ?Pt admitted 5/3 with right vertebral artery dissection and planned intervention. ?Underwent successful angiogram with stent angioplasty, no immediate complications.  ?POD #1, pt doing very well. Some mild posterior neck 'soreness', no real pain. ?Denies N/V, has eaten regular diet. ?No issues from (R)radial art stick, (L)a-line to be removed prior to discharge. ?Stable for discharge. ?Reviewed all medications and instructions. ?Continue Plavix 75 mg and ASA 81 mg daily, patient was strongly encourage to reach out to Dr. Karenann Cai if any of Plavix or aspirin needs to be stopped for elective procedures ?- Follow up cerebral angiogram in 6 month to eval stent patency - order placed ? ? ?Consults: None ? ? ?Discharge Exam: ?Blood pressure 115/83, pulse 75, temperature 97.8 ?F (36.6 ?C), resp. rate 17, height '5\' 8"'$  (1.727 m), weight 84.4 kg, SpO2 96 %. ?General: NAD ?Lungs: CTA without w/r/r ?Heart: Regular ?Ext: (R)radial puncture site soft, NT, hand warm, brisk cap refill ?Neuro:A&O x 3 ?PERRLA, EOMI ?Face symmetric, speech clear. ?Strength 5/5 ? ? ?Disposition:  ?Discharge disposition: 01-Home or Self Care ? ? ? ? ? ? ?Discharge Instructions   ? ? Call MD for:  difficulty breathing, headache or visual disturbances   Complete by: As directed ?  ? Call MD for:  redness, tenderness, or signs of infection (pain, swelling, redness, odor or green/yellow discharge around incision site)   Complete by: As directed ?  ? Call MD for:  severe  uncontrolled pain   Complete by: As directed ?  ? Call MD for:  temperature >100.4   Complete by: As directed ?  ? Diet - low sodium heart healthy   Complete by: As directed ?  ? Increase activity slowly   Complete by: As directed ?  ? Lifting restrictions   Complete by: As directed ?  ? No heavy lifting for 7-10 days.  ? May shower / Bathe   Complete by: As directed ?  ? Remove dressing in 24 hours   Complete by: As directed ?  ? ?  ? ?Allergies as of 11/05/2021   ?No Known Allergies ?  ? ?  ?Medication List  ?  ? ?TAKE these medications   ? ?aspirin 81 MG EC tablet ?Take 1 tablet (81 mg total) by mouth daily. Swallow whole. ?  ?clopidogrel 75 MG tablet ?Commonly known as: PLAVIX ?Take 1 tablet (75 mg total) by mouth daily. ?  ?D3 PO ?Take 1 capsule by mouth daily. ?  ?ibuprofen 200 MG tablet ?Commonly known as: ADVIL ?Take 600 mg by mouth every 6 (six) hours as needed for headache or mild pain. ?  ?multivitamin tablet ?Take 1 tablet by mouth daily. ?  ?rosuvastatin 20 MG tablet ?Commonly known as: CRESTOR ?Take 2 tablets (40 mg total) by mouth daily. ?  ?tadalafil 5 MG tablet ?Commonly known as: CIALIS ?Take 5 mg by mouth daily. ?  ? ?  ? ? Follow-up Information   ? ? de Rosario Jacks, MD Follow up.   ?Specialties: Radiology, Interventional Radiology ?Why: May call questions or concerns. ?Otherwise will contact  you to schedule 6 month imaging follow up. ?Contact information: ?[address] ?[place] [address] ?[phone] ? ? ?  ?  ? ?  ?  ? ?  ? ? ?Signed: ?Ascencion Dike PA-C ?11/05/2021, 9:14 AM ? ? ? ?

## 2021-11-10 ENCOUNTER — Institutional Professional Consult (permissible substitution): Payer: BC Managed Care – PPO | Admitting: Student

## 2021-11-10 DIAGNOSIS — I6349 Cerebral infarction due to embolism of other cerebral artery: Secondary | ICD-10-CM

## 2021-11-11 ENCOUNTER — Ambulatory Visit: Payer: BC Managed Care – PPO | Admitting: Physical Therapy

## 2021-11-13 ENCOUNTER — Ambulatory Visit (INDEPENDENT_AMBULATORY_CARE_PROVIDER_SITE_OTHER): Payer: BC Managed Care – PPO | Admitting: Diagnostic Neuroimaging

## 2021-11-13 ENCOUNTER — Encounter: Payer: Self-pay | Admitting: Diagnostic Neuroimaging

## 2021-11-13 VITALS — BP 113/75 | HR 64 | Ht 68.0 in | Wt 195.4 lb

## 2021-11-13 DIAGNOSIS — I63211 Cerebral infarction due to unspecified occlusion or stenosis of right vertebral arteries: Secondary | ICD-10-CM | POA: Diagnosis not present

## 2021-11-13 DIAGNOSIS — I7774 Dissection of vertebral artery: Secondary | ICD-10-CM

## 2021-11-13 MED ORDER — ROSUVASTATIN CALCIUM 40 MG PO TABS: 40.0000 mg | ORAL_TABLET | Freq: Every day | ORAL | 12 refills | Status: AC

## 2021-11-13 MED ORDER — CLOPIDOGREL BISULFATE 75 MG PO TABS: 75.0000 mg | ORAL_TABLET | Freq: Every day | ORAL | 6 refills | Status: DC
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
## 2021-11-16 ENCOUNTER — Ambulatory Visit: Payer: BC Managed Care – PPO | Admitting: Physical Therapy

## 2021-11-16 ENCOUNTER — Inpatient Hospital Stay: Payer: Self-pay | Admitting: Neurology

## 2021-11-19 ENCOUNTER — Ambulatory Visit: Payer: BC Managed Care – PPO | Attending: Neuroradiology | Admitting: Physical Therapy

## 2021-11-19 ENCOUNTER — Encounter: Payer: Self-pay | Admitting: Physical Therapy

## 2021-11-19 VITALS — BP 126/87 | HR 60

## 2021-11-19 DIAGNOSIS — I63211 Cerebral infarction due to unspecified occlusion or stenosis of right vertebral arteries: Secondary | ICD-10-CM | POA: Insufficient documentation

## 2021-11-19 DIAGNOSIS — R2681 Unsteadiness on feet: Secondary | ICD-10-CM | POA: Insufficient documentation

## 2021-11-19 DIAGNOSIS — R262 Difficulty in walking, not elsewhere classified: Secondary | ICD-10-CM | POA: Diagnosis present

## 2021-11-19 DIAGNOSIS — R2689 Other abnormalities of gait and mobility: Secondary | ICD-10-CM | POA: Diagnosis present

## 2021-11-19 DIAGNOSIS — R42 Dizziness and giddiness: Secondary | ICD-10-CM | POA: Diagnosis present

## 2021-11-19 NOTE — Therapy (Signed)
OUTPATIENT PHYSICAL THERAPY NEURO EVALUATION   Patient Name: Christopher Pugh MRN: [id number] DOB:1971-05-20, 51 y.o., male Today's Date: 11/20/2021  PCP: Chesley Noon, MD REFERRING PROVIDER: Pedro Earls, MD (pt being followed by Dr. Leta Baptist)   PT End of Session - 11/20/21 0808     Visit Number 1    Number of Visits 9    Date for PT Re-Evaluation 01/19/22    Authorization Type BCBS    PT Start Time 0934    PT Stop Time 1016    PT Time Calculation (min) 42 min    Activity Tolerance Patient tolerated treatment well    Behavior During Therapy [facility] for tasks assessed/performed             Past Medical History:  Diagnosis Date   BPH without obstruction/lower urinary tract symptoms    Closed head injury with concussion    x 2   CVA (cerebral vascular accident) ([place]) 10/19/2021   DIzzy, off balance   Headache    Hypogonadism in male    Medical history non-contributory    Past Surgical History:  Procedure Laterality Date   ELBOW SURGERY Right 06/2021   IR ANGIO VERTEBRAL SEL VERTEBRAL BILAT MOD SED  11/01/2021   IR ANGIO VERTEBRAL SEL VERTEBRAL UNI R MOD SED  11/04/2021   IR ANGIOGRAM EXTREMITY RIGHT  11/04/2021   IR CT HEAD LTD  10/19/2021   IR INTRA CRAN STENT  11/04/2021   IR PERCUTANEOUS ART THROMBECTOMY/INFUSION INTRACRANIAL INC DIAG ANGIO  10/19/2021   IR US GUIDE VASC ACCESS RIGHT  10/19/2021   IR US GUIDE VASC ACCESS RIGHT  11/04/2021   RADIOLOGY WITH ANESTHESIA N/A 10/19/2021   Procedure: IR WITH ANESTHESIA;  Surgeon: Radiologist, Medication, MD;  Location: [place];  Service: Radiology;  Laterality: N/A;   RADIOLOGY WITH ANESTHESIA N/A 11/04/2021   Procedure: ANGIOPLASTY/STENTING;  Surgeon: Luanne Bras, MD;  Location: [place];  Service: Radiology;  Laterality: N/A;   Patient Active Problem List   Diagnosis Date Noted   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   TIA (transient ischemic attack) 10/30/2021   HLD (hyperlipidemia) 10/30/2021    Cerebrovascular accident (CVA) ([place]) 10/19/2021   BPH without obstruction/lower urinary tract symptoms 10/19/2021   Hypogonadism in male 10/19/2021   Alcohol abuse 10/19/2021   C1 cervical fracture ([place]) 10/19/2021    ONSET DATE: 11/05/2021 (date of referral)  REFERRING DIAG:  I63.211 (ICD-10-CM) - Cerebrovascular accident (CVA) due to stenosis of right vertebral artery ([place])    THERAPY DIAG:  Difficulty in walking, not elsewhere classified  Dizziness and giddiness  Unsteadiness on feet  Other abnormalities of gait and mobility  SUBJECTIVE:  SUBJECTIVE STATEMENT: Returns to therapy after new referral after new CVA and vertebral artery dissection (see pertinent history for further details).  Still feeling some dizziness now, can't tell if its gotten better or if he is learning to function with it. Riding in the car isn't as bad. Open areas where there is a lot of movement is overwhelming and louder areas. Reports feeling more irritable. Feels like his balance is ok, feels like he is walking straighter and navigating stairs is easier. When he stops and changes paces, feels like he needs to stop and take a step. Tinnitus is still there in his R>L ear. Notices some more dizziness when going up stairs. Going down stairs can sense that he is going faster and needs to try to slow down. Has been doing some walking/light jogging, doing some light machines at the gym. Rolled over too quickly and had a dizzy spell. Walking 2-3 miles everyday   Pt accompanied by: self  PERTINENT HISTORY: PERTINENT HISTORY: BPH, hypogonadism, C1 Burst Fx   51 y.o. 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  He was discharged on 11/01/2021 for outpatient follow-up.  Patient had right vertebral artery stenting on 10/04/3242, without complication.  He was discharged on aspirin and Plavix.    PAIN:  Are you having pain? No  Vitals:   11/19/21 0952  BP: 126/87  Pulse: 60     PRECAUTIONS: Other: No driving.  No heavy lifting, no upside down.   WEIGHT BEARING RESTRICTIONS No  FALLS: Has patient fallen in last 6 months? 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   OBJECTIVE:   COGNITION: Overall cognitive status: Within functional limits for tasks assessed   SENSATION: WFL    POSTURE: No Significant postural limitations    MMT:    5/5 bilat MMT     TRANSFERS: Assistive device utilized: None  Sit to stand: Complete Independence Stand to sit: Complete Independence   STAIRS:  Level of Assistance: SBA  Stair Negotiation Technique: Alternating Pattern  with No Rails  Number of Stairs: 4   Height of Stairs: 6  Comments: pt able to perform without UE support, but reports incr dizziness when descending.   GAIT: Gait pattern: step through pattern and wide BOS Distance walked: 115 Assistive device utilized: None Level of assistance: SBA Comments: mild unsteadiness with ambulation and veering to the R intermittently   FUNCTIONAL TESTs:   [facility] PT Assessment - 11/19/21 1001       Functional Gait  Assessment   Gait assessed  Yes     Gait Level Surface Walks 20 ft in less than 5.5 sec, no assistive devices, good speed, no evidence for imbalance, normal gait pattern, deviates no more than 6 in outside of the 12 in walkway width.    Change in Gait Speed Able to smoothly change walking speed without loss of balance or gait deviation. Deviate no more than 6 in outside of  the 12 in walkway width.    Gait with Horizontal Head Turns Performs head turns smoothly with slight change in gait velocity (eg, minor disruption to smooth gait path), deviates 6-10 in outside 12 in walkway width, or uses an assistive device.    Gait with Vertical Head Turns Performs task with slight change in gait velocity (eg, minor disruption to smooth gait path), deviates 6 - 10 in outside 12 in walkway width or uses assistive device    Gait and Pivot Turn Pivot turns safely in greater than 3 sec and stops with no loss of balance, or pivot turns safely within 3 sec and stops with mild imbalance, requires small steps to catch balance.   4/10 dizziness   Step Over Obstacle Is able to step over 2 stacked shoe boxes taped together (9 in total height) without changing gait speed. No evidence of imbalance.    Gait with Narrow Base of Support Is able to ambulate for 10 steps heel to toe with no staggering.    Gait with Eyes Closed Walks 20 ft, uses assistive device, slower speed, mild gait deviations, deviates 6-10 in outside 12 in walkway width. Ambulates 20 ft in less than 9 sec but greater than 7 sec.   8.06   Ambulating Backwards Walks 20 ft, no assistive devices, good speed, no evidence for imbalance, normal gait    Steps Alternating feet, no rail.   5-6/10 dizziness   Total Score 26    FGA comment: 26/30            mCTSIB:  Condition 1-4: 30 seconds Mild postural sway with conditions 2 and 4.   RLE: 20 seconds, LLE: 20 seconds (incr trunk lean to R) and more unsteadiness when standing on LLE, min guard needed for safety/balance.   TODAY'S TREATMENT:  N/A  during eval   PATIENT EDUCATION: Education details: POC, will update HEP at next session, discussed location of pt's CVA and how it relates to his deficits/dizziness, results of outcome measures at re-eval vs. When he was previously evaluated. Person educated: Patient Education method: Explanation Education comprehension: verbalized understanding   HOME EXERCISE PROGRAM: Access Code: [email or web address]    GOALS: Goals reviewed with patient? Yes  SHORT TERM GOALS: ALL STGS = LTGS    LONG TERM GOALS: Target date: 12/18/2021     Pt will be independent with final HEP for improved balance/coordination. Baseline:  Goal status: INITIAL  2.  Pt will improve FGA to >/= 29/30 to demonstrate improved balance and reduced fall risk Baseline: 26/30 Goal status: INITIAL  3.  Patient will be able to jog 50'  without imbalance  Baseline: pt starting to jog on treadmill Goal status: INITIAL  4.  Pt will improve FOTO to >/= 70% Baseline: 59% (when given at prior eval Goal status: INITIAL  5.  Pt will undergo further testing with SOT with LTG updated. Baseline: Not yet assessed.  Goal status: INITIAL   6.  MSQ to be assessed with goal written.  Baseline: Not yet assessed.  Goal status: INITIAL   ASSESSMENT:  CLINICAL IMPRESSION: Patient is a 51 y.o.male referred to Neuro OPPT services for Cerebellar CVA. Since pt was last seen here for therapy, pt presented to the hospital on 10/30/21 with sudden onset of tinnitus, vertigo and diplopia lasting for approximately 30-40 minutes-further evaluation showed a right cerebellar infarct. He was discharged on 11/01/2021 for outpatient follow-up.  Patient had right vertebral artery stenting on 09/07/1441, without complication. Pt returns to therapy  with new referral.  He was discharged on aspirin and Plavix. Patient's PMH significant for the following: BPH, hypogonadism, C1 Burst Fx . Since pt was last here, pt reports an improvement in his balance and reports  he has been learning to live with the dizziness. Upon evaluation, patient presents with the following impairments: abnormal gait, impaired balance, dizziness, decreased activity tolerance. Since pt was last here, pt improved his FGA score to a 26/30 (was 16/30), but pt is still at a low fall risk and pt able to hold condition 4 of mCTSIB to 30 seconds (previously was 6.4 seconds), but pt still with incr postural sway. Will perform SOT at next session for further assessment of balance and MSQ to further assess dizziness/motion sensitivities. Patient will benefit from skilled PT services to address impairments.   OBJECTIVE IMPAIRMENTS Abnormal gait, decreased activity tolerance, decreased balance, difficulty walking, decreased safety awareness, and dizziness.   ACTIVITY LIMITATIONS community activity, driving, occupation, and yard work.   PERSONAL FACTORS 1-2 comorbidities: 1-2 comorbidities: BPH, hypogonadism, C1 Burst Fx   are also affecting patient's functional outcome  are also affecting patient's functional outcome.    REHAB POTENTIAL: Excellent  CLINICAL DECISION MAKING: Stable/uncomplicated  EVALUATION COMPLEXITY: Low  PLAN: PT FREQUENCY: 2x/week  PT DURATION: 8 weeks (only anticipate 4 weeks)  PLANNED INTERVENTIONS: Therapeutic exercises, Therapeutic activity, Neuromuscular re-education, Balance training, Gait training, Patient/Family education, Joint mobilization, Stair training, Vestibular training, DME instructions, Aquatic Therapy, Electrical stimulation, Cryotherapy, Moist heat, and Manual therapy  PLAN FOR NEXT SESSION: Monitor BP. Perform SOT and write goal. Further vestibular assessment for motion sensitivities and write goal. Review prior HEP and update as needed.    Arliss Journey, PT, DPT  11/20/2021, 8:09 AM

## 2021-11-20 ENCOUNTER — Ambulatory Visit: Payer: BC Managed Care – PPO | Admitting: Physical Therapy

## 2021-11-23 ENCOUNTER — Ambulatory Visit: Payer: BC Managed Care – PPO | Admitting: Physical Therapy

## 2021-11-23 ENCOUNTER — Telehealth: Payer: Self-pay

## 2021-11-23 ENCOUNTER — Encounter: Payer: Self-pay | Admitting: Physical Therapy

## 2021-11-23 VITALS — BP 117/88 | HR 72

## 2021-11-23 DIAGNOSIS — R42 Dizziness and giddiness: Secondary | ICD-10-CM

## 2021-11-23 DIAGNOSIS — R2681 Unsteadiness on feet: Secondary | ICD-10-CM

## 2021-11-23 DIAGNOSIS — R2689 Other abnormalities of gait and mobility: Secondary | ICD-10-CM

## 2021-11-23 DIAGNOSIS — R262 Difficulty in walking, not elsewhere classified: Secondary | ICD-10-CM | POA: Diagnosis not present

## 2021-11-23 NOTE — Therapy (Signed)
OUTPATIENT PHYSICAL THERAPY NEURO TREATMENT   Patient Name: Christopher Pugh MRN: [id number] DOB:May 18, 1971, 51 y.o., male Today's Date: 11/23/2021  PCP: Chesley Noon, MD REFERRING PROVIDER: Pedro Earls, MD (pt being followed by Dr. Leta Baptist)   PT End of Session - 11/23/21 [place]     Visit Number 2    Number of Visits 9    Date for PT Re-Evaluation 01/19/22    Authorization Type BCBS    PT Start Time 0715    PT Stop Time 0757    PT Time Calculation (min) 42 min    Activity Tolerance Patient tolerated treatment well    Behavior During Therapy [facility] for tasks assessed/performed             Past Medical History:  Diagnosis Date   BPH without obstruction/lower urinary tract symptoms    Closed head injury with concussion    x 2   CVA (cerebral vascular accident) ([place]) 10/19/2021   DIzzy, off balance   Headache    Hypogonadism in male    Medical history non-contributory    Past Surgical History:  Procedure Laterality Date   ELBOW SURGERY Right 06/2021   IR ANGIO VERTEBRAL SEL VERTEBRAL BILAT MOD SED  11/01/2021   IR ANGIO VERTEBRAL SEL VERTEBRAL UNI R MOD SED  11/04/2021   IR ANGIOGRAM EXTREMITY RIGHT  11/04/2021   IR CT HEAD LTD  10/19/2021   IR INTRA CRAN STENT  11/04/2021   IR PERCUTANEOUS ART THROMBECTOMY/INFUSION INTRACRANIAL INC DIAG ANGIO  10/19/2021   IR US GUIDE VASC ACCESS RIGHT  10/19/2021   IR US GUIDE VASC ACCESS RIGHT  11/04/2021   RADIOLOGY WITH ANESTHESIA N/A 10/19/2021   Procedure: IR WITH ANESTHESIA;  Surgeon: Radiologist, Medication, MD;  Location: [place];  Service: Radiology;  Laterality: N/A;   RADIOLOGY WITH ANESTHESIA N/A 11/04/2021   Procedure: ANGIOPLASTY/STENTING;  Surgeon: Luanne Bras, MD;  Location: [place];  Service: Radiology;  Laterality: N/A;   Patient Active Problem List   Diagnosis Date Noted   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   TIA (transient ischemic attack) 10/30/2021   HLD (hyperlipidemia) 10/30/2021    Cerebrovascular accident (CVA) ([place]) 10/19/2021   BPH without obstruction/lower urinary tract symptoms 10/19/2021   Hypogonadism in male 10/19/2021   Alcohol abuse 10/19/2021   C1 cervical fracture ([place]) 10/19/2021    ONSET DATE: 11/05/2021 (date of referral)  REFERRING DIAG:  I63.211 (ICD-10-CM) - Cerebrovascular accident (CVA) due to stenosis of right vertebral artery ([place])    THERAPY DIAG:  Dizziness and giddiness  Unsteadiness on feet  Other abnormalities of gait and mobility  SUBJECTIVE:  SUBJECTIVE STATEMENT: Had a busy weekend. Did a lot. Was able to ride in the car ok. Just having some dizziness this morning.   Pt accompanied by: self  PERTINENT HISTORY: PERTINENT HISTORY: BPH, hypogonadism, C1 Burst Fx   51 y.o. 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  He was discharged on 11/01/2021 for outpatient follow-up.  Patient had right vertebral artery stenting on 12/09/3417, without complication.  He was discharged on aspirin and Plavix.    PAIN:  Are you having pain? Yes, 2-4/10 normal headache.   Vitals:   11/23/21 0719  BP: 117/88  Pulse: 72      PRECAUTIONS: Other: No driving.  No heavy lifting, no upside down.   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   OBJECTIVE:   SOT:  Conditions: 1: 3 trials WNL 2: 1 trial below normal 3:   2 trials below normal  4: 3 trials WNL 5: 2 trials below normal 6: 1 trial below normal Composite score: 68 (below normal)  Sensory Analysis Som: WNL Vis: WNL Vest: Below normal  Pref: WNL Strategy analysis: Equal hip/ankle strategy COG alignment: Pt with COG in center/slightly anterior.    GAIT: Gait pattern: step through pattern and wide BOS Distance walked: Clinic distances.  Assistive device utilized: None Level of assistance: SBA Comments: Veering to R at times.     TODAY'S TREATMENT:  Gaze Stabilization:  Horizontal VOR: bil stance on foam, completed x30 seconds, x60 seconds. Mild - mod dizziness with 60 seconds  Vertical VOR: bil stance on foam, completed x30 seconds, x60 seconds. Mild - mod dizziness with 60 seconds.  Cues for proper ROM. Discussed incr time to 60 seconds at home.    Access Code: [email or web address] URL: [email or web address] Date: 11/23/2021 Prepared by: Janann August  Exercises - Walking with Head Rotation  - 1 x daily - 7 x weekly - 1 sets - 4 reps - Standing on Foam with eyes Closed + Head Turns/Nods  - 1 x daily - 7 x weekly - 2 sets - 10 reps  Updated on 11/23/21:  - Romberg Stance Eyes Closed on Foam Pad  - 1 x daily - 5 x weekly - 3 sets - 30 hold -upgraded to feet together vs. Feet apart.   PATIENT EDUCATION: Education details: Results of SOT, progressing VOR x1 to 60 seconds, updates to HEP  Person educated: Patient Education method: Explanation and Handouts Education comprehension: verbalized understanding   HOME EXERCISE PROGRAM: Access Code: [email or web address]    GOALS: Goals reviewed with patient? Yes  SHORT TERM GOALS: ALL STGS = LTGS    LONG TERM GOALS: Target date: 12/18/2021     Pt will be independent with final HEP for improved balance/coordination. Baseline:  Goal status: INITIAL  2.  Pt will improve FGA to >/= 29/30 to demonstrate improved balance and reduced fall risk Baseline: 26/30 Goal status:  INITIAL  3.  Patient will be able to jog 50'  without imbalance  Baseline: pt starting to jog on treadmill Goal status: INITIAL  4.  Pt will improve FOTO to >/= 70% Baseline: 59% (when given at prior eval Goal status: INITIAL  5.  Pt will score at least an 80 on composite score of SOT and have vestibular score on sensory analysis be WNL in order to demo improved vestibular input for balance.  Baseline: Composite: 80, vestibular below normal limits.  Goal status: REVISED   6.  MSQ to be assessed with goal written.  Baseline: Not yet assessed.  Goal status: INITIAL   ASSESSMENT:  CLINICAL IMPRESSION: Assessed SOT today with pt's composite score being a 68 (below normal). Pt scoring below normal for vestibular on sensory analysis and above normal for somatosensory, vision, and pref. LTG updated as appropriate. Remainder of session focused on beginning to upgrade pt's HEP for VOR and EC balance. Pt tolerated session well, will continue to progress towards LTGs.   OBJECTIVE IMPAIRMENTS Abnormal gait, decreased activity tolerance, decreased balance, difficulty walking, decreased safety awareness, and dizziness.   ACTIVITY LIMITATIONS community activity, driving, occupation, and yard work.   PERSONAL FACTORS 1-2 comorbidities: 1-2 comorbidities: BPH, hypogonadism, C1 Burst Fx   are also affecting patient's functional outcome  are also affecting patient's functional outcome.    REHAB POTENTIAL: Excellent  CLINICAL DECISION MAKING: Stable/uncomplicated  EVALUATION COMPLEXITY: Low  PLAN: PT FREQUENCY: 2x/week  PT DURATION: 8 weeks (only anticipate 4 weeks)  PLANNED INTERVENTIONS: Therapeutic exercises, Therapeutic activity, Neuromuscular re-education, Balance training, Gait training, Patient/Family education, Joint mobilization, Stair training, Vestibular training, DME instructions, Aquatic Therapy, Electrical stimulation, Cryotherapy, Moist heat, and Manual therapy  PLAN FOR NEXT  SESSION: Further vestibular assessment for motion sensitivities and write goal. Continue to review prior HEP and update as needed. Balance for incr vestibular input.    Arliss Journey, PT, DPT  11/23/2021, 8:03 AM

## 2021-11-23 NOTE — Telephone Encounter (Signed)
The last OV notes mentions he being on low dose testosterone before and was stopped since events but doesn't mention if ok to restart or continue. Should he continue or FU with PCP regarding med? Please advise

## 2021-11-23 NOTE — Telephone Encounter (Signed)
In Dr. Gladstone Lighter last note:  patient asking about resuming testosterone replacement therapy; probably should wait 3-6 months post-stroke and then consider to restart --> will discuss with Dr Leonie Man and then let patient know final recommendation  I think he should wait for now and then Dr. Leta Baptist will reach out when he has a chance to discuss with Dr. Leonie Man

## 2021-11-23 NOTE — Telephone Encounter (Signed)
Would you have an idea on if/when pt could restart low dose testosterone?  (Sent to Dr Billey Gosling in error)

## 2021-11-23 NOTE — Telephone Encounter (Signed)
Patient left a voicemail at 8:21 AM asking for a call to discuss a medication that he was supposed to stop taking after his stroke.  He would like a call back at [phone].

## 2021-11-25 ENCOUNTER — Ambulatory Visit: Payer: BC Managed Care – PPO

## 2021-11-25 VITALS — BP 137/98 | HR 74

## 2021-11-25 DIAGNOSIS — R262 Difficulty in walking, not elsewhere classified: Secondary | ICD-10-CM | POA: Diagnosis not present

## 2021-11-25 DIAGNOSIS — R2689 Other abnormalities of gait and mobility: Secondary | ICD-10-CM

## 2021-11-25 DIAGNOSIS — R42 Dizziness and giddiness: Secondary | ICD-10-CM

## 2021-11-25 DIAGNOSIS — R2681 Unsteadiness on feet: Secondary | ICD-10-CM

## 2021-11-25 NOTE — Therapy (Signed)
OUTPATIENT PHYSICAL THERAPY NEURO TREATMENT   Patient Name: Christopher Pugh MRN: [id number] DOB:Nov 14, 1970, 51 y.o., male Today's Date: 11/25/2021  PCP: Chesley Noon, MD REFERRING PROVIDER: Pedro Earls, MD (pt being followed by Dr. Leta Baptist)   PT End of Session - 11/25/21 [place]     Visit Number 3    Number of Visits 9    Date for PT Re-Evaluation 01/19/22    Authorization Type BCBS    PT Start Time 0715    PT Stop Time 0759    PT Time Calculation (min) 44 min    Activity Tolerance Patient tolerated treatment well    Behavior During Therapy [facility] for tasks assessed/performed              Past Medical History:  Diagnosis Date   BPH without obstruction/lower urinary tract symptoms    Closed head injury with concussion    x 2   CVA (cerebral vascular accident) ([place]) 10/19/2021   DIzzy, off balance   Headache    Hypogonadism in male    Medical history non-contributory    Past Surgical History:  Procedure Laterality Date   ELBOW SURGERY Right 06/2021   IR ANGIO VERTEBRAL SEL VERTEBRAL BILAT MOD SED  11/01/2021   IR ANGIO VERTEBRAL SEL VERTEBRAL UNI R MOD SED  11/04/2021   IR ANGIOGRAM EXTREMITY RIGHT  11/04/2021   IR CT HEAD LTD  10/19/2021   IR INTRA CRAN STENT  11/04/2021   IR PERCUTANEOUS ART THROMBECTOMY/INFUSION INTRACRANIAL INC DIAG ANGIO  10/19/2021   IR US GUIDE VASC ACCESS RIGHT  10/19/2021   IR US GUIDE VASC ACCESS RIGHT  11/04/2021   RADIOLOGY WITH ANESTHESIA N/A 10/19/2021   Procedure: IR WITH ANESTHESIA;  Surgeon: Radiologist, Medication, MD;  Location: [place];  Service: Radiology;  Laterality: N/A;   RADIOLOGY WITH ANESTHESIA N/A 11/04/2021   Procedure: ANGIOPLASTY/STENTING;  Surgeon: Luanne Bras, MD;  Location: [place];  Service: Radiology;  Laterality: N/A;   Patient Active Problem List   Diagnosis Date Noted   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   TIA (transient ischemic attack) 10/30/2021   HLD (hyperlipidemia) 10/30/2021    Cerebrovascular accident (CVA) ([place]) 10/19/2021   BPH without obstruction/lower urinary tract symptoms 10/19/2021   Hypogonadism in male 10/19/2021   Alcohol abuse 10/19/2021   C1 cervical fracture ([place]) 10/19/2021    ONSET DATE: 11/05/2021 (date of referral)  REFERRING DIAG:  I63.211 (ICD-10-CM) - Cerebrovascular accident (CVA) due to stenosis of right vertebral artery ([place])    THERAPY DIAG:  Dizziness and giddiness  Unsteadiness on feet  Other abnormalities of gait and mobility  Difficulty in walking, not elsewhere classified  SUBJECTIVE:  SUBJECTIVE STATEMENT: Reports he felt awful after the SOT. Reports overall feels the same. Reports he has had some spinning rolling from his L side to the R side. Reports he is just staying dizzy.   Pt accompanied by: self  PERTINENT HISTORY: PERTINENT HISTORY: BPH, hypogonadism, C1 Burst Fx   51 y.o. 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  He was discharged on 11/01/2021 for outpatient follow-up.  Patient had right vertebral artery stenting on 02/07/276, without complication.  He was discharged on aspirin and Plavix.    PAIN:  Are you having pain? Yes, 2-4/10 normal headache.   Vitals:   11/25/21 0723  BP: (!) 137/98  Pulse: 74     PRECAUTIONS: Other: No driving.  No heavy lifting, no upside down.   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    OBJECTIVE:   Motion Sensitivity Quotient  Intensity: 0 = none, 1 = Lightheaded, 2 = Mild, 3 = Moderate, 4 = Severe, 5 = Vomiting  Intensity  1. Sitting to supine 1  2. Supine to L side 1  3. Supine to R side 2  4. Supine to sitting 2  5. L Hallpike-Dix Deferred (completed sidelying: 1  6. Up from L  Deferred (completed sidelying: 1  7. R Hallpike-Dix Deferred (completed sidelying: 2)   8. Up from R  Deferred (completed sidelying: 2)   9. Sitting, head  tipped to L knee 2  10. Head up from L  knee 3  11. Sitting, head  tipped to R knee 2  12. Head up from R  knee 3  13. Sitting head turns x5 1  14.Sitting head nods x5  2  15. In stance, 180  turn to L  1  16. In stance, 180  turn to R 2    Positional Testing:  R Roll Test: with first trial had geoptrophic nystagmus lasting approx 5-10 seconds. Unable to elicit with additional testing. Mod symptoms  L Roll Test: Very mild/faint geotrophic nystagmus (3 seconds).  Withheld from positional maneuver due to unable to elicit with further testings, will continue to assess at future sessions.  L Sidelying: Negative R Sidelying: Negative  GAIT: Gait pattern: step through pattern and wide BOS Distance walked: clinic distance with activities Assistive device utilized: None Level of assistance: SBA Comments: intermittent veering noted to R  TODAY'S TREATMENT:  Habituation: Bending to Knee: Completed x 5 Reps to Bilat Directions. Mild-Mod Dizziness reported. PT educating on proper completion and added to HEP.  Withheld Habituation to R Rolling at this time due nystagmus noted with positional testing.   NMR:  Standing Balance: Surface: Airex Position: Narrow Base of Support Completed with: Eyes Closed; 3 x 30 seconds Tandem Stance:  Surface: Airex Position: Narrow Base of Support Completed with: Eyes Open x 30 seconds, then with Eyes Closed;  x 30 seconds   PATIENT  EDUCATION: Education details: Updated HEP; MSQ Results Person educated: Patient Education method: Explanation and Handouts Education comprehension: verbalized understanding   HOME EXERCISE PROGRAM: Access Code: [email or web address]    GOALS: Goals reviewed with patient? Yes  SHORT TERM GOALS: ALL STGS =  LTGS    LONG TERM GOALS: Target date: 12/18/2021     Pt will be independent with final HEP for improved balance/coordination. Baseline:  Goal status: INITIAL  2.  Pt will improve FGA to >/= 29/30 to demonstrate improved balance and reduced fall risk Baseline: 26/30 Goal status: INITIAL  3.  Patient will be able to jog 50'  without imbalance  Baseline: pt starting to jog on treadmill Goal status: INITIAL  4.  Pt will improve FOTO to >/= 70% Baseline: 59% (when given at prior eval Goal status: INITIAL  5.  Pt will score at least an 80 on composite score of SOT and have vestibular score on sensory analysis be WNL in order to demo improved vestibular input for balance.  Baseline: Composite: 80, vestibular below normal limits.  Goal status: REVISED   6.  MSQ to be assessed with goal written.  Baseline: Not yet assessed.  Goal status: INITIAL   ASSESSMENT:  CLINICAL IMPRESSION: Completed assessment of MSQ, with patient demonstrating mild-mod motion sensitivity most noted with rolling to R and bending/returning to upright position. Assessed sidelying test bilat with negative results, however with roll test geoptrophic nystagmus noted of short duration. However unable to elicit after first assessment. Will continue to assess and treat as indicated. Updated HEP to begin to focus on habituation to motion sensitivities.   OBJECTIVE IMPAIRMENTS Abnormal gait, decreased activity tolerance, decreased balance, difficulty walking, decreased safety awareness, and dizziness.   ACTIVITY LIMITATIONS community activity, driving, occupation, and yard work.   PERSONAL FACTORS 1-2 comorbidities:  1-2 comorbidities: BPH, hypogonadism, C1 Burst Fx   are also affecting patient's functional outcome  are also affecting patient's functional outcome.    REHAB POTENTIAL: Excellent  CLINICAL DECISION MAKING: Stable/uncomplicated  EVALUATION COMPLEXITY: Low  PLAN: PT FREQUENCY: 2x/week  PT DURATION: 8 weeks (only anticipate 4 weeks)  PLANNED INTERVENTIONS: Therapeutic exercises, Therapeutic activity, Neuromuscular re-education, Balance training, Gait training, Patient/Family education, Joint mobilization, Stair training, Vestibular training, DME instructions, Aquatic Therapy, Electrical stimulation, Cryotherapy, Moist heat, and Manual therapy  PLAN FOR NEXT SESSION: Monitor BP. Reassess Roll Test. Habituation. Continue to review prior HEP and update as needed. Balance for incr vestibular input.    Jones Bales, PT, DPT  11/25/2021, 8:24 AM

## 2021-12-01 ENCOUNTER — Ambulatory Visit: Payer: BC Managed Care – PPO

## 2021-12-01 VITALS — BP 127/93 | HR 72

## 2021-12-01 DIAGNOSIS — R2681 Unsteadiness on feet: Secondary | ICD-10-CM

## 2021-12-01 DIAGNOSIS — R2689 Other abnormalities of gait and mobility: Secondary | ICD-10-CM

## 2021-12-01 DIAGNOSIS — R262 Difficulty in walking, not elsewhere classified: Secondary | ICD-10-CM | POA: Diagnosis not present

## 2021-12-01 DIAGNOSIS — R42 Dizziness and giddiness: Secondary | ICD-10-CM

## 2021-12-01 NOTE — Therapy (Signed)
OUTPATIENT PHYSICAL THERAPY NEURO TREATMENT   Patient Name: Christopher Pugh MRN: [id number] DOB:05-27-1971, 51 y.o., male Today's Date: 12/01/2021  PCP: Chesley Noon, MD REFERRING PROVIDER: Pedro Earls, MD (pt being followed by Dr. Leta Baptist)   PT End of Session - 12/01/21 0716     Visit Number 4    Number of Visits 9    Date for PT Re-Evaluation 01/19/22    Authorization Type BCBS    PT Start Time 0716    PT Stop Time 0758    PT Time Calculation (min) 42 min    Activity Tolerance Patient tolerated treatment well    Behavior During Therapy [facility] for tasks assessed/performed              Past Medical History:  Diagnosis Date   BPH without obstruction/lower urinary tract symptoms    Closed head injury with concussion    x 2   CVA (cerebral vascular accident) ([place]) 10/19/2021   DIzzy, off balance   Headache    Hypogonadism in male    Medical history non-contributory    Past Surgical History:  Procedure Laterality Date   ELBOW SURGERY Right 06/2021   IR ANGIO VERTEBRAL SEL VERTEBRAL BILAT MOD SED  11/01/2021   IR ANGIO VERTEBRAL SEL VERTEBRAL UNI R MOD SED  11/04/2021   IR ANGIOGRAM EXTREMITY RIGHT  11/04/2021   IR CT HEAD LTD  10/19/2021   IR INTRA CRAN STENT  11/04/2021   IR PERCUTANEOUS ART THROMBECTOMY/INFUSION INTRACRANIAL INC DIAG ANGIO  10/19/2021   IR US GUIDE VASC ACCESS RIGHT  10/19/2021   IR US GUIDE VASC ACCESS RIGHT  11/04/2021   RADIOLOGY WITH ANESTHESIA N/A 10/19/2021   Procedure: IR WITH ANESTHESIA;  Surgeon: Radiologist, Medication, MD;  Location: [place];  Service: Radiology;  Laterality: N/A;   RADIOLOGY WITH ANESTHESIA N/A 11/04/2021   Procedure: ANGIOPLASTY/STENTING;  Surgeon: Luanne Bras, MD;  Location: [place];  Service: Radiology;  Laterality: N/A;   Patient Active Problem List   Diagnosis Date Noted   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   TIA (transient ischemic attack) 10/30/2021   HLD (hyperlipidemia) 10/30/2021    Cerebrovascular accident (CVA) ([place]) 10/19/2021   BPH without obstruction/lower urinary tract symptoms 10/19/2021   Hypogonadism in male 10/19/2021   Alcohol abuse 10/19/2021   C1 cervical fracture ([place]) 10/19/2021    ONSET DATE: 11/05/2021 (date of referral)  REFERRING DIAG:  I63.211 (ICD-10-CM) - Cerebrovascular accident (CVA) due to stenosis of right vertebral artery ([place])    THERAPY DIAG:  Dizziness and giddiness  Unsteadiness on feet  Other abnormalities of gait and mobility  Difficulty in walking, not elsewhere classified  SUBJECTIVE:  SUBJECTIVE STATEMENT: Patient reports that he feels like the balance part is getting back. Reports still having the dizziness. Reports he is working out, reports 1 min walk and then 3 minute jogging. Notices he veers. Reports spinning sensation has continued, reports he has not had any severe issues at home. But feels like he may have avoided rolling around. Reports he is having an increase in anxiousness due to symptoms.   Pt accompanied by: self  PERTINENT HISTORY: PERTINENT HISTORY: BPH, hypogonadism, C1 Burst Fx   51 y.o. 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  He was discharged on 11/01/2021 for outpatient follow-up.  Patient had right vertebral artery stenting on 10/03/6604, without complication.  He was discharged on aspirin and Plavix.    PAIN:  Are you having pain? Yes, 2-3/10 normal headache.   Vitals:   12/01/21 0723   BP: (!) 127/93  Pulse: 72      PRECAUTIONS: Other: No driving.  No heavy lifting, no upside down.   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    OBJECTIVE:  Positional Testing:  R Roll Test: Negative. Retested with [place]zel. Mild R Geotrophic Nystagmus (very low intensity) noted with return to supine after testing. Will continue to assess. Patient asymptomatic.   L Roll Test: Negative. Retested with Frenzel. No Nystagmus noted with L Roll Test  GAIT: Gait pattern: step through pattern and wide BOS Distance walked: clinic distance with activities Assistive device utilized: None Level of assistance: SBA Comments: intermittent veering noted to R  NMR:  Standing Balance: Surface: on inverted BOSU Position: Feet Hip Width Apart Completed with: Eyes Closed; 3 x 30 seconds, Eyes Open with Horizontal/Vertical x 10 reps each. Mild Dizziness. Patient reporting and demo saccadic eye movement with vertical head turns with PT providing education on compensatory saccades to reduce dizziness severity. Improvements reported.   Tandem Stance:  Surface: Airex Position: Narrow Base of Support Completed with: Eyes Open x 30 seconds, then with Head Turns/Nods x 10. Mild-Mod Dizziness.  Obstacle Negotiation: Completed ambulation over floor pebbles x 2 laps down and back with them close together, then added in distance between pebbles for further challenge x 3 laps down and back. CGA. 2-3 second hold on pebble for increased challenge with SLS. BP after completion: 122/91, HR: 59  PATIENT EDUCATION: Education details: Continue HEP Person educated: Patient Education method: Explanation and Handouts Education comprehension: verbalized understanding   HOME EXERCISE PROGRAM: Access Code: [email or web address]    GOALS: Goals reviewed with patient? Yes  SHORT TERM GOALS: ALL STGS =  LTGS    LONG TERM GOALS: Target date: 12/18/2021     Pt will be independent with final HEP for improved balance/coordination. Baseline:  Goal status: INITIAL  2.  Pt will improve FGA to >/= 29/30 to demonstrate improved balance and reduced fall risk Baseline: 26/30 Goal status: INITIAL  3.  Patient will be able to jog 50'  without imbalance  Baseline: pt starting to jog on treadmill Goal status: INITIAL  4.  Pt will improve FOTO to >/= 70% Baseline: 59% (when given at prior eval Goal status: INITIAL  5.  Pt will  score at least an 80 on composite score of SOT and have vestibular score on sensory analysis be WNL in order to demo improved vestibular input for balance.  Baseline: Composite: 80, vestibular below normal limits.  Goal status: REVISED   6.  MSQ to be assessed with goal written.  Baseline: Not yet assessed.  Goal status: INITIAL   ASSESSMENT:  CLINICAL IMPRESSION: Reassessed positional testing with patient only having a very mild Geotrophic nystagmus with return to supine but patient asymptomatic, will continue to monitor. Rest of session focused on continued high level balance on complaint surface and SLS activities.   OBJECTIVE IMPAIRMENTS Abnormal gait, decreased activity tolerance, decreased balance, difficulty walking, decreased safety awareness, and dizziness.   ACTIVITY LIMITATIONS community activity, driving, occupation, and yard work.   PERSONAL FACTORS 1-2 comorbidities: 1-2 comorbidities: BPH, hypogonadism, C1 Burst Fx   are also affecting patient's functional outcome  are also affecting patient's functional outcome.    REHAB POTENTIAL: Excellent  CLINICAL DECISION MAKING: Stable/uncomplicated  EVALUATION COMPLEXITY: Low  PLAN: PT FREQUENCY: 2x/week  PT DURATION: 8 weeks (only anticipate 4 weeks)  PLANNED INTERVENTIONS: Therapeutic exercises, Therapeutic activity, Neuromuscular re-education, Balance training, Gait training, Patient/Family  education, Joint mobilization, Stair training, Vestibular training, DME instructions, Aquatic Therapy, Electrical stimulation, Cryotherapy, Moist heat, and Manual therapy  PLAN FOR NEXT SESSION: Monitor BP. Habituation. Continue to review prior HEP and update as needed. Balance for incr vestibular input.    Jones Bales, PT, DPT  12/01/2021, 8:12 AM

## 2021-12-03 ENCOUNTER — Ambulatory Visit: Payer: BC Managed Care – PPO | Attending: Neuroradiology

## 2021-12-03 VITALS — BP 127/99 | HR 74

## 2021-12-03 DIAGNOSIS — R42 Dizziness and giddiness: Secondary | ICD-10-CM | POA: Diagnosis present

## 2021-12-03 DIAGNOSIS — R262 Difficulty in walking, not elsewhere classified: Secondary | ICD-10-CM

## 2021-12-03 DIAGNOSIS — R2681 Unsteadiness on feet: Secondary | ICD-10-CM

## 2021-12-03 DIAGNOSIS — R2689 Other abnormalities of gait and mobility: Secondary | ICD-10-CM | POA: Diagnosis present

## 2021-12-03 NOTE — Therapy (Signed)
OUTPATIENT PHYSICAL THERAPY NEURO TREATMENT   Patient Name: Christopher Pugh MRN: [id number] DOB:[birth year], 51 y.o., male Today's Date: 12/03/2021  PCP: Chesley Noon, MD REFERRING PROVIDER: Pedro Earls, MD (pt being followed by Dr. Leta Baptist)   PT End of Session - 12/03/21 0709     Visit Number 5    Number of Visits 9    Date for PT Re-Evaluation 01/19/22    Authorization Type BCBS    PT Start Time 0715    PT Stop Time 0800    PT Time Calculation (min) 45 min    Activity Tolerance Patient tolerated treatment well    Behavior During Therapy [facility] for tasks assessed/performed              Past Medical History:  Diagnosis Date   BPH without obstruction/lower urinary tract symptoms    Closed head injury with concussion    x 2   CVA (cerebral vascular accident) ([place]) 10/19/2021   DIzzy, off balance   Headache    Hypogonadism in male    Medical history non-contributory    Past Surgical History:  Procedure Laterality Date   ELBOW SURGERY Right 06/2021   IR ANGIO VERTEBRAL SEL VERTEBRAL BILAT MOD SED  11/01/2021   IR ANGIO VERTEBRAL SEL VERTEBRAL UNI R MOD SED  11/04/2021   IR ANGIOGRAM EXTREMITY RIGHT  11/04/2021   IR CT HEAD LTD  10/19/2021   IR INTRA CRAN STENT  11/04/2021   IR PERCUTANEOUS ART THROMBECTOMY/INFUSION INTRACRANIAL INC DIAG ANGIO  10/19/2021   IR US GUIDE VASC ACCESS RIGHT  10/19/2021   IR US GUIDE VASC ACCESS RIGHT  11/04/2021   RADIOLOGY WITH ANESTHESIA N/A 10/19/2021   Procedure: IR WITH ANESTHESIA;  Surgeon: Radiologist, Medication, MD;  Location: [place];  Service: Radiology;  Laterality: N/A;   RADIOLOGY WITH ANESTHESIA N/A 11/04/2021   Procedure: ANGIOPLASTY/STENTING;  Surgeon: Luanne Bras, MD;  Location: [place];  Service: Radiology;  Laterality: N/A;   Patient Active Problem List   Diagnosis Date Noted   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   TIA (transient ischemic attack) 10/30/2021   HLD (hyperlipidemia) 10/30/2021    Cerebrovascular accident (CVA) ([place]) 10/19/2021   BPH without obstruction/lower urinary tract symptoms 10/19/2021   Hypogonadism in male 10/19/2021   Alcohol abuse 10/19/2021   C1 cervical fracture ([place]) 10/19/2021    ONSET DATE: 11/05/2021 (date of referral)  REFERRING DIAG:  I63.211 (ICD-10-CM) - Cerebrovascular accident (CVA) due to stenosis of right vertebral artery ([place])    THERAPY DIAG:  Dizziness and giddiness  Unsteadiness on feet  Other abnormalities of gait and mobility  Difficulty in walking, not elsewhere classified  SUBJECTIVE:  SUBJECTIVE STATEMENT: Patient reports still frustrated with everything. 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 No other new changes/complaints.   Pt accompanied by: self  PERTINENT HISTORY: PERTINENT HISTORY: BPH, hypogonadism, C1 Burst Fx   51 y.o. 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  He was discharged on 11/01/2021 for outpatient follow-up.  Patient had right vertebral artery stenting on 07/10/1094, without complication.  He was discharged on aspirin and Plavix.    PAIN:  Are you having pain? Yes, 3/10 normal headache.   Vitals:   12/03/21 0724 12/03/21 0739  BP: (!) 129/95 (!) 127/99  Pulse: (!) 55 74    PRECAUTIONS: Other: No driving.  No heavy lifting, no upside down.   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    OBJECTIVE:  GAIT: Gait pattern: step through pattern and wide BOS Distance walked: clinic distance with activities Assistive device utilized: None Level of assistance: SBA Comments: intermittent veering noted to R     THEREX:  Completed ambulation and training on treadmill, completed warm up on 2.0 mph x 2 minutes. Then completed jog/run on 4.8 mph x 3 minutes, then returned to 3.0 mph x 2 minutes. Then completed additional jog/run on 5.2 mph x 3 minutes. Then completed cool down on 3.0 mph x 2 minutes. Pt reporting increase in dizziness, continues to climb the longer he is jogging. Intermittent veering noted with jog mainly to the R > L, intermittent touch to handles a needed.   NMR:  Standing Balance: Surface: on inverted BOSU Position: Feet Hip Width Apart Completed with: Eyes Closed; 3 x 30 seconds, Eyes Open with Horizontal/Vertical x 10 reps each. Mild Dizziness.   Completed visual tracking with ambulation: completed ball toss 4 x 45' with visual tracking vertically, no veering noted. Mild dizziness. Progressed to CW/CCW circles, 4 x 45' each direction, more veering noted to R > L. Mod Dizziness but improvements with standing rest break and focal point.   Completed alternating walking lunges forward with 3# weighted ball with trunk/head rotations to R/L 2 x 45'. Mild-Mod Dizziness.   PATIENT EDUCATION: Education details: Continue HEP Person educated: Patient Education method: Theatre stage manager Education comprehension: verbalized understanding   HOME EXERCISE PROGRAM: Access Code: [email or web address]    GOALS: Goals reviewed with patient? Yes  SHORT TERM GOALS: ALL STGS = LTGS    LONG TERM GOALS: Target date: 12/18/2021     Pt will be independent with final HEP for improved balance/coordination. Baseline:  Goal status: INITIAL  2.  Pt will improve FGA to >/= 29/30 to demonstrate improved  balance and reduced fall risk Baseline: 26/30 Goal status: INITIAL  3.  Patient will be able to jog 50'  without imbalance  Baseline: pt starting to jog on treadmill Goal status: INITIAL  4.  Pt will improve FOTO to >/= 70% Baseline: 59% (when given at prior eval Goal status: INITIAL  5.  Pt will score at least an 80 on composite score of SOT and have vestibular score on sensory analysis be WNL in order to demo improved vestibular  input for balance.  Baseline: Composite: 80, vestibular below normal limits.  Goal status: REVISED   6.  MSQ to be assessed with goal written.  Baseline: Not yet assessed.  Goal status: INITIAL   ASSESSMENT:  CLINICAL IMPRESSION: Completed ambulation and jogging on treadmill today, with patient tolerating well. Intermittent veering noted to R side but minimal. Patient continues to have intermittent increases with activities and visual tracking tasks. Will continue per POC.   OBJECTIVE IMPAIRMENTS Abnormal gait, decreased activity tolerance, decreased balance, difficulty walking, decreased safety awareness, and dizziness.   ACTIVITY LIMITATIONS community activity, driving, occupation, and yard work.   PERSONAL FACTORS 1-2 comorbidities: 1-2 comorbidities: BPH, hypogonadism, C1 Burst Fx   are also affecting patient's functional outcome  are also affecting patient's functional outcome.    REHAB POTENTIAL: Excellent  CLINICAL DECISION MAKING: Stable/uncomplicated  EVALUATION COMPLEXITY: Low  PLAN: PT FREQUENCY: 2x/week  PT DURATION: 8 weeks (only anticipate 4 weeks)  PLANNED INTERVENTIONS: Therapeutic exercises, Therapeutic activity, Neuromuscular re-education, Balance training, Gait training, Patient/Family education, Joint mobilization, Stair training, Vestibular training, DME instructions, Aquatic Therapy, Electrical stimulation, Cryotherapy, Moist heat, and Manual therapy  PLAN FOR NEXT SESSION: Will need to check LTGs + Update. Monitor BP.  Habituation. Continue to review prior HEP and update as needed. Balance for incr vestibular input.    Jones Bales, PT, DPT  12/03/2021, 8:06 AM

## 2021-12-07 ENCOUNTER — Ambulatory Visit: Payer: BC Managed Care – PPO

## 2021-12-07 VITALS — BP 128/95 | HR 66

## 2021-12-07 DIAGNOSIS — R262 Difficulty in walking, not elsewhere classified: Secondary | ICD-10-CM

## 2021-12-07 DIAGNOSIS — R42 Dizziness and giddiness: Secondary | ICD-10-CM

## 2021-12-07 DIAGNOSIS — R2681 Unsteadiness on feet: Secondary | ICD-10-CM

## 2021-12-07 DIAGNOSIS — R2689 Other abnormalities of gait and mobility: Secondary | ICD-10-CM

## 2021-12-07 NOTE — Therapy (Signed)
OUTPATIENT PHYSICAL THERAPY NEURO TREATMENT   Patient Name: Christopher Pugh MRN: [id number] DOB:[birth year], 51 y.o., male Today's Date: 12/07/2021  PCP: Chesley Noon, MD REFERRING PROVIDER: Pedro Earls, MD (pt being followed by Dr. Leta Baptist)   PT End of Session - 12/07/21 0714     Visit Number 6    Number of Visits 9    Date for PT Re-Evaluation 01/19/22    Authorization Type BCBS    PT Start Time 0715    PT Stop Time 0759    PT Time Calculation (min) 44 min    Activity Tolerance Patient tolerated treatment well    Behavior During Therapy [facility] for tasks assessed/performed              Past Medical History:  Diagnosis Date   BPH without obstruction/lower urinary tract symptoms    Closed head injury with concussion    x 2   CVA (cerebral vascular accident) ([place]) 10/19/2021   DIzzy, off balance   Headache    Hypogonadism in male    Medical history non-contributory    Past Surgical History:  Procedure Laterality Date   ELBOW SURGERY Right 06/2021   IR ANGIO VERTEBRAL SEL VERTEBRAL BILAT MOD SED  11/01/2021   IR ANGIO VERTEBRAL SEL VERTEBRAL UNI R MOD SED  11/04/2021   IR ANGIOGRAM EXTREMITY RIGHT  11/04/2021   IR CT HEAD LTD  10/19/2021   IR INTRA CRAN STENT  11/04/2021   IR PERCUTANEOUS ART THROMBECTOMY/INFUSION INTRACRANIAL INC DIAG ANGIO  10/19/2021   IR US GUIDE VASC ACCESS RIGHT  10/19/2021   IR US GUIDE VASC ACCESS RIGHT  11/04/2021   RADIOLOGY WITH ANESTHESIA N/A 10/19/2021   Procedure: IR WITH ANESTHESIA;  Surgeon: Radiologist, Medication, MD;  Location: [place];  Service: Radiology;  Laterality: N/A;   RADIOLOGY WITH ANESTHESIA N/A 11/04/2021   Procedure: ANGIOPLASTY/STENTING;  Surgeon: Luanne Bras, MD;  Location: [place];  Service: Radiology;  Laterality: N/A;   Patient Active Problem List   Diagnosis Date Noted   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   TIA (transient ischemic attack) 10/30/2021   HLD (hyperlipidemia) 10/30/2021    Cerebrovascular accident (CVA) ([place]) 10/19/2021   BPH without obstruction/lower urinary tract symptoms 10/19/2021   Hypogonadism in male 10/19/2021   Alcohol abuse 10/19/2021   C1 cervical fracture ([place]) 10/19/2021    ONSET DATE: 11/05/2021 (date of referral)  REFERRING DIAG:  I63.211 (ICD-10-CM) - Cerebrovascular accident (CVA) due to stenosis of right vertebral artery ([place])   THERAPY DIAG:  Dizziness and giddiness  Unsteadiness on feet  Other abnormalities of gait and mobility  Difficulty in walking, not elsewhere classified  SUBJECTIVE:  SUBJECTIVE STATEMENT: Patient reports went on boat, and really didn't notice the dizziness on the boat. But really felt it when he got off the boat. Reports was very dizzy yesterday. No other new changes. Did have tinnitus yesterday.   Pt accompanied by: self  PERTINENT HISTORY: PERTINENT HISTORY: BPH, hypogonadism, C1 Burst Fx   51 y.o. 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  He was discharged on 11/01/2021 for outpatient follow-up.  Patient had right vertebral artery stenting on 08/13/5282, without complication.  He was discharged on aspirin and Plavix.    PAIN:  Are you having pain? Yes, 3/10 normal headache.   Vitals:   12/07/21 0721  BP: (!) 128/95  Pulse: 66     PRECAUTIONS: Other: No driving.  No heavy lifting, no upside down.   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    OBJECTIVE:  GAIT: Gait pattern: step through pattern and wide BOS Distance walked: clinic distance with activities Assistive device utilized: None Level of assistance: SBA   NMR:  Standing Balance: Surface: on blue mat on incline Position: Feet Hip Width Apart Completed with: Eyes Closed; 3 x 30 seconds. Then eyes closed and horizontal/vertical head turns x 10 reps each direction. Then completed static marching with EO x 30 seconds, then completed horizontal head turns x 5 reps with EO then EC, then vertical head turns x 5 reps with EO then EC. Then completed EC 2 x 30 seconds without head movement. Most challenge noted with vertical head movement and eyes closed. CGA throughout.   Completed gentle bouncing on physioball with visual target to stimulate otolith organs, completed 2 x 30 seconds with intermittent increase in dizziness up to 5-6/10 requiring rest break. Then trialed adding in small range horizontal/vertical head turns x 5 reps each, with small pause in midline. More symptoms noted with head turn to R > L, and Up > Down. Then trialed started gentle bouncing with eyes focused on target, transitioning to eyes closed, 2 x 30 seconds with Mod Dizziness. Supervision throughout.   Completed quick direction changes, jogging, and backwards jogging to cone. PT calling out cone and changing direction to reach target, completed. Completed 2 x 10 directions, last rep completed faster pace of changes and x 15 reps. Intermittent standing rest break required. Increase in dizziness (moderate), and mild increase in dizziness. BP after activity: 140/90.    PATIENT EDUCATION: Education details: Continue HEP; Plan to extend out for 4 more visits, will check LTGs next visit Person educated: Patient Education method: Explanation and Handouts Education comprehension: verbalized understanding   HOME EXERCISE PROGRAM: Access  Code: [email or web address]    GOALS: Goals reviewed with patient? Yes  SHORT TERM GOALS: ALL STGS = LTGS    LONG TERM GOALS: Target date: 12/18/2021     Pt will be independent with final HEP for improved balance/coordination. Baseline:  Goal status: INITIAL  2.  Pt will improve FGA to >/= 29/30 to demonstrate improved balance and reduced fall risk Baseline: 26/30 Goal status: INITIAL  3.  Patient will be able to jog 50'  without imbalance  Baseline: pt starting to jog on  treadmill Goal status: INITIAL  4.  Pt will improve FOTO to >/= 70% Baseline: 59% (when given at prior eval Goal status: INITIAL  5.  Pt will score at least an 80 on composite score of SOT and have vestibular score on sensory analysis be WNL in order to demo improved vestibular input for balance.  Baseline: Composite: 80, vestibular below normal limits.  Goal status: REVISED   6.  MSQ to be assessed with goal written.  Baseline: Not yet assessed.  Goal status: INITIAL   ASSESSMENT:  CLINICAL IMPRESSION: Today's skilled PT session focused on continued otolith organ function and dynamic balance with quick direction changes. Pt tolerating well with mod dizziness requiring intermittent rest breaks. Will continue per POC.   OBJECTIVE IMPAIRMENTS Abnormal gait, decreased activity tolerance, decreased balance, difficulty walking, decreased safety awareness, and dizziness.   ACTIVITY LIMITATIONS community activity, driving, occupation, and yard work.   PERSONAL FACTORS 1-2 comorbidities: 1-2 comorbidities: BPH, hypogonadism, C1 Burst Fx   are also affecting patient's functional outcome  are also affecting patient's functional outcome.    REHAB POTENTIAL: Excellent  CLINICAL DECISION MAKING: Stable/uncomplicated  EVALUATION COMPLEXITY: Low  PLAN: PT FREQUENCY: 2x/week  PT DURATION: 8 weeks (only anticipate 4 weeks)  PLANNED INTERVENTIONS: Therapeutic exercises, Therapeutic activity, Neuromuscular re-education,  Balance training, Gait training, Patient/Family education, Joint mobilization, Stair training, Vestibular training, DME instructions, Aquatic Therapy, Electrical stimulation, Cryotherapy, Moist heat, and Manual therapy  PLAN FOR NEXT SESSION: Will need to check LTGs + Update Next Visit. Monitor BP. Habituation. Continue to review prior HEP and update as needed. Balance for incr vestibular input.    Jones Bales, PT, DPT  12/07/2021, 8:32 AM

## 2021-12-08 ENCOUNTER — Ambulatory Visit: Payer: BC Managed Care – PPO | Admitting: Cardiovascular Disease

## 2021-12-09 ENCOUNTER — Ambulatory Visit: Payer: BC Managed Care – PPO

## 2021-12-09 VITALS — BP 128/88

## 2021-12-09 DIAGNOSIS — R42 Dizziness and giddiness: Secondary | ICD-10-CM | POA: Diagnosis not present

## 2021-12-09 DIAGNOSIS — R262 Difficulty in walking, not elsewhere classified: Secondary | ICD-10-CM

## 2021-12-09 DIAGNOSIS — R2681 Unsteadiness on feet: Secondary | ICD-10-CM

## 2021-12-09 DIAGNOSIS — R2689 Other abnormalities of gait and mobility: Secondary | ICD-10-CM

## 2021-12-09 NOTE — Patient Instructions (Signed)
Forward Progression With 180 (Half) Turns    Walk making a slow half turn in place, leading with head and eyes every 4 steps. Walk in a straight path forward between turns. Alternate turning to the Right and Left. Repeat sequence 4 times per side. Do 2 sessions per day.    Rolling    With pillow under head, start on back. Roll slowly to right. Hold position until symptoms subside. Roll slowly back onto back Hold position until symptoms subside. Repeat sequence 5 times per session. Do 2 sessions per day.

## 2021-12-09 NOTE — Therapy (Signed)
OUTPATIENT PHYSICAL THERAPY NEURO TREATMENT   Patient Name: Christopher Pugh MRN: [id number] DOB:08/18/1970, 51 y.o., male Today's Date: 12/09/2021  PCP: Chesley Noon, MD REFERRING PROVIDER: Pedro Earls, MD (pt being followed by Dr. Leta Baptist)   PT End of Session - 12/09/21 0850     Visit Number 7    Number of Visits 9    Date for PT Re-Evaluation 01/19/22    Authorization Type BCBS    PT Start Time 0848    PT Stop Time 0933    PT Time Calculation (min) 45 min    Activity Tolerance Patient tolerated treatment well    Behavior During Therapy [facility] for tasks assessed/performed               Past Medical History:  Diagnosis Date   BPH without obstruction/lower urinary tract symptoms    Closed head injury with concussion    x 2   CVA (cerebral vascular accident) ([place]) 10/19/2021   DIzzy, off balance   Headache    Hypogonadism in male    Medical history non-contributory    Past Surgical History:  Procedure Laterality Date   ELBOW SURGERY Right 06/2021   IR ANGIO VERTEBRAL SEL VERTEBRAL BILAT MOD SED  11/01/2021   IR ANGIO VERTEBRAL SEL VERTEBRAL UNI R MOD SED  11/04/2021   IR ANGIOGRAM EXTREMITY RIGHT  11/04/2021   IR CT HEAD LTD  10/19/2021   IR INTRA CRAN STENT  11/04/2021   IR PERCUTANEOUS ART THROMBECTOMY/INFUSION INTRACRANIAL INC DIAG ANGIO  10/19/2021   IR US GUIDE VASC ACCESS RIGHT  10/19/2021   IR US GUIDE VASC ACCESS RIGHT  11/04/2021   RADIOLOGY WITH ANESTHESIA N/A 10/19/2021   Procedure: IR WITH ANESTHESIA;  Surgeon: Radiologist, Medication, MD;  Location: [place];  Service: Radiology;  Laterality: N/A;   RADIOLOGY WITH ANESTHESIA N/A 11/04/2021   Procedure: ANGIOPLASTY/STENTING;  Surgeon: Luanne Bras, MD;  Location: [place];  Service: Radiology;  Laterality: N/A;   Patient Active Problem List   Diagnosis Date Noted   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   TIA (transient ischemic attack) 10/30/2021   HLD (hyperlipidemia)  10/30/2021   Cerebrovascular accident (CVA) ([place]) 10/19/2021   BPH without obstruction/lower urinary tract symptoms 10/19/2021   Hypogonadism in male 10/19/2021   Alcohol abuse 10/19/2021   C1 cervical fracture ([place]) 10/19/2021    ONSET DATE: 11/05/2021 (date of referral)  REFERRING DIAG:  I63.211 (ICD-10-CM) - Cerebrovascular accident (CVA) due to stenosis of right vertebral artery ([place])   THERAPY DIAG:  Dizziness and giddiness  Unsteadiness on feet  Other abnormalities of gait and mobility  Difficulty in walking, not elsewhere classified  SUBJECTIVE:  SUBJECTIVE STATEMENT: Patient reports was dizzy yesterday, and then dizzy this morning as well. No other new changes/complaints. Bruising more easily due to the blood thinners.   Pt accompanied by: self  PERTINENT HISTORY: PERTINENT HISTORY: BPH, hypogonadism, C1 Burst Fx  51 y.o. 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  He was discharged on 11/01/2021 for outpatient follow-up.  Patient had right vertebral artery stenting on 07/08/9700, without complication.  He was discharged on aspirin and Plavix.    PAIN:  Are you having pain? Yes, 3/10 normal headache.   Vitals:   12/09/21 0855  BP: 128/88      PRECAUTIONS: Other: No driving.  No heavy lifting, no upside down.   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    OBJECTIVE:  GAIT: Gait pattern: step through pattern and wide BOS Distance walked: clinic distance with activities; plus additional jog of approx 60' No imbalance noted, minimal veering, mild dizziness Assistive device utilized: None Level of assistance: Mod I   NMR:  Habituation: Repeated Rolling: completed repeated rolling from supine to R side, completed x 5 reps mild dizziness with improvements with pause between movements. Added to HEP. 180 Deg Turns: Completed repeated 180 deg turns to R/L, alternating direction, completed x 5 reps to both sides, with mild-mod dizziness intermittent. Added to HEP     [facility] PT Assessment - 12/09/21 0001       Functional Gait  Assessment   Gait assessed  Yes    Gait Level Surface Walks 20 ft in less than 5.5 sec, no assistive devices, good speed, no evidence for imbalance, normal gait pattern, deviates no more than 6 in outside of the 12 in walkway width.    Change in Gait Speed Able to smoothly change walking speed without loss of balance or gait deviation. Deviate no more than 6 in outside of the 12 in walkway width.    Gait with Horizontal Head Turns Performs head turns smoothly with slight change in gait velocity (eg, minor disruption to smooth gait path), deviates 6-10 in outside 12 in walkway width, or uses an assistive device.    Gait with Vertical Head Turns Performs task with slight change in gait velocity (eg, minor disruption to smooth gait path), deviates 6 - 10 in outside 12 in walkway width or uses assistive device    Gait and Pivot Turn Pivot turns safely within 3 sec and stops quickly with no loss of balance.   still mild dizziness   Step Over Obstacle Is able to step over 2 stacked shoe boxes taped together (9 in total height) without changing gait speed. No evidence of imbalance.    Gait with Narrow Base of Support Is able to ambulate for 10 steps heel to toe with no staggering.    Gait with Eyes Closed Walks 20 ft,  uses assistive device, slower speed, mild gait deviations, deviates 6-10 in outside 12 in walkway width. Ambulates 20 ft in less than 9 sec but greater than 7 sec.   mild dizziness   Ambulating Backwards Walks 20 ft, no assistive devices, good speed, no evidence for imbalance, normal gait  Steps Alternating feet, no rail.   2-3/10   Total Score 27    FGA comment: 27/30               Motion Sensitivity Quotient (baseline 1-2/10 dizziness at start of MSQ.    Intensity: 0 = none, 1 = Lightheaded, 2 = Mild, 3 = Moderate, 4 = Severe, 5 = Vomiting   Intensity  1. Sitting to supine 0 (no increase in dizziness)  2. Supine to L side 1  3. Supine to R side 2  4. Supine to sitting 2  5. L Side-lying 1  6. Up from L  1  7. R Side-lying 2  8. Up from R  1  9. Sitting, head  tipped to L knee 2  10. Head up from L  knee 2  11. Sitting, head  tipped to R knee 2  12. Head up from R  knee 2  13. Sitting head turns x5 2  14.Sitting head nods x5 2  15. In stance, 180  turn to L  1  16. In stance, 180  turn to R 2   Updated HEP to include the following:  Forward Progression With 180 (Half) Turns    Walk making a slow half turn in place, leading with head and eyes every 4 steps. Walk in a straight path forward between turns. Alternate turning to the Right and Left. Repeat sequence 4 times per side. Do 2 sessions per day.    Rolling    With pillow under head, start on back. Roll slowly to right. Hold position until symptoms subside. Roll slowly back onto back Hold position until symptoms subside. Repeat sequence 5 times per session. Do 2 sessions per day.  PATIENT EDUCATION: Education details: Progress toward LTGs; HEP additions Person educated: Patient Education method: Theatre stage manager Education comprehension: verbalized understanding   HOME EXERCISE PROGRAM: Access Code: [email or web address]    GOALS: Goals reviewed with patient? Yes  SHORT TERM GOALS: ALL STGS =  LTGS  LONG TERM GOALS:   Pt will be independent with final HEP for improved balance/coordination. Baseline: reported independence with current HEP, will benefit from progressive HEP Goal status: IN PROGRESS  2.  Pt will improve FGA to >/= 29/30 to demonstrate improved balance and reduced fall risk Baseline: 26/30; 27/30 Goal status: IN PROGRESS  3.  Patient will be able to jog 50'  without imbalance  Baseline: pt starting to jog on treadmill; jogging 60' over ground; 3 minutes on treadmill Goal status: MET  4.  Pt will improve FOTO to >/= 70% Baseline: 59% (when given at prior eval) Goal status: IN PROGRESS  5.  Pt will score at least an 80 on composite score of SOT and have vestibular score on sensory analysis be WNL in order to demo improved vestibular input for balance.  Baseline: Composite: 80, vestibular below normal limits.  Goal status: IN PROGRESS   6.  MSQ to be assessed with goal written.  Baseline: Not yet assessed. Goal status: IN PROGRESS  Updated Long Term Goals:  LONG TERM GOALS: TARGET DATE: 01/08/22  Pt will be independent with final HEP for improved balance/coordination. Baseline: reported independence with current HEP, will benefit from progressive HEP Goal status: IN PROGRESS  2.  Pt will improve FGA to >/= 29/30 to demonstrate improved balance and reduced fall risk Baseline: 26/30; 27/30 Goal status: IN PROGRESS  3.  Patient will be able to jog for 10 minutes to allow for return  to PLOF Baseline: pt starting to jog on treadmill; jogging 60' over ground; 3 minutes on treadmill Goal status: REVISED  4.  Pt will improve FOTO to >/= 70% Baseline: 59% (when given at prior eval) Goal status: IN PROGRESS  5.  Pt will score at least an 80 on composite score of SOT and have vestibular score on sensory analysis be WNL in order to demo improved vestibular input for balance.  Baseline: Composite: 80, vestibular below normal limits.  Goal status: IN  PROGRESS  6.  Pt will report </= 1/5 for all movements on MSQ to indicate improvement in motion sensitivity and improved activity tolerance.  Baseline: See above Goal status: IN PROGRESS  ASSESSMENT:  CLINICAL IMPRESSION: Today's skilled PT session focused on assessment of patient's progress toward LTGs. Patient demo improvement on FGA and MSQ, however still not quite to goal level at this time due to continued dizziness. Patient is able to jog approx 60' on level ground. Will benefit from continued skilled PT services to progress toward LTGs and return to PLOF. Will continue per POC.   OBJECTIVE IMPAIRMENTS Abnormal gait, decreased activity tolerance, decreased balance, difficulty walking, decreased safety awareness, and dizziness.   ACTIVITY LIMITATIONS community activity, driving, occupation, and yard work.   PERSONAL FACTORS 1-2 comorbidities: 1-2 comorbidities: BPH, hypogonadism, C1 Burst Fx   are also affecting patient's functional outcome  are also affecting patient's functional outcome.    REHAB POTENTIAL: Excellent  CLINICAL DECISION MAKING: Stable/uncomplicated  EVALUATION COMPLEXITY: Low  PLAN: PT FREQUENCY: 2x/week  PT DURATION: 8 weeks  PLANNED INTERVENTIONS: Therapeutic exercises, Therapeutic activity, Neuromuscular re-education, Balance training, Gait training, Patient/Family education, Joint mobilization, Stair training, Vestibular training, DME instructions, Aquatic Therapy, Electrical stimulation, Cryotherapy, Moist heat, and Manual therapy  PLAN FOR NEXT SESSION: Monitor BP. Progress Habituation. Continue to review prior HEP and update as needed. Balance for incr vestibular input.    Jones Bales, PT, DPT  12/09/2021, 9:55 AM

## 2021-12-14 ENCOUNTER — Ambulatory Visit: Payer: BC Managed Care – PPO

## 2021-12-14 VITALS — BP 111/90 | HR 68

## 2021-12-14 DIAGNOSIS — R262 Difficulty in walking, not elsewhere classified: Secondary | ICD-10-CM

## 2021-12-14 DIAGNOSIS — R42 Dizziness and giddiness: Secondary | ICD-10-CM | POA: Diagnosis not present

## 2021-12-14 DIAGNOSIS — R2689 Other abnormalities of gait and mobility: Secondary | ICD-10-CM

## 2021-12-14 DIAGNOSIS — R2681 Unsteadiness on feet: Secondary | ICD-10-CM

## 2021-12-14 NOTE — Therapy (Signed)
OUTPATIENT PHYSICAL THERAPY NEURO TREATMENT   Patient Name: Christopher Pugh MRN: [id number] DOB:Mar 10, 1971, 51 y.o., male Today's Date: 12/14/2021  PCP: Chesley Noon, MD REFERRING PROVIDER: Pedro Earls, MD (pt being followed by Dr. Leta Baptist)   PT End of Session - 12/14/21 0719     Visit Number 8    Number of Visits 17    Date for PT Re-Evaluation 01/19/22    Authorization Type BCBS    PT Start Time 0717    PT Stop Time 0759    PT Time Calculation (min) 42 min    Activity Tolerance Patient tolerated treatment well    Behavior During Therapy [facility] for tasks assessed/performed              Past Medical History:  Diagnosis Date   BPH without obstruction/lower urinary tract symptoms    Closed head injury with concussion    x 2   CVA (cerebral vascular accident) ([place]) 10/19/2021   DIzzy, off balance   Headache    Hypogonadism in male    Medical history non-contributory    Past Surgical History:  Procedure Laterality Date   ELBOW SURGERY Right 06/2021   IR ANGIO VERTEBRAL SEL VERTEBRAL BILAT MOD SED  11/01/2021   IR ANGIO VERTEBRAL SEL VERTEBRAL UNI R MOD SED  11/04/2021   IR ANGIOGRAM EXTREMITY RIGHT  11/04/2021   IR CT HEAD LTD  10/19/2021   IR INTRA CRAN STENT  11/04/2021   IR PERCUTANEOUS ART THROMBECTOMY/INFUSION INTRACRANIAL INC DIAG ANGIO  10/19/2021   IR US GUIDE VASC ACCESS RIGHT  10/19/2021   IR US GUIDE VASC ACCESS RIGHT  11/04/2021   RADIOLOGY WITH ANESTHESIA N/A 10/19/2021   Procedure: IR WITH ANESTHESIA;  Surgeon: Radiologist, Medication, MD;  Location: [place];  Service: Radiology;  Laterality: N/A;   RADIOLOGY WITH ANESTHESIA N/A 11/04/2021   Procedure: ANGIOPLASTY/STENTING;  Surgeon: Luanne Bras, MD;  Location: [place];  Service: Radiology;  Laterality: N/A;   Patient Active Problem List   Diagnosis Date Noted   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   TIA (transient ischemic attack) 10/30/2021   HLD (hyperlipidemia)  10/30/2021   Cerebrovascular accident (CVA) ([place]) 10/19/2021   BPH without obstruction/lower urinary tract symptoms 10/19/2021   Hypogonadism in male 10/19/2021   Alcohol abuse 10/19/2021   C1 cervical fracture ([place]) 10/19/2021    ONSET DATE: 11/05/2021 (date of referral)  REFERRING DIAG:  I63.211 (ICD-10-CM) - Cerebrovascular accident (CVA) due to stenosis of right vertebral artery ([place])   THERAPY DIAG:  Dizziness and giddiness  Unsteadiness on feet  Other abnormalities of gait and mobility  Difficulty in walking, not elsewhere classified  SUBJECTIVE:  SUBJECTIVE STATEMENT: Patient reports uneventful weekend. Reports tinnitus continues to fluctuate in intensity. No other new changes/complaints. New exercises went well.   Pt accompanied by: self  PERTINENT HISTORY: PERTINENT HISTORY: BPH, hypogonadism, C1 Burst Fx  51 y.o. 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  He was discharged on 11/01/2021 for outpatient follow-up.  Patient had right vertebral artery stenting on 4/0/9811, without complication.  He was discharged on aspirin and Plavix.    PAIN:  Are you having pain? No  Vitals:   12/14/21 0722  BP: 111/90  Pulse: 68       PRECAUTIONS: Other: No driving.  No heavy lifting, no upside down.   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     OBJECTIVE:  GAIT: Gait pattern: step through pattern and wide BOS Distance walked: clinic distance with activities; noted, minimal veering, mild dizziness Assistive device utilized: None Level of assistance: Mod I   Completed jogging on treadmill, completed jogging on 5.0 mph x 4 minutes, then slowed paced at 4.0 mph x 1 minute, then additional 4 minutes at 5.0 mph. Then completed cool down on 4.0 mph x 1 minutes. Pt reporting mild increase in dizziness (3/10), reports he continues to notice it increase with longer distance jogging. Intermittent veering noted with jog mainly to the R, intermittent touch to handles as needed but not as much as needed prior.    NMR:  Standing on Trampoline: completed gentle bouncing with eyes focused on targeting for 30 seconds. Mild dizziness, 3/10 reported with gentle bouncing. Progressed to then completed bouncing with 180 Deg Turns x 5 reps each direction, with pause in midline direction. 3/10 dizziness. Then progressed to 180 deg turns x 8 reps. More balance challenge noted with turns to R > L. BP after activity: 138/106, HR: 81. Required seated rest break. Reassessed BP readings after rest break with improvements to baseline.  Completed visual tracking with ambulation: completed ambulation with self ball toss x 4 x 15', then completed visual tracking with CW/CCW circles 4 x 15'. Then completed ambulation with horizontal/vertical head turns 2 x 115' with turning to card positioned on R/L, progressing speed of head movement and placing in various directions. Mild Dizziness noted with CW/CCW circles.   PATIENT EDUCATION: Education details:Continue HEP  Person educated: Patient Education method: Theatre stage manager Education comprehension: verbalized understanding   HOME EXERCISE PROGRAM: Access Code: [email or web address]    GOALS: Goals reviewed with patient? Yes  SHORT TERM GOALS: ALL STGS = LTGS  LONG TERM GOALS:   Pt will be independent with  final HEP for improved balance/coordination. Baseline: reported independence with current HEP, will benefit from progressive HEP Goal status: IN PROGRESS  2.  Pt will improve FGA to >/= 29/30 to demonstrate improved balance and reduced fall risk Baseline: 26/30; 27/30 Goal status: IN PROGRESS  3.  Patient will be able to jog 50'  without imbalance  Baseline: pt starting to jog on treadmill; jogging 60' over ground; 3 minutes on treadmill Goal status: MET  4.  Pt will improve FOTO to >/= 70% Baseline: 59% (when given at prior eval) Goal status: IN PROGRESS  5.  Pt will score at least an 80 on composite score of SOT and have vestibular score on sensory analysis be WNL in order to demo improved vestibular input for balance.  Baseline: Composite: 80, vestibular below normal limits.  Goal status: IN PROGRESS   6.  MSQ to be assessed with goal written.  Baseline: Not yet assessed. Goal status: IN PROGRESS  Updated Long Term Goals:  LONG TERM GOALS: TARGET DATE: 01/08/22  Pt will be independent with final HEP for improved balance/coordination. Baseline: reported independence with current HEP, will benefit from progressive HEP Goal status: IN PROGRESS  2.  Pt will improve FGA to >/= 29/30 to demonstrate improved balance and reduced fall risk Baseline: 26/30; 27/30 Goal status: IN PROGRESS  3.  Patient will be able to jog for 10 minutes to allow for return to PLOF Baseline: pt starting to jog on treadmill; jogging 60' over ground; 3 minutes on treadmill Goal status: REVISED  4.  Pt will improve FOTO to >/= 70% Baseline: 59% (when given at prior eval) Goal status: IN PROGRESS  5.  Pt will score at least an 80 on composite score of SOT and have vestibular score on sensory analysis be WNL in order to demo improved vestibular input for balance.  Baseline: Composite: 80, vestibular below normal limits.  Goal status: IN PROGRESS  6.  Pt will report </= 1/5 for all movements on MSQ to  indicate improvement in motion sensitivity and improved activity tolerance.  Baseline: See above Goal status: IN PROGRESS  ASSESSMENT:  CLINICAL IMPRESSION: Today's skilled PT session focused on continued tolerance for jogging/aerobic activity, patient tolerating intervals of running for 4 minutes each. Mild dizziness but overall tolerating well. Continued habituation to turns and progressing visual activities. Patient tolerating well. Increase in diastolic BP noted requiring seated rest break.   OBJECTIVE IMPAIRMENTS Abnormal gait, decreased activity tolerance, decreased balance, difficulty walking, decreased safety awareness, and dizziness.   ACTIVITY LIMITATIONS community activity, driving, occupation, and yard work.   PERSONAL FACTORS 1-2 comorbidities: 1-2 comorbidities: BPH, hypogonadism, C1 Burst Fx   are also affecting patient's functional outcome  are also affecting patient's functional outcome.    REHAB POTENTIAL: Excellent  CLINICAL DECISION MAKING: Stable/uncomplicated  EVALUATION COMPLEXITY: Low  PLAN: PT FREQUENCY: 2x/week  PT DURATION: 8 weeks  PLANNED INTERVENTIONS: Therapeutic exercises, Therapeutic activity, Neuromuscular re-education, Balance training, Gait training, Patient/Family education, Joint mobilization, Stair training, Vestibular training, DME instructions, Aquatic Therapy, Electrical stimulation, Cryotherapy, Moist heat, and Manual therapy  PLAN FOR NEXT SESSION: Monitor BP. Progress Habituation as needed. Trampoline activities Continue to review prior HEP and update as needed. Balance for incr vestibular input.    Jones Bales, PT, DPT  12/14/2021, 8:07 AM

## 2021-12-17 ENCOUNTER — Encounter: Payer: Self-pay | Admitting: Physical Therapy

## 2021-12-17 ENCOUNTER — Ambulatory Visit: Payer: BC Managed Care – PPO | Admitting: Physician Assistant

## 2021-12-17 ENCOUNTER — Ambulatory Visit: Payer: BC Managed Care – PPO | Admitting: Physical Therapy

## 2021-12-17 ENCOUNTER — Encounter: Payer: Self-pay | Admitting: Physician Assistant

## 2021-12-17 VITALS — BP 127/87 | HR 63

## 2021-12-17 DIAGNOSIS — R42 Dizziness and giddiness: Secondary | ICD-10-CM | POA: Diagnosis not present

## 2021-12-17 DIAGNOSIS — R2689 Other abnormalities of gait and mobility: Secondary | ICD-10-CM

## 2021-12-17 DIAGNOSIS — M7741 Metatarsalgia, right foot: Secondary | ICD-10-CM | POA: Diagnosis not present

## 2021-12-17 DIAGNOSIS — R2681 Unsteadiness on feet: Secondary | ICD-10-CM

## 2021-12-17 NOTE — Progress Notes (Signed)
HPI: Christopher Pugh comes in today with right foot pain.  This been ongoing for 4 months.  He notes pain in the ball of his foot.  Has tried ice.  Pain comes and goes.  Notes soreness no significant pain.  He has had no known injury to the foot.  Occasionally does wear her back shoes.  He does wear inserts in his shoes and changes his shoes out daily.  Review of systems see HPI otherwise negative  Physical exam: General well-developed well-nourished male no acute distress. Psych: Alert and oriented x3  Right foot slight callus under the second metatarsal head.  Tenderness over the second metatarsal head.  On exam Morton's type foot with second third metatarsal being longer first.  Otherwise no rashes skin lesions ulcerations.  Dorsal pedal pulses 2+.  Achilles nontender on the right.  Tight gastroc on the right.  Impression: Second metatarsalgia right foot  Plan: Discussed stretching gastrocsoleus.  Discussed shoe wear.  He will use Voltaren gel 2 g 4 times daily to the metatarsal head region.  File down the callus on the second metatarsal head region.  He will follow-up with [place] as needed pain persist or becomes worse.

## 2021-12-17 NOTE — Therapy (Signed)
OUTPATIENT PHYSICAL THERAPY NEURO TREATMENT   Patient Name: DOREN KASPAR MRN: [id number] DOB:06/29/1971, 51 y.o., male Today's Date: 12/17/2021  PCP: Chesley Noon, MD REFERRING PROVIDER: Pedro Earls, MD (pt being followed by Dr. Leta Baptist)   PT End of Session - 12/17/21 0805     Visit Number 9    Number of Visits 17    Date for PT Re-Evaluation 01/19/22    Authorization Type BCBS    PT Start Time 0803    PT Stop Time 0843    PT Time Calculation (min) 40 min    Activity Tolerance Patient tolerated treatment well    Behavior During Therapy [facility] for tasks assessed/performed              Past Medical History:  Diagnosis Date   BPH without obstruction/lower urinary tract symptoms    Closed head injury with concussion    x 2   CVA (cerebral vascular accident) ([place]) 10/19/2021   DIzzy, off balance   Headache    Hypogonadism in male    Medical history non-contributory    Past Surgical History:  Procedure Laterality Date   ELBOW SURGERY Right 06/2021   IR ANGIO VERTEBRAL SEL VERTEBRAL BILAT MOD SED  11/01/2021   IR ANGIO VERTEBRAL SEL VERTEBRAL UNI R MOD SED  11/04/2021   IR ANGIOGRAM EXTREMITY RIGHT  11/04/2021   IR CT HEAD LTD  10/19/2021   IR INTRA CRAN STENT  11/04/2021   IR PERCUTANEOUS ART THROMBECTOMY/INFUSION INTRACRANIAL INC DIAG ANGIO  10/19/2021   IR US GUIDE VASC ACCESS RIGHT  10/19/2021   IR US GUIDE VASC ACCESS RIGHT  11/04/2021   RADIOLOGY WITH ANESTHESIA N/A 10/19/2021   Procedure: IR WITH ANESTHESIA;  Surgeon: Radiologist, Medication, MD;  Location: [place];  Service: Radiology;  Laterality: N/A;   RADIOLOGY WITH ANESTHESIA N/A 11/04/2021   Procedure: ANGIOPLASTY/STENTING;  Surgeon: Luanne Bras, MD;  Location: [place];  Service: Radiology;  Laterality: N/A;   Patient Active Problem List   Diagnosis Date Noted   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   TIA (transient ischemic attack) 10/30/2021   HLD (hyperlipidemia)  10/30/2021   Cerebrovascular accident (CVA) ([place]) 10/19/2021   BPH without obstruction/lower urinary tract symptoms 10/19/2021   Hypogonadism in male 10/19/2021   Alcohol abuse 10/19/2021   C1 cervical fracture ([place]) 10/19/2021    ONSET DATE: 11/05/2021 (date of referral)  REFERRING DIAG:  I63.211 (ICD-10-CM) - Cerebrovascular accident (CVA) due to stenosis of right vertebral artery ([place])   THERAPY DIAG:  Dizziness and giddiness  Unsteadiness on feet  Other abnormalities of gait and mobility  SUBJECTIVE:  SUBJECTIVE STATEMENT: Tuesday was a good day, and yesterday wasn't the best day. Went jogging on the treadmill yesterday, worked through it with the dizziness. Reports a swollen area on his big toe.   Pt accompanied by: self  PERTINENT HISTORY: PERTINENT HISTORY: BPH, hypogonadism, C1 Burst Fx  51 y.o. 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  He was discharged on 11/01/2021 for outpatient follow-up.  Patient had right vertebral artery stenting on 09/05/9177, without complication.  He was discharged on aspirin and Plavix.    PAIN:  Are you having pain? No  Vitals:   12/17/21 0808  BP: 127/87  Pulse: 63        PRECAUTIONS: Other: No driving.  No heavy lifting, no upside down.   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    OBJECTIVE:   GAIT: Gait pattern: step through pattern and wide BOS Distance walked: clinic distance with activities; noted, minimal veering, mild dizziness Assistive device utilized: None Level of assistance: Mod I     NMR:   Outdoors on Grass: Gait with head turns and head nods on command over approx. 50' x3 reps, gradually incr speed of gait. Pt with mild dizziness that stopped after pt stopped head motions/ambulation. Performed speed walking with quick turns to R and L on command x3 reps each direction and then progressed to slow jogging and quick turns x3-4 reps each direction with jogging, pt with mild-mod dizziness after and needing a brief standing rest break to allow for symptoms to subside before continuing. Pt more challenged with turning to L and at times takes a couple steps to maintain balance.  Ambulating forwards and backwards while performing CW and CCW circles with ball for visual tracking, used brick as busy backgrounds, performed 4 x 15', alternating between CW and [place]. Pt with more veering with retro gait and unsteadiness, but able to maintain balance. Pt with mod dizziness that subsided quickly. Pt challenged by use of brick as busy background.  BP after: 133/88. HR: 62 bpm   Rockerboard: In A/P direction: keeping board steady and EC 2 x 30 seconds, then attempted to perform keeping board steady and performing x5 reps head turns with EC, pt able to do 2-3 reps at a time before losing balance, pt determined to try to make it to 5 reps and performed multiple times, pt able to make it to 4 head turns with last rep.   On thicker blue foam: Feet apart EC: x10 reps head turns, x10 reps head nods - pt with incr postural sway, but able to maintain balance, then repeated with feet slightly closer together with pt more challenged by head nods and needing intermittent taps for balance.   On blue side of BOSU: performed alternating step ups with contralateral march for dynamic SLS, incr difficulty  with maintaining balance on R side compared to L, but did improve with incr reps, performed x10 reps each side, cues for glute/core activation.    PATIENT EDUCATION: Education details:Continue HEP  Person educated: Patient Education method: Theatre stage manager Education comprehension: verbalized understanding   HOME EXERCISE PROGRAM: Access Code: [email or web address]  GOALS: Goals reviewed with patient? Yes  SHORT TERM GOALS: ALL STGS = LTGS  LONG TERM GOALS:   Pt will be independent with final HEP for improved balance/coordination. Baseline: reported independence with current HEP, will benefit from progressive HEP Goal status: IN PROGRESS  2.  Pt will improve FGA to >/= 29/30 to demonstrate improved balance and reduced fall risk Baseline: 26/30; 27/30 Goal status: IN PROGRESS  3.  Patient will be able to jog 50'  without imbalance  Baseline: pt starting to jog on treadmill; jogging 60' over ground; 3 minutes on treadmill Goal status: MET  4.  Pt will improve FOTO to >/= 70% Baseline: 59% (when given at prior eval) Goal status: IN PROGRESS  5.  Pt will score at least an 80 on composite score of SOT and have vestibular score on sensory analysis be WNL in order to demo improved vestibular input for balance.  Baseline: Composite: 80, vestibular below normal limits.  Goal status: IN PROGRESS   6.  MSQ to be assessed with goal written.  Baseline: Not yet assessed. Goal status: IN PROGRESS  Updated Long Term Goals:  LONG TERM GOALS: TARGET DATE: 01/08/22  Pt will be independent with final HEP for improved balance/coordination. Baseline: reported independence with current HEP, will benefit from progressive HEP Goal status: IN PROGRESS  2.  Pt will improve FGA to >/= 29/30 to demonstrate improved balance and reduced fall risk Baseline: 26/30; 27/30 Goal status: IN PROGRESS  3.  Patient will be able to jog for 10 minutes to allow for return to PLOF Baseline: pt starting to  jog on treadmill; jogging 60' over ground; 3 minutes on treadmill Goal status: REVISED  4.  Pt will improve FOTO to >/= 70% Baseline: 59% (when given at prior eval) Goal status: IN PROGRESS  5.  Pt will score at least an 80 on composite score of SOT and have vestibular score on sensory analysis be WNL in order to demo improved vestibular input for balance.  Baseline: Composite: 80, vestibular below normal limits.  Goal status: IN PROGRESS  6.  Pt will report </= 1/5 for all movements on MSQ to indicate improvement in motion sensitivity and improved activity tolerance.  Baseline: See above Goal status: IN PROGRESS  ASSESSMENT:  CLINICAL IMPRESSION: Today's skilled session continued to progress habituation to turns, visual tracking activities, and balance on compliant surfaces and for incr vestibular input. Pt with mild/mod dizziness throughout session that subsided quickly. Pt with more difficulty when performing turns to the L. Pt challenged today with EC tasked on rockerboard when adding in head turns. At end of session pt reporting his symptoms have returned to baseline and he felt the same as when he came in. Will continue to progress towards LTGs.    OBJECTIVE IMPAIRMENTS Abnormal gait, decreased activity tolerance, decreased balance, difficulty walking, decreased safety awareness, and dizziness.   ACTIVITY LIMITATIONS community activity, driving, occupation, and yard work.   PERSONAL FACTORS 1-2 comorbidities: 1-2 comorbidities: BPH, hypogonadism, C1 Burst Fx   are also affecting patient's functional outcome  are also affecting patient's functional outcome.    REHAB POTENTIAL: Excellent  CLINICAL DECISION MAKING: Stable/uncomplicated  EVALUATION COMPLEXITY: Low  PLAN: PT FREQUENCY: 2x/week  PT DURATION: 8 weeks  PLANNED INTERVENTIONS: Therapeutic exercises, Therapeutic activity, Neuromuscular re-education, Balance training, Gait training, Patient/Family education, Joint  mobilization, Stair training, Vestibular training, DME instructions, Aquatic Therapy, Electrical stimulation, Cryotherapy, Moist heat, and Manual therapy  PLAN FOR NEXT SESSION: Monitor BP. Progress Habituation  as needed. Trampoline activities Continue to review prior HEP and update as needed. Balance for incr vestibular input.    Arliss Journey, PT, DPT  12/17/2021, 8:55 AM

## 2021-12-21 ENCOUNTER — Ambulatory Visit: Payer: BC Managed Care – PPO

## 2021-12-21 VITALS — BP 128/91 | HR 68

## 2021-12-21 DIAGNOSIS — R262 Difficulty in walking, not elsewhere classified: Secondary | ICD-10-CM

## 2021-12-21 DIAGNOSIS — R42 Dizziness and giddiness: Secondary | ICD-10-CM | POA: Diagnosis not present

## 2021-12-21 DIAGNOSIS — R2681 Unsteadiness on feet: Secondary | ICD-10-CM

## 2021-12-21 DIAGNOSIS — R2689 Other abnormalities of gait and mobility: Secondary | ICD-10-CM

## 2021-12-21 NOTE — Therapy (Signed)
OUTPATIENT PHYSICAL THERAPY NEURO TREATMENT   Patient Name: Christopher Pugh MRN: [id number] DOB:01-14-1971, 51 y.o., male Today's Date: 12/21/2021  PCP: Chesley Noon, MD REFERRING PROVIDER: Pedro Earls, MD (pt being followed by Dr. Leta Baptist)   PT End of Session - 12/21/21 0846     Visit Number 10    Number of Visits 17    Date for PT Re-Evaluation 01/19/22    Authorization Type BCBS    PT Start Time 0845    PT Stop Time 0930    PT Time Calculation (min) 45 min    Activity Tolerance Patient tolerated treatment well    Behavior During Therapy [facility] for tasks assessed/performed              Past Medical History:  Diagnosis Date   BPH without obstruction/lower urinary tract symptoms    Closed head injury with concussion    x 2   CVA (cerebral vascular accident) ([place]) 10/19/2021   DIzzy, off balance   Headache    Hypogonadism in male    Medical history non-contributory    Past Surgical History:  Procedure Laterality Date   ELBOW SURGERY Right 06/2021   IR ANGIO VERTEBRAL SEL VERTEBRAL BILAT MOD SED  11/01/2021   IR ANGIO VERTEBRAL SEL VERTEBRAL UNI R MOD SED  11/04/2021   IR ANGIOGRAM EXTREMITY RIGHT  11/04/2021   IR CT HEAD LTD  10/19/2021   IR INTRA CRAN STENT  11/04/2021   IR PERCUTANEOUS ART THROMBECTOMY/INFUSION INTRACRANIAL INC DIAG ANGIO  10/19/2021   IR US GUIDE VASC ACCESS RIGHT  10/19/2021   IR US GUIDE VASC ACCESS RIGHT  11/04/2021   RADIOLOGY WITH ANESTHESIA N/A 10/19/2021   Procedure: IR WITH ANESTHESIA;  Surgeon: Radiologist, Medication, MD;  Location: [place];  Service: Radiology;  Laterality: N/A;   RADIOLOGY WITH ANESTHESIA N/A 11/04/2021   Procedure: ANGIOPLASTY/STENTING;  Surgeon: Luanne Bras, MD;  Location: [place];  Service: Radiology;  Laterality: N/A;   Patient Active Problem List   Diagnosis Date Noted   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   TIA (transient ischemic attack) 10/30/2021   HLD (hyperlipidemia)  10/30/2021   Cerebrovascular accident (CVA) ([place]) 10/19/2021   BPH without obstruction/lower urinary tract symptoms 10/19/2021   Hypogonadism in male 10/19/2021   Alcohol abuse 10/19/2021   C1 cervical fracture ([place]) 10/19/2021    ONSET DATE: 11/05/2021 (date of referral)  REFERRING DIAG:  I63.211 (ICD-10-CM) - Cerebrovascular accident (CVA) due to stenosis of right vertebral artery ([place])   THERAPY DIAG:  Dizziness and giddiness  Unsteadiness on feet  Other abnormalities of gait and mobility  Difficulty in walking, not elsewhere classified  SUBJECTIVE:  SUBJECTIVE STATEMENT: Had a good weekend. Reports tired this morning, dizziness is not terrible. "It is what it is". Did a walk/run yesterday, four minutes on. Reports did not feel terrible after all.   Pt accompanied by: self  PERTINENT HISTORY: PERTINENT HISTORY: BPH, hypogonadism, C1 Burst Fx  51 y.o. 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  He was discharged on 11/01/2021 for outpatient follow-up.  Patient had right vertebral artery stenting on 9/0/3009, without complication.  He was discharged on aspirin and Plavix.    PAIN:  Are you having pain? No  Vitals:   12/21/21 0850 12/21/21 0928  BP: (!) 125/92 (!) 128/91  Pulse: 61 68     PRECAUTIONS: Other: No driving.  No heavy lifting, no upside down.   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    OBJECTIVE:  GAIT: Gait pattern: step through pattern and wide BOS Distance walked: clinic distance with activities Assistive device utilized: None Level of assistance: Mod I   STRENGTHENING:  Completed Elliptical on Level 4.0 x 3 minutes forwards, followed by 3 minutes backwards. No increase in dizziness reported compared to treadmill, mild increase in dizziness however from baseline.   Gaze Stabilization:  VOR x 1 Horizontal: with ambulation forwards, completed ambulation forward 10' x 4 reps. Mild Dizziness.  VOR x 1 Vertical: with ambulation forwards, completed ambulation forward 10' x 4 reps. Mild Dizziness. Then transitioned to completed backwards walking with VOR vertical, 10' x 3 reps. More balance challenge noted.    Habituation: 180 Deg Turns: completed ambulation forwards with 180 deg turns with 3-4 steps between eyes closed, x 8 reps to R/L direction. Increased challenge returning to midline. CGA required. 360 Deg Turns: Progressed to ambulation forwards with 360 deg turns with 2-3 steps x 4 reps with eyes open, patient tolerating well with minimal increase in dizziness. Then progressed to completing 360 deg turns with eyes closed, x 5 reps. Intermittent CGA.   NMR: On thicker blue foam standing with feet apart on incline: completed feet apart with eyes closed and marching x 30 seconds, then added in horizontal/vertical head turns x 10 reps with eyes closed. CGA.   On blue side of BOSU: performed eyes closed 3 x 30 seconds, intermittent increased sway noted required CGA and intermittent touch A to // bars. Then transitioning to completing eyes closed with addition of horizontal/vertical head turns x 5 reps each direction. Increased challenge with addition of head turns, with patient demonstrating frustration.   Standing on Trampoline: completed gentle bouncing without focal point  for 30 seconds. Increased  challenge without focal point. Progressed to then completed bouncing with 180 Deg turns x 8 reps each direction, without pause in midline. 3/10 dizziness. More balance challenge noted with turns to R > L.    PATIENT EDUCATION: Education details:Continue HEP  Person educated: Patient Education method: Theatre stage manager Education comprehension: verbalized understanding   HOME EXERCISE PROGRAM: Access Code: [email or web address]    GOALS: Goals reviewed with patient? Yes  SHORT TERM GOALS: ALL STGS = LTGS   Updated Long Term Goals:  LONG TERM GOALS: TARGET DATE: 01/08/22  Pt will  be independent with final HEP for improved balance/coordination. Baseline: reported independence with current HEP, will benefit from progressive HEP Goal status: IN PROGRESS  2.  Pt will improve FGA to >/= 29/30 to demonstrate improved balance and reduced fall risk Baseline: 26/30; 27/30 Goal status: IN PROGRESS  3.  Patient will be able to jog for 10 minutes to allow for return to PLOF Baseline: pt starting to jog on treadmill; jogging 60' over ground; 3 minutes on treadmill Goal status: REVISED  4.  Pt will improve FOTO to >/= 70% Baseline: 59% (when given at prior eval) Goal status: IN PROGRESS  5.  Pt will score at least an 80 on composite score of SOT and have vestibular score on sensory analysis be WNL in order to demo improved vestibular input for balance.  Baseline: Composite: 80, vestibular below normal limits.  Goal status: IN PROGRESS  6.  Pt will report </= 1/5 for all movements on MSQ to indicate improvement in motion sensitivity and improved activity tolerance.  Baseline: See above Goal status: IN PROGRESS  ASSESSMENT:  CLINICAL IMPRESSION: Today's skilled session continued to progress habituation to turns working toward reduced visual input to further challenge balance, main challenge still noted when vision removed and head turns. Intermittent veering/step with balance challenge. Will  continue to progress towards LTGs.    OBJECTIVE IMPAIRMENTS Abnormal gait, decreased activity tolerance, decreased balance, difficulty walking, decreased safety awareness, and dizziness.   ACTIVITY LIMITATIONS community activity, driving, occupation, and yard work.   PERSONAL FACTORS 1-2 comorbidities: 1-2 comorbidities: BPH, hypogonadism, C1 Burst Fx   are also affecting patient's functional outcome  are also affecting patient's functional outcome.    REHAB POTENTIAL: Excellent  CLINICAL DECISION MAKING: Stable/uncomplicated  EVALUATION COMPLEXITY: Low  PLAN: PT FREQUENCY: 2x/week  PT DURATION: 8 weeks  PLANNED INTERVENTIONS: Therapeutic exercises, Therapeutic activity, Neuromuscular re-education, Balance training, Gait training, Patient/Family education, Joint mobilization, Stair training, Vestibular training, DME instructions, Aquatic Therapy, Electrical stimulation, Cryotherapy, Moist heat, and Manual therapy  PLAN FOR NEXT SESSION: Monitor BP. Progress Habituation as needed. Trampoline activities Continue to review prior HEP and update as needed. Balance for incr vestibular input.    Jones Bales, PT, DPT  12/21/2021, 9:56 AM

## 2021-12-23 ENCOUNTER — Ambulatory Visit: Payer: BC Managed Care – PPO

## 2021-12-23 VITALS — BP 127/86 | HR 49

## 2021-12-23 DIAGNOSIS — R42 Dizziness and giddiness: Secondary | ICD-10-CM

## 2021-12-23 DIAGNOSIS — R262 Difficulty in walking, not elsewhere classified: Secondary | ICD-10-CM

## 2021-12-23 DIAGNOSIS — R2681 Unsteadiness on feet: Secondary | ICD-10-CM

## 2021-12-23 DIAGNOSIS — R2689 Other abnormalities of gait and mobility: Secondary | ICD-10-CM

## 2021-12-23 NOTE — Therapy (Signed)
OUTPATIENT PHYSICAL THERAPY NEURO TREATMENT   Patient Name: Christopher Pugh MRN: [id number] DOB:1970-07-12, 51 y.o., male Today's Date: 12/23/2021  PCP: Chesley Noon, MD REFERRING PROVIDER: Pedro Earls, MD (pt being followed by Dr. Leta Baptist)   PT End of Session - 12/23/21 0715     Visit Number 11    Number of Visits 17    Date for PT Re-Evaluation 01/19/22    Authorization Type BCBS    PT Start Time 0715    PT Stop Time 0800    PT Time Calculation (min) 45 min    Activity Tolerance Patient tolerated treatment well    Behavior During Therapy [facility] for tasks assessed/performed              Past Medical History:  Diagnosis Date   BPH without obstruction/lower urinary tract symptoms    Closed head injury with concussion    x 2   CVA (cerebral vascular accident) ([place]) 10/19/2021   DIzzy, off balance   Headache    Hypogonadism in male    Medical history non-contributory    Past Surgical History:  Procedure Laterality Date   ELBOW SURGERY Right 06/2021   IR ANGIO VERTEBRAL SEL VERTEBRAL BILAT MOD SED  11/01/2021   IR ANGIO VERTEBRAL SEL VERTEBRAL UNI R MOD SED  11/04/2021   IR ANGIOGRAM EXTREMITY RIGHT  11/04/2021   IR CT HEAD LTD  10/19/2021   IR INTRA CRAN STENT  11/04/2021   IR PERCUTANEOUS ART THROMBECTOMY/INFUSION INTRACRANIAL INC DIAG ANGIO  10/19/2021   IR US GUIDE VASC ACCESS RIGHT  10/19/2021   IR US GUIDE VASC ACCESS RIGHT  11/04/2021   RADIOLOGY WITH ANESTHESIA N/A 10/19/2021   Procedure: IR WITH ANESTHESIA;  Surgeon: Radiologist, Medication, MD;  Location: [place];  Service: Radiology;  Laterality: N/A;   RADIOLOGY WITH ANESTHESIA N/A 11/04/2021   Procedure: ANGIOPLASTY/STENTING;  Surgeon: Luanne Bras, MD;  Location: [place];  Service: Radiology;  Laterality: N/A;   Patient Active Problem List   Diagnosis Date Noted   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   TIA (transient ischemic attack) 10/30/2021   HLD (hyperlipidemia)  10/30/2021   Cerebrovascular accident (CVA) ([place]) 10/19/2021   BPH without obstruction/lower urinary tract symptoms 10/19/2021   Hypogonadism in male 10/19/2021   Alcohol abuse 10/19/2021   C1 cervical fracture ([place]) 10/19/2021    ONSET DATE: 11/05/2021 (date of referral)  REFERRING DIAG:  I63.211 (ICD-10-CM) - Cerebrovascular accident (CVA) due to stenosis of right vertebral artery ([place])   THERAPY DIAG:  Dizziness and giddiness  Unsteadiness on feet  Other abnormalities of gait and mobility  Difficulty in walking, not elsewhere classified  SUBJECTIVE:  SUBJECTIVE STATEMENT:  Patient reports that feels okay this morning. Tripped over a dog toy this morning. Reports that he feels a little bit dizzy, not sure if it is the weather or what is increasing it.   Pt accompanied by: self  PERTINENT HISTORY: PERTINENT HISTORY: BPH, hypogonadism, C1 Burst Fx  51 y.o. 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  He was discharged on 11/01/2021 for outpatient follow-up.  Patient had right vertebral artery stenting on 0/08/7251, without complication.  He was discharged on aspirin and Plavix.    PAIN:  Are you having pain? No Vitals:   12/23/21 0721  BP: 127/86  Pulse: (!) 49    PRECAUTIONS: Other: No driving.  No heavy lifting, no upside down.   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    OBJECTIVE:  GAIT: Gait pattern: step through pattern and wide BOS Distance walked: clinic distance with activities Assistive device utilized: None Level of assistance: Mod I   STRENGTHENING:  Completed warm up on level 3.5 mph, then completed jogging on treadmill, completed jogging on 4.5 mph x 2 minutes, then increased paced at 5.0 mph x 2 minute. Then completed cool down on 3.5 mph. Added in visual tracking activities while maintaining this pace. Completed visual tracking including: self ball toss 2 x 10 reps, then PT throwing ball to R/L from behind having to turn and catch x 15 reps, alternating toss/catch on each side. More challenge noted with turn/catch on R > L side.  Pt did not notice much of an increase in the dizziness, but overall a little more in general today. No significant veering noted today demonstrating improved balance.  NMR: On thicker blue foam standing with feet apart in corner: completed alternating reaches to R/L to wall x 10 reps each direction with eyes closed.   Single Leg Stance:   Surface:  thick blue dense foam  Lower Extremity: BLE  Time: 10-15 seconds, progressing from eyes open x 2 reps, to eyes closed x 3 reps on BLE  Tandem Stance:  Surface:  thick blue dense foam Completed with: Eyes Open; Head Turns x 10 Reps With full tandem completed eyes open x 30 seconds, then progressing with 3/4th tandem and completed eyes open with horizontal x 10 reps each alternating foot position.    On thicker blue foam completed sit <> stands without UE support x 15 reps with eyes closed.   PATIENT EDUCATION: Education details:Continue HEP  Person educated: Patient Education method: Theatre stage manager Education comprehension: verbalized understanding   HOME EXERCISE PROGRAM: Access Code: [email or web address]    GOALS: Goals reviewed with patient? Yes  SHORT TERM GOALS: ALL STGS = LTGS   Updated Long Term Goals:  LONG TERM GOALS:  TARGET DATE: 01/08/22  Pt will be independent with final HEP for improved balance/coordination. Baseline: reported independence with current HEP, will benefit from progressive HEP Goal status: IN PROGRESS  2.  Pt will improve FGA to >/= 29/30 to demonstrate improved balance and reduced fall risk Baseline: 26/30; 27/30 Goal status: IN PROGRESS  3.  Patient will be able to jog for 10 minutes to allow  for return to PLOF Baseline: pt starting to jog on treadmill; jogging 60' over ground; 3 minutes on treadmill Goal status: REVISED  4.  Pt will improve FOTO to >/= 70% Baseline: 59% (when given at prior eval) Goal status: IN PROGRESS  5.  Pt will score at least an 80 on composite score of SOT and have vestibular score on sensory analysis be WNL in order to demo improved vestibular input for balance.  Baseline: Composite: 80, vestibular below normal limits.  Goal status: IN PROGRESS  6.  Pt will report </= 1/5 for all movements on MSQ to indicate improvement in motion sensitivity and improved activity tolerance.  Baseline: See above Goal status: IN PROGRESS  ASSESSMENT:  CLINICAL IMPRESSION: Today's skilled session focused on continued activities on treadmill adding in dual tasking and visual tracking. Patient having the most challenge with turning to the R. Requiring CGA. Continued standing balance with more focus on narrow BOS and eyes closed. Will continue per POC.    OBJECTIVE IMPAIRMENTS Abnormal gait, decreased activity tolerance, decreased balance, difficulty walking, decreased safety awareness, and dizziness.   ACTIVITY LIMITATIONS community activity, driving, occupation, and yard work.   PERSONAL FACTORS 1-2 comorbidities: 1-2 comorbidities: BPH, hypogonadism, C1 Burst Fx   are also affecting patient's functional outcome  are also affecting patient's functional outcome.    REHAB POTENTIAL: Excellent  CLINICAL DECISION MAKING: Stable/uncomplicated  EVALUATION COMPLEXITY:  Low  PLAN: PT FREQUENCY: 2x/week  PT DURATION: 8 weeks  PLANNED INTERVENTIONS: Therapeutic exercises, Therapeutic activity, Neuromuscular re-education, Balance training, Gait training, Patient/Family education, Joint mobilization, Stair training, Vestibular training, DME instructions, Aquatic Therapy, Electrical stimulation, Cryotherapy, Moist heat, and Manual therapy  PLAN FOR NEXT SESSION: Monitor BP. Will need to begin to check LTGs. Progress Habituation as needed. Trampoline activities Continue to review prior HEP and update as needed. Balance for incr vestibular input.    Jones Bales, PT, DPT  12/23/2021, 8:05 AM

## 2021-12-28 ENCOUNTER — Ambulatory Visit: Payer: BC Managed Care – PPO

## 2021-12-28 VITALS — BP 134/94 | HR 48

## 2021-12-28 DIAGNOSIS — R2681 Unsteadiness on feet: Secondary | ICD-10-CM

## 2021-12-28 DIAGNOSIS — R42 Dizziness and giddiness: Secondary | ICD-10-CM | POA: Diagnosis not present

## 2021-12-28 DIAGNOSIS — R2689 Other abnormalities of gait and mobility: Secondary | ICD-10-CM

## 2021-12-28 DIAGNOSIS — R262 Difficulty in walking, not elsewhere classified: Secondary | ICD-10-CM

## 2021-12-30 ENCOUNTER — Ambulatory Visit: Payer: BC Managed Care – PPO

## 2021-12-30 VITALS — BP 124/92 | HR 54

## 2021-12-30 DIAGNOSIS — R42 Dizziness and giddiness: Secondary | ICD-10-CM

## 2021-12-30 DIAGNOSIS — R2681 Unsteadiness on feet: Secondary | ICD-10-CM

## 2021-12-30 DIAGNOSIS — R2689 Other abnormalities of gait and mobility: Secondary | ICD-10-CM

## 2021-12-30 DIAGNOSIS — R262 Difficulty in walking, not elsewhere classified: Secondary | ICD-10-CM

## 2021-12-30 NOTE — Therapy (Signed)
OUTPATIENT PHYSICAL THERAPY NEURO TREATMENT/DISCHARGE SUMMARY   Patient Name: Christopher Pugh MRN: [id number] DOB:1971/06/17, 51 y.o., male Today's Date: 12/30/2021  PCP: Chesley Noon, MD REFERRING PROVIDER: Pedro Earls, MD (pt being followed by Dr. Leta Baptist)   [place]  Visits from Start of Care: 13  Current functional level related to goals / functional outcomes: See Clinical Impression Statement   Remaining deficits: Dizziness   Education / Equipment: HEP provided   Patient agrees to discharge. Patient goals were met. Patient is being discharged due to meeting the stated rehab goals.   PT End of Session - 12/30/21 0718     Visit Number 13    Number of Visits 17    Date for PT Re-Evaluation 01/19/22    Authorization Type BCBS    PT Start Time 0717    PT Stop Time 0755    PT Time Calculation (min) 38 min    Activity Tolerance Patient tolerated treatment well    Behavior During Therapy WFL for tasks assessed/performed             Past Medical History:  Diagnosis Date   BPH without obstruction/lower urinary tract symptoms    Closed head injury with concussion    x 2   CVA (cerebral vascular accident) ([place]) 10/19/2021   DIzzy, off balance   Headache    Hypogonadism in male    Medical history non-contributory    Past Surgical History:  Procedure Laterality Date   ELBOW SURGERY Right 06/2021   IR ANGIO VERTEBRAL SEL VERTEBRAL BILAT MOD SED  11/01/2021   IR ANGIO VERTEBRAL SEL VERTEBRAL UNI R MOD SED  11/04/2021   IR ANGIOGRAM EXTREMITY RIGHT  11/04/2021   IR CT HEAD LTD  10/19/2021   IR INTRA CRAN STENT  11/04/2021   IR PERCUTANEOUS ART THROMBECTOMY/INFUSION INTRACRANIAL INC DIAG ANGIO  10/19/2021   IR US GUIDE VASC ACCESS RIGHT  10/19/2021   IR US GUIDE VASC ACCESS RIGHT  11/04/2021   RADIOLOGY WITH ANESTHESIA N/A 10/19/2021   Procedure: IR WITH ANESTHESIA;  Surgeon: Radiologist, Medication, MD;  Location: [place];   Service: Radiology;  Laterality: N/A;   RADIOLOGY WITH ANESTHESIA N/A 11/04/2021   Procedure: ANGIOPLASTY/STENTING;  Surgeon: Luanne Bras, MD;  Location: [place];  Service: Radiology;  Laterality: N/A;   Patient Active Problem List   Diagnosis Date Noted   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   TIA (transient ischemic attack) 10/30/2021   HLD (hyperlipidemia) 10/30/2021   Cerebrovascular accident (CVA) ([place]) 10/19/2021   BPH without obstruction/lower urinary tract symptoms 10/19/2021   Hypogonadism in male 10/19/2021   Alcohol abuse 10/19/2021   C1 cervical fracture ([place]) 10/19/2021    ONSET DATE: 11/05/2021 (date of referral)  REFERRING DIAG:  I63.211 (ICD-10-CM) - Cerebrovascular accident (CVA) due to stenosis of right vertebral artery ([place])   THERAPY DIAG:  Dizziness and giddiness  Unsteadiness on feet  Other abnormalities of gait and mobility  Difficulty in walking, not elsewhere classified  SUBJECTIVE:  SUBJECTIVE STATEMENT: Has been doing more at school, been on the tractor. No issues reported. Baseline dizziness this morning.  Pt accompanied by: self   PERTINENT HISTORY: PERTINENT HISTORY: BPH, hypogonadism, C1 Burst Fx  51 y.o. 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  He was discharged on 11/01/2021 for outpatient follow-up.  Patient had right vertebral artery stenting on 07/05/313, without complication.  He was discharged on aspirin and Plavix.     PAIN:  Are you having pain? No Vitals:   12/30/21 0722  BP: (!) 124/92  Pulse: (!) 54    PRECAUTIONS: Other: No driving.  No heavy lifting, no upside down.   PLOF: Systems analyst at [employer] ; Run 4-5 miles/week couple times a week  PATIENT GOALS Wants to get back to the best that he can.    OBJECTIVE:  GAIT: Gait pattern: step through pattern and wide BOS Distance walked: clinic distance with activities Assistive device utilized: None Level of assistance: Mod I   TREATMENT:    Completed warm up on level 3.0 mph x 2 minutes, then completed jogging on treadmill at 5.0 mph x 10 minutes. Followed by 1 minute cool down at 3.3 mph Patient tolerating well, only mild dizziness. No veering noted today.     [facility] PT Assessment - 12/30/21 0001       Functional Gait  Assessment   Gait assessed  Yes    Gait Level Surface Walks 20 ft in less than 5.5 sec, no assistive devices, good speed, no evidence for imbalance, normal gait pattern, deviates no more than 6 in outside of the 12 in walkway width.    Change in Gait Speed Able to smoothly change walking speed without loss of balance or gait deviation. Deviate no more than 6 in outside of the 12 in walkway width.    Gait with Horizontal Head Turns Performs head turns smoothly with no change in gait. Deviates no more than 6 in outside 12 in walkway width    Gait with Vertical Head Turns Performs head turns with no change in gait. Deviates no more than 6 in outside 12 in walkway width.    Gait and Pivot Turn Pivot turns safely within 3 sec and stops quickly with no loss of balance.    Step Over Obstacle Is able to step over 2 stacked shoe boxes taped together (9 in total height) without changing gait speed. No evidence of imbalance.    Gait with Narrow Base of Support Is able to ambulate for 10 steps heel to toe with no staggering.    Gait with Eyes Closed Walks 20 ft, uses assistive device, slower speed, mild gait  deviations, deviates 6-10 in outside 12 in walkway width. Ambulates 20 ft in less than 9 sec but greater than 7 sec.    Ambulating Backwards Walks 20 ft, no assistive devices, good speed, no evidence for imbalance, normal gait    Steps Alternating feet, no rail.    Total Score 29            Reviewed Current HEP and educated on continued compliance upon d/c.   PATIENT EDUCATION: Education details:Progress toward LTGs Person educated: Patient Education method: Theatre stage manager Education comprehension: verbalized understanding   HOME EXERCISE PROGRAM:  Access Code: [email or web address]  GOALS: Goals reviewed with patient? Yes  SHORT TERM GOALS: ALL STGS = LTGS   Updated Long Term Goals:  LONG TERM GOALS: TARGET DATE: 01/08/22  Pt will be independent with final HEP for improved balance/coordination. Baseline: reported independence with current HEP, will benefit from progressive HEP Goal status: MET  2.  Pt will improve FGA to >/= 29/30 to demonstrate improved balance and reduced fall risk Baseline: 26/30; 27/30; 29/30 Goal status: MET  3.  Patient will be able to jog for 10 minutes to allow for return to PLOF Baseline: pt starting to jog on treadmill; jogging 60' over ground; 3 minutes on treadmill; able to jog 10 minutes consecutively on treadmill (approx 1 mile).  Goal status: MET  4.  Pt will improve FOTO to >/= 70% Baseline: 59% (when given at prior eval); 78% Goal status: MET  5.  Pt will score at least an 80 on composite score of SOT and have vestibular score on sensory analysis be WNL in order to demo improved vestibular input for balance.  Baseline: Composite: 80, vestibular below normal limits; Composite: 84%, above age norms Goal status: MET  6.  Pt will report </= 1/5 for all movements on MSQ to indicate improvement in motion sensitivity and improved activity tolerance.  Baseline: See above; 1-2/5 Goal status: PARTIALLY MET  ASSESSMENT:  CLINICAL  IMPRESSION: Today's skilled session focused on finishing assessment of patient's progress toward LTGs. Patient able to meet all LTGs demonstrating improved tolerance for activities, improved dizziness, and improved balance. Patient demo readiness to d/c from PT services at this time due to significant progress made. Patient agreeable to d/c.    OBJECTIVE IMPAIRMENTS Abnormal gait, decreased activity tolerance, decreased balance, difficulty walking, decreased safety awareness, and dizziness.   ACTIVITY LIMITATIONS community activity, driving, occupation, and yard work.   PERSONAL FACTORS 1-2 comorbidities: 1-2 comorbidities: BPH, hypogonadism, C1 Burst Fx   are also affecting patient's functional outcome  are also affecting patient's functional outcome.    REHAB POTENTIAL: Excellent  CLINICAL DECISION MAKING: Stable/uncomplicated  EVALUATION COMPLEXITY: Low  PLAN: PT FREQUENCY: 2x/week  PT DURATION: 8 weeks  PLANNED INTERVENTIONS: Therapeutic exercises, Therapeutic activity, Neuromuscular re-education, Balance training, Gait training, Patient/Family education, Joint mobilization, Stair training, Vestibular training, DME instructions, Aquatic Therapy, Electrical stimulation, Cryotherapy, Moist heat, and Manual therapy  PLAN FOR NEXT SESSION: d/c this visit   Jones Bales, PT, DPT  12/30/2021, 8:05 AM

## 2022-01-26 ENCOUNTER — Other Ambulatory Visit: Payer: Self-pay

## 2022-01-26 NOTE — Patient Outreach (Signed)
[facility]) Care Management  01/26/2022  Christopher Pugh 01-12-1971 [id number]   Telephone outreach to patient to obtain mRS was successfully completed. MRS= Heath Management Assistant [phone]

## 2022-03-26 ENCOUNTER — Ambulatory Visit: Payer: Self-pay

## 2022-03-26 ENCOUNTER — Encounter: Payer: Self-pay | Admitting: Sports Medicine

## 2022-03-26 ENCOUNTER — Ambulatory Visit (INDEPENDENT_AMBULATORY_CARE_PROVIDER_SITE_OTHER): Payer: BC Managed Care – PPO | Admitting: Sports Medicine

## 2022-03-26 ENCOUNTER — Ambulatory Visit (INDEPENDENT_AMBULATORY_CARE_PROVIDER_SITE_OTHER): Payer: BC Managed Care – PPO

## 2022-03-26 VITALS — Ht 69.0 in | Wt 195.0 lb

## 2022-03-26 DIAGNOSIS — M25551 Pain in right hip: Secondary | ICD-10-CM

## 2022-03-26 DIAGNOSIS — M25552 Pain in left hip: Secondary | ICD-10-CM

## 2022-03-26 NOTE — Progress Notes (Signed)
Right hip pain worse than left. Greater troch pain. No known injury; denies numbness/tingling IT band tightness

## 2022-03-26 NOTE — Progress Notes (Signed)
Christopher Pugh - 51 y.o. male MRN [id number]  Date of birth: 1971/03/16  Office Visit Note: Visit Date: 03/26/2022 PCP: Chesley Noon, MD Referred by: Chesley Noon, MD  Subjective: Chief Complaint  Patient presents with   Left Hip - Pain   Right Hip - Pain   HPI: Christopher Pugh is a very pleasant 51 y.o. male who presents today for bilateral lateral hip pain, R > L.   Points to pain over the greater trochanteric area.  This has been bothering him for some time now and has recently been exacerbated. He changed jobs and now does a lot of standing which worsens the pain.  He denies any specific injury.  No radicular pain down the leg.  He does have some tightness about the hips and so has been doing IT band stretching as well as foam rolling over the lateral hip.  This will hurt when he does it, but it feels better following this.  He has taken some Tylenol and very occasional ibuprofen, although is not supposed to take this consistently given Plavix use, so he does not.  His right hip is much worse than the left hip.  He has pain with prolonged standing or walking.  He is able to get into a deep squat and in this position it does not bother him but with certain bending motions he will feel sharp pain.  No redness or swelling.  Pertinent ROS were reviewed with the patient and found to be negative unless otherwise specified above in HPI.   Assessment & Plan: Visit Diagnoses:  1. Greater trochanteric pain syndrome of both lower extremities   2. Pain in left hip   3. Pain in right hip    Plan: I had a good discussion with Kolson today regarding the etiology of his hip pain.  His pain localizes to the lateral hip near the greater trochanter.  He is a very fit individual, but his lateral hip abductors (gluteus medius, etc.) are weak on the right side compared to the left side.  This is where his pain is emanating from, likely from muscle imbalance.  He does have some tightness over the IT  band as well although I think this is a secondary driver of his pain.  We did give him a home exercise series for hip stabilization and lateral hip abduction strengthening.  We did provide some stretches for the IT band such as pretzel stretch and figure 4.  He will do these once daily.  Given the degree of his pain, we did elect to proceed with ultrasound-guided greater trochanteric injection, patient tolerated well.  He may use ice and/or Tylenol for any postinjection pain.  I would expect his pain and function to improve sometime over the next 4 to 6 weeks, he may follow-up for evaluation at this time.  If he desires left-sided greater trochanteric injection, would schedule this sometime over the next 2-4 weeks.  If he is doing great, may continue the home exercises and follow-up as needed.  Follow-up: PRN --> may do left hip GT inj in 3-4 weeks if desires   Meds & Orders: No orders of the defined types were placed in this encounter.   Orders Placed This Encounter  Procedures   XR HIPS BILAT W OR W/O PELVIS 3-4 VIEWS   US Guided Needle Placement - No Linked Charges     Procedures: US-Guided Greater Trochanteric Bursa Injection, right After discussion on risks/benefits/indications and informed verbal  consent was obtained, a timeout was performed. The patient was lying in lateral recumbent position on exam table. Using ultrasound guidance, the greater trochanter was identified. The area overlying the trochanteric bursa was then prepped with Betadine and alcohol swabs. Following sterile precautions, ultrasound was reapplied to visualize needle guidance with a 22-gauge 3.5" needle utilizing an in-plane approach to inject the bursa with 3:1 bupivicaine:betamethasone. Delivery of the injectate was visualized into the region of hypoechoic fluid of the greater trochanteric bursa. Patient tolerated procedure well without immediate complications.         Clinical History: No specialty comments available.   He reports that he has never smoked. He has quit using smokeless tobacco.  Recent Labs    10/19/21 1120  HGBA1C 5.3    Objective:   Vital Signs: Ht '5\' 9"'$  (1.753 m)   Wt 195 lb (88.5 kg)   BMI 28.80 kg/m   Physical Exam  Gen: Well-appearing, in no acute distress; non-toxic CV: Regular Rate. Well-perfused. Warm.  Resp: Breathing unlabored on room air; no wheezing. Psych: Fluid speech in conversation; appropriate affect; normal thought process Neuro: Sensation intact throughout. No gross coordination deficits.   Ortho Exam  -Bilateral hips: Notable TTP over the posterior aspect of the right greater trochanter, only mild TTP on the left greater trochanter.  No ASIS or SI joint TTP.  SPECT of the hip demonstrates no erythema, overlying rash or swelling.  Negative passive logroll with internal and external rotation.  Positive FABER test on the right, negative on the left.  No pain or bony block with FADIR testing.  There is some mild tightness with hip flexion of the right hip.  Negative Ober's and Noble's compression test.  Very mild Trendelenburg of the right hip upon ambulation.  Imaging: XR HIPS BILAT W OR W/O PELVIS 3-4 VIEWS  Result Date: 03/26/2022 3 views of bilateral hips including AP pelvis, frog-leg and lateral views were ordered and reviewed by myself.  X-rays show no acute fracture noted.  There is small spurring off the right acetabular rim.  Relatively well-preserved joint spaces, minimal to mild OA.    Past Medical/Family/Surgical/Social History: Medications & Allergies reviewed per EMR, new medications updated. 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   TIA (transient ischemic attack) 10/30/2021   HLD (hyperlipidemia) 10/30/2021   Cerebrovascular accident (CVA) ([place]) 10/19/2021   BPH without obstruction/lower urinary tract symptoms 10/19/2021   Hypogonadism in male 10/19/2021   Alcohol abuse 10/19/2021    C1 cervical fracture ([place]) 10/19/2021   Past Medical History:  Diagnosis Date   BPH without obstruction/lower urinary tract symptoms    Closed head injury with concussion    x 2   CVA (cerebral vascular accident) ([place]) 10/19/2021   DIzzy, off balance   Headache    Hypogonadism in male    Medical history non-contributory    Family History  Problem Relation Age of Onset   Ovarian cancer Mother    Hypertension Father    Stroke Paternal Aunt    Past Surgical History:  Procedure Laterality Date   ELBOW SURGERY Right 06/2021   IR ANGIO VERTEBRAL SEL VERTEBRAL BILAT MOD SED  11/01/2021   IR ANGIO VERTEBRAL SEL VERTEBRAL UNI R MOD SED  11/04/2021   IR ANGIOGRAM EXTREMITY RIGHT  11/04/2021   IR CT HEAD LTD  10/19/2021   IR INTRA CRAN STENT  11/04/2021   IR PERCUTANEOUS ART THROMBECTOMY/INFUSION INTRACRANIAL INC DIAG ANGIO  10/19/2021   IR US GUIDE VASC ACCESS RIGHT  10/19/2021   IR US GUIDE VASC ACCESS RIGHT  11/04/2021   RADIOLOGY WITH ANESTHESIA N/A 10/19/2021   Procedure: IR WITH ANESTHESIA;  Surgeon: Radiologist, Medication, MD;  Location: [place];  Service: Radiology;  Laterality: N/A;   RADIOLOGY WITH ANESTHESIA N/A 11/04/2021   Procedure: ANGIOPLASTY/STENTING;  Surgeon: Luanne Bras, MD;  Location: [place];  Service: Radiology;  Laterality: N/A;   Social History   Occupational History   Occupation: Teacher, early years/pre    Comment: [employer]  Tobacco Use   Smoking status: Never   Smokeless tobacco: Former  Scientific laboratory technician Use: Never used  Substance and Sexual Activity   Alcohol use: Not Currently   Drug use: Not Currently    Types: Marijuana    Comment: ocassional maijuana gummie- last time- early April 2023   Sexual activity: Not on file

## 2022-04-12 ENCOUNTER — Ambulatory Visit: Payer: BC Managed Care – PPO | Admitting: Diagnostic Neuroimaging

## 2022-04-26 ENCOUNTER — Telehealth (HOSPITAL_COMMUNITY): Payer: Self-pay

## 2022-04-26 ENCOUNTER — Other Ambulatory Visit (HOSPITAL_COMMUNITY): Payer: Self-pay | Admitting: Neuroradiology

## 2022-04-26 DIAGNOSIS — I7774 Dissection of vertebral artery: Secondary | ICD-10-CM

## 2022-04-26 NOTE — Telephone Encounter (Signed)
Called to schedule diagnostic angiogram with Dr. Debbrah Alar, no answer, left vm. AW

## 2022-05-03 ENCOUNTER — Other Ambulatory Visit: Payer: Self-pay | Admitting: Student

## 2022-05-03 DIAGNOSIS — I771 Stricture of artery: Secondary | ICD-10-CM

## 2022-05-04 ENCOUNTER — Other Ambulatory Visit (HOSPITAL_COMMUNITY): Payer: Self-pay | Admitting: Neuroradiology

## 2022-05-04 ENCOUNTER — Ambulatory Visit (HOSPITAL_COMMUNITY)
Admission: RE | Admit: 2022-05-04 | Discharge: 2022-05-04 | Disposition: A | Discharge status: Home / Self Care | Payer: BC Managed Care – PPO | Source: Ambulatory Visit | Attending: Neuroradiology | Admitting: Neuroradiology

## 2022-05-04 ENCOUNTER — Encounter (HOSPITAL_COMMUNITY): Payer: Self-pay

## 2022-05-04 ENCOUNTER — Other Ambulatory Visit: Payer: Self-pay

## 2022-05-04 DIAGNOSIS — Z8673 Personal history of transient ischemic attack (TIA), and cerebral infarction without residual deficits: Secondary | ICD-10-CM | POA: Insufficient documentation

## 2022-05-04 DIAGNOSIS — I6501 Occlusion and stenosis of right vertebral artery: Secondary | ICD-10-CM | POA: Insufficient documentation

## 2022-05-04 DIAGNOSIS — I7774 Dissection of vertebral artery: Secondary | ICD-10-CM

## 2022-05-04 DIAGNOSIS — I771 Stricture of artery: Secondary | ICD-10-CM

## 2022-05-04 DIAGNOSIS — Z7982 Long term (current) use of aspirin: Secondary | ICD-10-CM | POA: Insufficient documentation

## 2022-05-04 DIAGNOSIS — Z7902 Long term (current) use of antithrombotics/antiplatelets: Secondary | ICD-10-CM | POA: Insufficient documentation

## 2022-05-04 HISTORY — PX: IR ANGIO VERTEBRAL SEL SUBCLAVIAN INNOMINATE UNI R MOD SED: IMG5365

## 2022-05-04 HISTORY — PX: IR ANGIO VERTEBRAL SEL VERTEBRAL UNI L MOD SED: IMG5367

## 2022-05-04 HISTORY — PX: IR ANGIO EXTRACRAN SEL COM CAROTID INNOMINATE UNI R MOD SED: IMG5356

## 2022-05-04 HISTORY — PX: IR ANGIO INTRA EXTRACRAN SEL INTERNAL CAROTID UNI L MOD SED: IMG5361

## 2022-05-04 HISTORY — PX: IR US GUIDE VASC ACCESS RIGHT: IMG2390

## 2022-05-04 LAB — CBC
HCT: 36.9 % — ABNORMAL LOW (ref 39.0–52.0)
Hemoglobin: 13.3 g/dL (ref 13.0–17.0)
MCH: 32.8 pg (ref 26.0–34.0)
MCHC: 36 g/dL (ref 30.0–36.0)
MCV: 90.9 fL (ref 80.0–100.0)
Platelets: 186 10*3/uL (ref 150–400)
RBC: 4.06 MIL/uL — ABNORMAL LOW (ref 4.22–5.81)
RDW: 11.5 % (ref 11.5–15.5)
WBC: 3.1 10*3/uL — ABNORMAL LOW (ref 4.0–10.5)
nRBC: 0 % (ref 0.0–0.2)

## 2022-05-04 LAB — PROTIME-INR
INR: 1 (ref 0.8–1.2)
Prothrombin Time: 12.9 seconds (ref 11.4–15.2)

## 2022-05-04 MED ORDER — VERAPAMIL HCL 2.5 MG/ML IV SOLN: INTRAVENOUS | Status: AC | PRN

## 2022-05-04 MED ORDER — SODIUM CHLORIDE 0.9 % IV SOLN: INTRAVENOUS | Status: DC

## 2022-05-04 MED ORDER — LIDOCAINE HCL 1 % IJ SOLN
INTRAMUSCULAR | Status: AC
  Filled 2022-05-04: qty 20

## 2022-05-04 MED ORDER — FENTANYL CITRATE (PF) 100 MCG/2ML IJ SOLN
INTRAMUSCULAR | Status: AC
  Filled 2022-05-04: qty 2

## 2022-05-04 MED ORDER — IOHEXOL 300 MG/ML  SOLN
100.0000 mL | Freq: Once | INTRAMUSCULAR | Status: AC | PRN
  Administered 2022-05-04: 25 mL via INTRA_ARTERIAL

## 2022-05-04 MED ORDER — IOHEXOL 300 MG/ML  SOLN
100.0000 mL | Freq: Once | INTRAMUSCULAR | Status: AC | PRN
  Administered 2022-05-04: 50 mL via INTRA_ARTERIAL

## 2022-05-04 MED ORDER — FENTANYL CITRATE (PF) 100 MCG/2ML IJ SOLN
INTRAMUSCULAR | Status: AC | PRN
  Administered 2022-05-04: 25 ug via INTRAVENOUS

## 2022-05-04 MED ORDER — MIDAZOLAM HCL 2 MG/2ML IJ SOLN
INTRAMUSCULAR | Status: AC
  Filled 2022-05-04: qty 2

## 2022-05-04 MED ORDER — HEPARIN SODIUM (PORCINE) 1000 UNIT/ML IJ SOLN
INTRAMUSCULAR | Status: AC
  Filled 2022-05-04: qty 10

## 2022-05-04 MED ORDER — MIDAZOLAM HCL 2 MG/2ML IJ SOLN
INTRAMUSCULAR | Status: AC | PRN
  Administered 2022-05-04: 1 mg via INTRAVENOUS

## 2022-05-04 MED ORDER — NITROGLYCERIN 1 MG/10 ML FOR IR/CATH LAB
INTRA_ARTERIAL | Status: AC
  Filled 2022-05-04: qty 10

## 2022-05-04 MED ORDER — VERAPAMIL HCL 2.5 MG/ML IV SOLN
INTRAVENOUS | Status: AC
  Filled 2022-05-04: qty 2

## 2022-05-04 NOTE — Sedation Documentation (Signed)
Right radial sheath rmoved, TR band applied with 8cc of air at 1015

## 2022-05-04 NOTE — Procedures (Signed)
INTERVENTIONAL NEURORADIOLOGY BRIEF POSTPROCEDURE NOTE  DIAGNOSTIC CEREBRAL ANGIOGRAM    Attending: Dr. Pedro Earls   Diagnosis: Right vertebral artery dissection status post stenting    Access site: Distal right radial artery    Access closure: Inflatable band    Anesthesia: Moderate sedation    Medication used: 1 mg Versed IV; 25 mcg Fentanyl IV.   Complications: None    Estimated blood loss: None    Specimen: None    Findings: Occlusion of the right vertebral artery with a no flow past the V1 segment seen on right subclavian artery injection.  Normal caliber of the left vertebral artery with normal opacification of the basilar artery.  Retrograde opacification of a small intracranial right vertebral artery is seen.  Unremarkable anterior circulation.    The patient tolerated the procedure well without incident or complication and is in stable condition.  He may discontinue the Plavix while remaining on aspirin 81 mg q.d.Marland Kitchen

## 2022-05-04 NOTE — H&P (Signed)
Chief Complaint: Patient was seen in consultation today for cerebral arteriogram at the request of Dr Lavera Guise  Supervising Physician: Pedro Earls  Patient Status: [facility] - Out-pt  History of Present Illness: Christopher Pugh is a 51 y.o. male   Known to NIR Successful and uncomplicated right vertebral artery stenting at the distal V2 segment for treatment of symptomatic dissection. May 2023 Hx CVA Scheduled today for follow up arteriogram Denies any symptoms Neurologically intact Taking ASA/ Plavix daily   Past Medical History:  Diagnosis Date   BPH without obstruction/lower urinary tract symptoms    Closed head injury with concussion    x 2   CVA (cerebral vascular accident) ([place]) 10/19/2021   DIzzy, off balance   Headache    Hypogonadism in male    Medical history non-contributory     Past Surgical History:  Procedure Laterality Date   ELBOW SURGERY Right 06/2021   IR ANGIO VERTEBRAL SEL VERTEBRAL BILAT MOD SED  11/01/2021   IR ANGIO VERTEBRAL SEL VERTEBRAL UNI R MOD SED  11/04/2021   IR ANGIOGRAM EXTREMITY RIGHT  11/04/2021   IR CT HEAD LTD  10/19/2021   IR INTRA CRAN STENT  11/04/2021   IR PERCUTANEOUS ART THROMBECTOMY/INFUSION INTRACRANIAL INC DIAG ANGIO  10/19/2021   IR US GUIDE VASC ACCESS RIGHT  10/19/2021   IR US GUIDE VASC ACCESS RIGHT  11/04/2021   RADIOLOGY WITH ANESTHESIA N/A 10/19/2021   Procedure: IR WITH ANESTHESIA;  Surgeon: Radiologist, Medication, MD;  Location: [place];  Service: Radiology;  Laterality: N/A;   RADIOLOGY WITH ANESTHESIA N/A 11/04/2021   Procedure: ANGIOPLASTY/STENTING;  Surgeon: Luanne Bras, MD;  Location: [place];  Service: Radiology;  Laterality: N/A;    Allergies: Patient has no known allergies.  Medications: Prior to Admission medications   Medication Sig Start Date End Date Taking? Authorizing Provider  acetaminophen (TYLENOL) 500 MG tablet Take 1,000 mg by mouth every 6 (six) hours as needed for moderate pain.    Yes [provider]  aspirin 81 MG EC tablet Take 1 tablet (81 mg total) by mouth daily. Swallow whole. 10/22/21  Yes Rosalin Hawking, MD  clopidogrel (PLAVIX) 75 MG tablet Take 1 tablet (75 mg total) by mouth daily. 11/13/21  Yes Penumalli, Earlean Polka, MD  Multiple Vitamin (MULTIVITAMIN) tablet Take 1 tablet by mouth daily.   Yes [provider]  rosuvastatin (CRESTOR) 40 MG tablet Take 1 tablet (40 mg total) by mouth daily. 11/13/21  Yes Penumalli, Earlean Polka, MD  tadalafil (CIALIS) 5 MG tablet Take 5 mg by mouth daily. 11/13/19  Yes [provider]     Family History  Problem Relation Age of Onset   Ovarian cancer Mother    Hypertension Father    Stroke Paternal Aunt     Social History   Socioeconomic History   Marital status: Married    Spouse name: Not on file   Number of children: Not on file   Years of education: Not on file   Highest education level: Not on file  Occupational History   Occupation: Teacher, early years/pre    Comment: [employer]  Tobacco Use   Smoking status: Never   Smokeless tobacco: Former  Scientific laboratory technician Use: Never used  Substance and Sexual Activity   Alcohol use: Not Currently   Drug use: Not Currently    Types: Marijuana    Comment: ocassional maijuana gummie- last time- early April 2023   Sexual activity:  Not on file  Other Topics Concern   Not on file  Social History Narrative   Not on file   Social Determinants of Health   Financial Resource Strain: Not on file  Food Insecurity: Not on file  Transportation Needs: Not on file  Physical Activity: Not on file  Stress: Not on file  Social Connections: Not on file    Review of Systems: A 12 point ROS discussed and pertinent positives are indicated in the HPI above.  All other systems are negative.  Review of Systems  Constitutional:  Negative for activity change, fatigue and fever.  HENT:  Negative for tinnitus and voice change.   Eyes:  Negative for  visual disturbance.  Respiratory:  Negative for cough and shortness of breath.   Cardiovascular:  Negative for chest pain.  Gastrointestinal:  Negative for abdominal pain.  Musculoskeletal:  Negative for back pain and gait problem.  Neurological:  Negative for dizziness, tremors, seizures, syncope, facial asymmetry, speech difficulty, weakness, light-headedness, numbness and headaches.  Psychiatric/Behavioral:  Negative for behavioral problems and confusion.     Vital Signs: BP (!) 128/99   Pulse 62   Temp (!) 97.5 F (36.4 C) (Tympanic)   Resp 18   Ht '5\' 8"'$  (1.727 m)   Wt 200 lb (90.7 kg)   BMI 30.41 kg/m     Physical Exam Vitals reviewed.  HENT:     Mouth/Throat:     Mouth: Mucous membranes are moist.  Cardiovascular:     Rate and Rhythm: Normal rate and regular rhythm.     Heart sounds: Normal heart sounds.  Pulmonary:     Effort: Pulmonary effort is normal.     Breath sounds: Normal breath sounds.  Abdominal:     Palpations: Abdomen is soft.  Musculoskeletal:        General: Normal range of motion.     Right lower leg: No edema.     Left lower leg: No edema.  Skin:    General: Skin is warm.  Neurological:     Mental Status: He is alert and oriented to person, place, and time.  Psychiatric:        Behavior: Behavior normal.     Imaging: No results found.  Labs:  CBC: Recent Labs    10/30/21 1418 10/30/21 1434 10/30/21 1550 11/04/21 0627 05/04/22 0701  WBC 6.7  --  6.2 3.8* 3.1*  HGB 15.2 14.6 13.8 12.6* 13.3  HCT 41.2 43.0 38.2* 36.4* 36.9*  PLT 241  --  218 189 186    COAGS: Recent Labs    10/19/21 0740 10/30/21 1418 11/04/21 0627  INR 1.0 1.0 1.0  APTT 25 28  --     BMP: Recent Labs    10/19/21 0740 10/19/21 0751 10/21/21 0402 10/30/21 1418 10/30/21 1434 10/30/21 1550 11/04/21 0627  NA 136   < > 137 136 138  --  142  K 4.2   < > 3.9 3.9 4.0  --  4.3  CL 106   < > 108 105 104  --  110  CO2 21*  --  23 23  --   --  24   GLUCOSE 175*   < > 106* 105* 105*  --  109*  BUN 18   < > 12 25* 27*  --  24*  CALCIUM 9.2  --  8.8* 9.9  --   --  9.2  CREATININE 1.07   < > 1.05 1.25* 1.30* 1.30* 1.14  GFRNONAA >60  --  >60 >60  --  >60 >60   < > = values in this interval not displayed.    LIVER FUNCTION TESTS: Recent Labs    10/19/21 0740 10/30/21 1418  BILITOT 1.5* 0.9  AST 27 33  ALT 23 36  ALKPHOS 42 43  PROT 6.3* 7.2  ALBUMIN 4.0 4.6    TUMOR MARKERS: No results for input(s): "AFPTM", "CEA", "CA199", "CHROMGRNA" in the last 8760 hours.  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  This interventional procedure involves the use of X-rays and because of the nature of the planned procedure, it is possible that we will have prolonged use of X-ray fluoroscopy.  Potential radiation risks to you include (but are not limited to) the following: - A slightly elevated risk for cancer  several years later in life. This risk is typically less than 0.5% percent. This risk is low in comparison to the normal incidence of human cancer, which is 33% for women and 50% for men according to the [employer]. - Radiation induced injury can include skin redness, resembling a rash, tissue breakdown / ulcers and hair loss (which can be temporary or permanent).   The likelihood of either of these occurring depends on the difficulty of the procedure and whether you are sensitive to radiation due to previous procedures, disease, or genetic conditions.   IF your procedure requires a prolonged use of radiation, you will be notified and given written instructions for further action.   It is your responsibility to monitor the irradiated area for the 2 weeks following the procedure and to notify your physician if you are concerned that you have suffered a radiation induced injury.    All of the patient's questions were answered, patient is agreeable to proceed.  Consent signed and in chart.   Thank you for this interesting consult.  I greatly enjoyed meeting Christopher Pugh and look forward to participating in their care.  A copy of this report was sent to the requesting provider on this date.  Electronically Signed: Lavonia Drafts, PA-C 05/04/2022, 7:55 AM   I spent a total of    25 Minutes in face to face in clinical consultation, greater than 50% of which was counseling/coordinating care for Cerebral arteriogram

## 2022-06-15 ENCOUNTER — Ambulatory Visit (INDEPENDENT_AMBULATORY_CARE_PROVIDER_SITE_OTHER): Payer: BC Managed Care – PPO | Admitting: Sports Medicine

## 2022-06-15 ENCOUNTER — Encounter: Payer: Self-pay | Admitting: Sports Medicine

## 2022-06-15 DIAGNOSIS — M25551 Pain in right hip: Secondary | ICD-10-CM | POA: Diagnosis not present

## 2022-06-15 DIAGNOSIS — M9905 Segmental and somatic dysfunction of pelvic region: Secondary | ICD-10-CM

## 2022-06-15 DIAGNOSIS — M25552 Pain in left hip: Secondary | ICD-10-CM

## 2022-06-15 DIAGNOSIS — M24551 Contracture, right hip: Secondary | ICD-10-CM | POA: Diagnosis not present

## 2022-06-15 MED ORDER — CELECOXIB 200 MG PO CAPS: 200.0000 mg | ORAL_CAPSULE | Freq: Every day | ORAL | 0 refills | Status: AC

## 2022-06-15 NOTE — Progress Notes (Signed)
PTBilateral hip pain; injections did good, but pain has come back. Primarily in groin area He is doing the exercises religiously and other exercises on his own  Right is worse than the left

## 2022-06-15 NOTE — Progress Notes (Signed)
Christopher Pugh - 51 y.o. male MRN [id number]  Date of birth: June 23, 1971  Office Visit Note: Visit Date: 06/15/2022 PCP: Chesley Noon, MD Referred by: Chesley Noon, MD  Subjective: Chief Complaint  Patient presents with   Right Hip - Follow-up, Pain   Left Hip - Follow-up, Pain   HPI: Christopher Pugh is a pleasant 51 y.o. male who presents today for bilateral hip pain, right > left.   Lateral hip pain/GT pain syndrome - lateral hips have markedly improved. At last visit, did perform US-guided GT-injection on the right. Felt way better day or two later.  He has been adamant about performing hip exercises daily.  Has noticed great improvement in pain and his strength.  Has been able to return back to the gym as well.  Unfortunately, the lateral hips has gotten better but his pain is now been exacerbated in the anterior part of the hips, right is worse than the left.  He denies any clicking or catching about the hip joint.  His pain is worse after long periods of standing, does get soreness first thing in the morning.  His pain will improve with stretching and hip exercises that he performs in the morning before activity.  No Numbness and tingling. No radiating pain into the groin.  Patient was recently taken off of Plavix -medical team decided this is not needed anymore after recent angiogram.   Pertinent ROS were reviewed with the patient and found to be negative unless otherwise specified above in HPI.   Assessment & Plan: Visit Diagnoses:  1. Bilateral hip pain   2. Right hip flexor tightness   3. Segmental and somatic dysfunction of pelvic region   4. Greater trochanteric pain syndrome of both lower extremities    Plan: [place] markedly improved from a greater trochanteric pain syndrome aspect from previous injection and needs rehab.  His hip abductor strength is now equivocal and very strong.  Unfortunately he has had exacerbation of the anterior right hip.  His previous  x-rays do not show significant arthritis about the hip.  His provocative examination today is more indicative of tendinitis with specifially hip flexor tightness/tendinitis.  Strength is certainly not the issue, I think there may be a component of muscular imbalance with lower cross syndrome.  I did provide him some yoga exercises and rehab program at home for him to perform, however I would also like him to have evaluation from formalized physical therapy to address this imbalance and his hip flexor tightness.  We will also start him on Celebrex 200 mg daily for the first 2 weeks, then only as needed and attempt this calm down the irritation and inflammation.  We will see him back in 1 month if not improving.  May consider ultrasound of the anterior hip versus MRI/MRI of the right hip if not improving with conservative measures.  Follow-up: in 1 month if not improving   Meds & Orders:  Meds ordered this encounter  Medications   celecoxib (CELEBREX) 200 MG capsule    Sig: Take 1 capsule (200 mg total) by mouth daily.    Dispense:  90 capsule    Refill:  0    Orders Placed This Encounter  Procedures   Ambulatory referral to Physical Therapy     Procedures: No procedures performed      Clinical History: No specialty comments available.  He reports that he has never smoked. He has quit using smokeless tobacco.  Recent Labs  10/19/21 1120  HGBA1C 5.3    Objective:    Physical Exam  Gen: Well-appearing, in no acute distress; non-toxic CV: Regular Rate. Well-perfused. Warm.  Resp: Breathing unlabored on room air; no wheezing. Psych: Fluid speech in conversation; appropriate affect; normal thought process Neuro: Sensation intact throughout. No gross coordination deficits.   Ortho Exam  - Bilateral hips: There is no bony TTP.  There is some mild TTP with deep palpation of the proximal hip flexor, more so the psoas tendon musculature.  No erythema, ecchymosis or swelling are noted.   Passive logroll is equivalent without restriction of internal and external rotation.  Strength is 5/5 in all directions and markedly improved and lateral abduction strength bilaterally.  There is a forward nutation about the pelvis.  There is some tightness of bilateral hamstrings with straight leg raise actively only to about 65 degrees.  No pain with heel slide.  Tenderness to palpation of the greater trochanter.  He has mild pain with FABER and FADIR testing but only over the anterior hip flexors, not into the groin.  + Green test [employer] test  Imaging:  Hip x-ray b/l 03/26/22: 3 views of bilateral hips including AP pelvis, frog-leg and lateral views  were ordered and reviewed by myself.  X-rays show no acute fracture noted.   There is small spurring off the right acetabular rim.  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   Past Medical/Family/Surgical/Social History: Medications & Allergies reviewed per EMR, new medications updated. 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   TIA (transient ischemic attack) 10/30/2021   HLD (hyperlipidemia) 10/30/2021   Cerebrovascular accident (CVA) ([place]) 10/19/2021   BPH without obstruction/lower urinary tract symptoms 10/19/2021   Hypogonadism in male 10/19/2021   Alcohol abuse 10/19/2021   C1 cervical fracture ([place]) 10/19/2021   Past Medical History:  Diagnosis Date   BPH without obstruction/lower urinary tract symptoms    Closed head injury with concussion    x 2   CVA (cerebral vascular accident) ([place]) 10/19/2021   DIzzy, off balance   Headache    Hypogonadism in male    Medical history non-contributory    Family History  Problem Relation Age of Onset   Ovarian cancer Mother    Hypertension Father    Stroke Paternal Aunt    Past Surgical History:  Procedure Laterality Date   ELBOW SURGERY Right 06/2021   IR ANGIO EXTRACRAN SEL COM CAROTID  INNOMINATE UNI R MOD SED  05/04/2022   IR ANGIO INTRA EXTRACRAN SEL INTERNAL CAROTID UNI L MOD SED  05/04/2022   IR ANGIO VERTEBRAL SEL SUBCLAVIAN INNOMINATE UNI R MOD SED  05/04/2022   IR ANGIO VERTEBRAL SEL VERTEBRAL BILAT MOD SED  11/01/2021   IR ANGIO VERTEBRAL SEL VERTEBRAL UNI L MOD SED  05/04/2022   IR ANGIO VERTEBRAL SEL VERTEBRAL UNI R MOD SED  11/04/2021   IR ANGIOGRAM EXTREMITY RIGHT  11/04/2021   IR CT HEAD LTD  10/19/2021   IR INTRA CRAN STENT  11/04/2021   IR PERCUTANEOUS ART THROMBECTOMY/INFUSION INTRACRANIAL INC DIAG ANGIO  10/19/2021   IR US GUIDE VASC ACCESS RIGHT  10/19/2021   IR US GUIDE VASC ACCESS RIGHT  11/04/2021   IR US GUIDE VASC ACCESS RIGHT  05/04/2022   RADIOLOGY WITH ANESTHESIA N/A 10/19/2021   Procedure: IR WITH ANESTHESIA;  Surgeon: Radiologist, Medication, MD;  Location: [facility];  Service: Radiology;  Laterality: N/A;  RADIOLOGY WITH ANESTHESIA N/A 11/04/2021   Procedure: ANGIOPLASTY/STENTING;  Surgeon: Luanne Bras, MD;  Location: [place];  Service: Radiology;  Laterality: N/A;   Social History   Occupational History   Occupation: Teacher, early years/pre    Comment: [employer]  Tobacco Use   Smoking status: Never   Smokeless tobacco: Former  Scientific laboratory technician Use: Never used  Substance and Sexual Activity   Alcohol use: Not Currently   Drug use: Not Currently    Types: Marijuana    Comment: ocassional maijuana gummie- last time- early April 2023   Sexual activity: Not on file

## 2022-06-18 ENCOUNTER — Ambulatory Visit: Payer: BC Managed Care – PPO | Admitting: Rehabilitative and Restorative Service Providers"

## 2022-06-25 ENCOUNTER — Ambulatory Visit (INDEPENDENT_AMBULATORY_CARE_PROVIDER_SITE_OTHER): Payer: BC Managed Care – PPO | Admitting: Rehabilitative and Restorative Service Providers"

## 2022-06-25 ENCOUNTER — Encounter: Payer: Self-pay | Admitting: Rehabilitative and Restorative Service Providers"

## 2022-06-25 DIAGNOSIS — R2689 Other abnormalities of gait and mobility: Secondary | ICD-10-CM | POA: Diagnosis not present

## 2022-06-25 DIAGNOSIS — M25652 Stiffness of left hip, not elsewhere classified: Secondary | ICD-10-CM

## 2022-06-25 DIAGNOSIS — R262 Difficulty in walking, not elsewhere classified: Secondary | ICD-10-CM

## 2022-06-25 DIAGNOSIS — M25552 Pain in left hip: Secondary | ICD-10-CM

## 2022-06-25 NOTE — Therapy (Signed)
OUTPATIENT PHYSICAL THERAPY LOWER EXTREMITY EVALUATION   Patient Name: Christopher Pugh MRN: [id number] DOB:[birth year], 51 y.o., male Today's Date: 06/25/2022  END OF SESSION:  PT End of Session - 06/25/22 1526     Visit Number 1    Number of Visits 8    Authorization Type BCBS    PT Start Time 0930    PT Stop Time 1015    PT Time Calculation (min) 45 min    Activity Tolerance Patient tolerated treatment well    Behavior During Therapy WFL for tasks assessed/performed             Past Medical History:  Diagnosis Date   BPH without obstruction/lower urinary tract symptoms    Closed head injury with concussion    x 2   CVA (cerebral vascular accident) ([place]) 10/19/2021   DIzzy, off balance   Headache    Hypogonadism in male    Medical history non-contributory    Past Surgical History:  Procedure Laterality Date   ELBOW SURGERY Right 06/2021   IR ANGIO EXTRACRAN SEL COM CAROTID INNOMINATE UNI R MOD SED  05/04/2022   IR ANGIO INTRA EXTRACRAN SEL INTERNAL CAROTID UNI L MOD SED  05/04/2022   IR ANGIO VERTEBRAL SEL SUBCLAVIAN INNOMINATE UNI R MOD SED  05/04/2022   IR ANGIO VERTEBRAL SEL VERTEBRAL BILAT MOD SED  11/01/2021   IR ANGIO VERTEBRAL SEL VERTEBRAL UNI L MOD SED  05/04/2022   IR ANGIO VERTEBRAL SEL VERTEBRAL UNI R MOD SED  11/04/2021   IR ANGIOGRAM EXTREMITY RIGHT  11/04/2021   IR CT HEAD LTD  10/19/2021   IR INTRA CRAN STENT  11/04/2021   IR PERCUTANEOUS ART THROMBECTOMY/INFUSION INTRACRANIAL INC DIAG ANGIO  10/19/2021   IR US GUIDE VASC ACCESS RIGHT  10/19/2021   IR US GUIDE VASC ACCESS RIGHT  11/04/2021   IR US GUIDE VASC ACCESS RIGHT  05/04/2022   RADIOLOGY WITH ANESTHESIA N/A 10/19/2021   Procedure: IR WITH ANESTHESIA;  Surgeon: Radiologist, Medication, MD;  Location: [facility];  Service: Radiology;  Laterality: N/A;   RADIOLOGY WITH ANESTHESIA N/A 11/04/2021   Procedure: ANGIOPLASTY/STENTING;  Surgeon: Luanne Bras, MD;  Location: [facility];  Service: Radiology;   Laterality: N/A;   Patient Active Problem List   Diagnosis Date Noted   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   TIA (transient ischemic attack) 10/30/2021   HLD (hyperlipidemia) 10/30/2021   Cerebrovascular accident (CVA) ([place]) 10/19/2021   BPH without obstruction/lower urinary tract symptoms 10/19/2021   Hypogonadism in male 10/19/2021   Alcohol abuse 10/19/2021   C1 cervical fracture ([place]) 10/19/2021    PCP: Chesley Noon, MD  REFERRING PROVIDER: Elba Barman, DO  REFERRING DIAG:  Diagnosis  M25.552 (ICD-10-CM) - Pain in left hip    THERAPY DIAG:  Difficulty in walking, not elsewhere classified - Plan: PT plan of care cert/re-cert  Other abnormalities of gait and mobility - Plan: PT plan of care cert/re-cert  Stiffness of left hip, not elsewhere classified - Plan: PT plan of care cert/re-cert  Pain in left hip - Plan: PT plan of care cert/re-cert  Rationale for Evaluation and Treatment: Rehabilitation  ONSET DATE: September 2023  SUBJECTIVE:   SUBJECTIVE STATEMENT: Christopher Pugh had just started a job requiring a lot of standing in September 2023 when he started having increased left hip pain.  He does not describe the pain as severe, but he feels stiffer and more limited than he expects to be.  He is still doing  his normal workouts, but is very sore/achy with his R > L  hip and low back.  PERTINENT HISTORY: Previous concussions, CVA, previous R elbow surgery, HLD, previous C1 fracture  PAIN:  Are you having pain? Yes: NPRS scale: 1-3/10 Pain location: Left hip Pain description: Achy/sore Aggravating factors: Prolonged postures like standing, sitting and sleeping Relieving factors: Stretching and working out  PRECAUTIONS: None  WEIGHT BEARING RESTRICTIONS: No  FALLS:  Has patient fallen in last 6 months? No  LIVING ENVIRONMENT: Lives with: lives with their spouse Lives in: House/apartment Stairs:  Some soreness but no pain Has following equipment at  home: None  OCCUPATION: Pensions consultant, was Journalist, newspaper at [employer] would like to be able to fully participate in his normal activities at work in the gym without having to spend excessive time with stretching to avoid the achy stiff feeling he has in his left > right hips and low back.  NEXT MD VISIT:   OBJECTIVE:   DIAGNOSTIC FINDINGS: 3 views of bilateral hips including AP pelvis, frog-leg and lateral views  were ordered and reviewed by myself.  X-rays show no acute fracture noted.   There is small spurring off the right acetabular rim.  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  PATIENT SURVEYS:  FOTO 51 (Goal 64 in 13 visits)  COGNITION: Overall cognitive status: Within functional limits for tasks assessed     SENSATION: Christopher Pugh did not mention any peripheral pain or paresthesias  MUSCLE LENGTH: Hamstrings: Right 50 deg; Left 45 deg Christopher Pugh test: No hip flexors tightness noted   LOWER EXTREMITY ROM:  Passive ROM 06/25/2022 Left eval  Hip flexion 105/105   Hip extension    Hip abduction    Hip adduction    Hip internal rotation 7/3   Hip external rotation 17/24   Lumbar extension 10   Knee extension    Ankle dorsiflexion    Ankle plantarflexion    Ankle inversion    Ankle eversion     (Blank rows = not tested)  LOWER EXTREMITY MMT:  MMT 06/25/2022 Lt/Rt Left eval  Hip flexion 5/5 Lt/5-/5 Rt   Hip extension    Hip abduction 5/5 LT/5-/5 Rt   Hip adduction    Hip internal rotation    Hip external rotation    Knee flexion    Knee extension 5/5 Bil   Ankle dorsiflexion    Ankle plantarflexion    Ankle inversion    Ankle eversion     (Blank rows = not tested)  LOWER EXTREMITY SPECIAL TESTS:  Hip special tests: Negative for IT Band tightness  GAIT: Distance walked: In the clinic Assistive device utilized: None Level of assistance: Complete Independence Comments: No  abnormalities are noted, although Christopher Pugh notes stiffness with prolonged sitting, standing and walking   TODAY'S TREATMENT:  DATE: 06/25/2022  Gluteal stretch (knee to opposite shoulder with the other leg straight) 4 x 20 seconds bilaterally Figure 4 stretch 4 x 20 seconds bilaterally Supine IT band stretch 2 x 20 seconds bilaterally Hip hike, IR and push into doorframe 5 x 3 seconds bilaterally  PATIENT EDUCATION:  Education details: See above Person educated: Patient and Spouse Education method: Explanation, Demonstration, Tactile cues, Verbal cues, and Handouts Education comprehension: verbalized understanding, returned demonstration, verbal cues required, tactile cues required, and needs further education  HOME EXERCISE PROGRAM: Access Code: [email or web address] URL: [email or web address] Date: 06/25/2022 Prepared by: Vista Mink  Exercises - Supine Gluteus Stretch  - 2 x daily - 7 x weekly - 1 sets - 5 reps - 20 seconds hold - Supine Figure 4 Piriformis Stretch  - 2 x daily - 7 x weekly - 1 sets - 5 reps - 20 seconds hold - Supine ITB Stretch with Strap  - 2 x daily - 7 x weekly - 1 sets - 5 reps - 20 seconds hold - Standing Hip Hiking, IR and push into door frame - 1 x daily - 7 x weekly - 2 sets - 5 reps - 3 seconds hold  ASSESSMENT:  CLINICAL IMPRESSION: Patient is a 51 y.o. male who was seen today for physical therapy evaluation and treatment for  Diagnosis  M25.552 (ICD-10-CM) - Pain in left hip  .  Christopher Pugh started having problems in September after starting a new job that required being on his feet all day.  He does not have severe pain but is much more achy and stiff than he would like to be and he feels like he is spending a ridiculous amount of time stretching in order to feel comfortable to complete his normal activities.  The most significant  objective finding was very limited hip rotation and some mild hip flexors and hip abductors weakness.  All were addressed with his home exercise program which was prescribed today.  This program will need to be appropriately corrected and progressed in order to meet long-term goals.  OBJECTIVE IMPAIRMENTS: Abnormal gait, decreased endurance, decreased knowledge of condition, difficulty walking, decreased ROM, decreased strength, hypomobility, impaired perceived functional ability, impaired flexibility, and pain.   ACTIVITY LIMITATIONS: bending, sitting, standing, squatting, bed mobility, and locomotion level  PARTICIPATION LIMITATIONS: community activity, occupation, and gym  PERSONAL FACTORS: Previous concussions, CVA, previous R elbow surgery, HLD, previous C1 fracture are also affecting patient's functional outcome.   REHAB POTENTIAL: Good  CLINICAL DECISION MAKING: Stable/uncomplicated  EVALUATION COMPLEXITY: Low   GOALS: Goals reviewed with patient? Yes  SHORT TERM GOALS: Target date: 07/23/2022 Thurmond Butts will improve bilateral hip IR AROM to 10 degrees and bilateral hip ER AROM to 35 degrees Baseline: 7/3 and 17/24 respectively Goal status: INITIAL  2.  Sami will report less difficulty with prolonged standing, sitting and with activities first thing in the morning Baseline: The reason he is attending physical therapy Goal status: INITIAL  3.  Ladarien will be independent with his day 1 home exercise program Baseline: Started 06/25/2022 Goal status: INITIAL  LONG TERM GOALS: Target date: 08/20/2022  Improve FOTO to 64 Baseline: 51 Goal status: INITIAL  2.  Trace will report left hip pain consistently less than 2 out of 10 on the visual analog scale Baseline: Can be 3 out of 10 and occasionally higher Goal status: INITIAL  3.  Improve bilateral hip ER active range of motion to 40 degrees Baseline: 17/24 degrees Goal status: INITIAL  4.  Improve bilateral hip strength for hip  abductors and hip flexors as assessed by MMT and functional scores Baseline: 5-/5 Goal status: INITIAL  5.  Thurmond Butts will be able to resume all normal activities without spending more than 10 minutes of stretching daily Baseline: Spending significantly more time stretching to participate in normal ADLs Goal status: INITIAL  6.  Thurmond Butts will be independent with his long-term home exercise program at discharge Baseline: Started 06/25/2022 Goal status: INITIAL   PLAN:  PT FREQUENCY: 1x/week  PT DURATION: 8 weeks  PLANNED INTERVENTIONS: Therapeutic exercises, Therapeutic activity, Neuromuscular re-education, Balance training, Gait training, Patient/Family education, Self Care, Joint mobilization, Stair training, Dry Needling, Cryotherapy, and Manual therapy  PLAN FOR NEXT SESSION: Review home exercise program.  Consider possible progressions for hip flexors and hip abductor strength.  Heavy emphasis on IR and ER stretching of the hip.   Farley Ly, PT, MPT 06/25/2022, 3:45 PM

## 2022-07-01 NOTE — Therapy (Addendum)
OUTPATIENT PHYSICAL THERAPY TREATMENT NOTE /DISCHARGE   Patient Name: Christopher Pugh MRN: [id number] DOB:1970/12/26, 51 y.o., male Today's Date: 07/02/2022  PCP: Christopher Noon, Pugh   REFERRING PROVIDER: Elba Barman, DO  END OF SESSION:   PT End of Session - 07/02/22 0846     Visit Number 2    Number of Visits 8    Authorization Type BCBS    PT Start Time 0848    PT Stop Time 0928    PT Time Calculation (min) 40 min    Activity Tolerance Patient tolerated treatment well;No increased pain    Behavior During Therapy WFL for tasks assessed/performed             Past Medical History:  Diagnosis Date   BPH without obstruction/lower urinary tract symptoms    Closed head injury with concussion    x 2   CVA (cerebral vascular accident) (Christopher Pugh) 10/19/2021   DIzzy, off balance   Headache    Hypogonadism in male    Medical history non-contributory    Past Surgical History:  Procedure Laterality Date   ELBOW SURGERY Right 06/2021   IR ANGIO EXTRACRAN SEL COM CAROTID INNOMINATE UNI R MOD SED  05/04/2022   IR ANGIO INTRA EXTRACRAN SEL INTERNAL CAROTID UNI L MOD SED  05/04/2022   IR ANGIO VERTEBRAL SEL SUBCLAVIAN INNOMINATE UNI R MOD SED  05/04/2022   IR ANGIO VERTEBRAL SEL VERTEBRAL BILAT MOD SED  11/01/2021   IR ANGIO VERTEBRAL SEL VERTEBRAL UNI L MOD SED  05/04/2022   IR ANGIO VERTEBRAL SEL VERTEBRAL UNI R MOD SED  11/04/2021   IR ANGIOGRAM EXTREMITY RIGHT  11/04/2021   IR CT HEAD LTD  10/19/2021   IR INTRA CRAN STENT  11/04/2021   IR PERCUTANEOUS ART THROMBECTOMY/INFUSION INTRACRANIAL INC DIAG ANGIO  10/19/2021   IR US GUIDE VASC ACCESS RIGHT  10/19/2021   IR US GUIDE VASC ACCESS RIGHT  11/04/2021   IR US GUIDE VASC ACCESS RIGHT  05/04/2022   RADIOLOGY WITH ANESTHESIA N/A 10/19/2021   Procedure: IR WITH ANESTHESIA;  Surgeon: Christopher Pugh, Medication, Pugh;  Location: [facility];  Service: Radiology;  Laterality: N/A;   RADIOLOGY WITH ANESTHESIA N/A 11/04/2021   Procedure:  ANGIOPLASTY/STENTING;  Surgeon: Christopher Pugh;  Location: [facility];  Service: Radiology;  Laterality: N/A;   Patient Active Problem List   Diagnosis Date Noted   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   TIA (transient ischemic attack) 10/30/2021   HLD (hyperlipidemia) 10/30/2021   Cerebrovascular accident (CVA) ([place]) 10/19/2021   BPH without obstruction/lower urinary tract symptoms 10/19/2021   Hypogonadism in male 10/19/2021   Alcohol abuse 10/19/2021   C1 cervical fracture ([place]) 10/19/2021    REFERRING DIAG: M25.552 (ICD-10-CM) - Pain in left hip   THERAPY DIAG:  Difficulty in walking, not elsewhere classified  Other abnormalities of gait and mobility  Stiffness of left hip, not elsewhere classified  Pain in left hip  Rationale for Evaluation and Treatment Rehabilitation  PERTINENT HISTORY: Previous concussions, CVA, previous R elbow surgery, HLD, previous C1 fracture   PRECAUTIONS: none  PATIENT GOALS: Christopher Pugh would like to be able to fully participate in his normal activities at work in the gym without having to spend excessive time with stretching to avoid the achy stiff feeling he has in his left > right hips and low back.  SUBJECTIVE:  SUBJECTIVE STATEMENT:   07/02/2022 ran this morning ~3 miles, still having a lot of morning stiffness. No pain at present, more so stiffness.  Reports good HEP compliance   Eval - Christopher Pugh had just started a job requiring a lot of standing in September 2023 when he started having increased left hip pain.  He does not describe the pain as severe, but he feels stiffer and more limited than he expects to be.  He is still doing his normal workouts, but is very sore/achy with his R > L  hip and low back.  PAIN:  07/02/2022  Eval - Are you having pain? no  overt pain at present, more so soreness/stiffness: NPRS scale: 1-3/10 Pain location: Left hip Pain description: Achy/sore Aggravating factors: Prolonged postures like standing, sitting and sleeping Relieving factors: Stretching and working out   OBJECTIVE: (objective measures completed at initial evaluation unless otherwise dated)   DIAGNOSTIC FINDINGS: 3 views of bilateral hips including AP pelvis, frog-leg and lateral views  were ordered and reviewed by myself.  X-rays show no acute fracture noted.   There is small spurring off the right acetabular rim.  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   PATIENT SURVEYS:  FOTO 51 (Goal 64 in 13 visits)   COGNITION: Overall cognitive status: Within functional limits for tasks assessed                         SENSATION: Christopher Pugh did not mention any peripheral pain or paresthesias   MUSCLE LENGTH: Hamstrings: Right 50 deg; Left 45 deg [name] test: No hip flexors tightness noted     LOWER EXTREMITY ROM:   Passive ROM 06/25/2022 Left eval  Hip flexion 105/105    Hip extension      Hip abduction      Hip adduction      Hip internal rotation 7/3    Hip external rotation 17/24    Lumbar extension 10    Knee extension      Ankle dorsiflexion      Ankle plantarflexion      Ankle inversion      Ankle eversion       (Blank rows = not tested)   LOWER EXTREMITY MMT:   MMT 06/25/2022 Lt/Rt Left eval  Hip flexion 5/5 Lt/5-/5 Rt    Hip extension      Hip abduction 5/5 LT/5-/5 Rt    Hip adduction      Hip internal rotation      Hip external rotation      Knee flexion      Knee extension 5/5 Bil    Ankle dorsiflexion      Ankle plantarflexion      Ankle inversion      Ankle eversion       (Blank rows = not tested)   LOWER EXTREMITY SPECIAL TESTS:  Hip special tests: Negative for IT Band tightness   GAIT: Distance walked: In the clinic Assistive device utilized: None Level of assistance: Complete  Independence Comments: No abnormalities are noted, although Christopher Pugh notes stiffness with prolonged sitting, standing and walking     TODAY'S TREATMENT: Christopher Pugh Adult PT Treatment:                                                DATE: 07/02/22 Therapeutic  Exercise: Standing hip flexion w/ GTB 2x15 each LE, cues for upright posture Seated cable column hip ER 5# x10 10# x10, BLE, cues for form and full ROM Supine ER stretch 3x30 sec each LE cues for breath control Supine Seidel stretch with strap 2x30sec each LE  Manual Therapy: Supine: LAD grade 2-3 R hip joint. passive physiological movement  into hip flexion and external rotation Sidelying: active pin and stretch with trigger point release glutes and piriformis (active abduction); TFL active pin and stretch with knee supported by PT                                                                                                                            DATE: 06/25/2022  Gluteal stretch (knee to opposite shoulder with the other leg straight) 4 x 20 seconds bilaterally Figure 4 stretch 4 x 20 seconds bilaterally Supine IT band stretch 2 x 20 seconds bilaterally Hip hike, IR and push into doorframe 5 x 3 seconds bilaterally   PATIENT EDUCATION:  Education details: See above Person educated: Patient and Spouse Education method: Explanation, Demonstration, Tactile cues, Verbal cues, and Handouts Education comprehension: verbalized understanding, returned demonstration, verbal cues required, tactile cues required, and needs further education   HOME EXERCISE PROGRAM: Access Code: [email or web address] URL: [email or web address] Date: 06/25/2022 Prepared by: Vista Mink   Exercises - Supine Gluteus Stretch  - 2 x daily - 7 x weekly - 1 sets - 5 reps - 20 seconds hold - Supine Figure 4 Piriformis Stretch  - 2 x daily - 7 x weekly - 1 sets - 5 reps - 20 seconds hold - Supine ITB Stretch with Strap  - 2 x daily - 7 x weekly - 1 sets - 5 reps -  20 seconds hold - Standing Hip Hiking, IR and push into door frame - 1 x daily - 7 x weekly - 2 sets - 5 reps - 3 seconds hold  07/02/22: Verbal addition of standing hip flexion marches with band 2x15, seated TB ER 2x10 as performed in clinic today, pt politely declines handout/formal HEP update, verbalizes understanding of appropriate performance   ASSESSMENT:   CLINICAL IMPRESSION: 07/02/22 Pt arrives w/o significant pain, primary report of stiffness and anterior hip discomfort during end of stance phase. Good response to manual during intervention but continues with similar symptoms afterwards. Addition of hip flexor endurance training and seated ER strengthening with emphasis on full range, pt tolerates well with report of muscular fatigue. Ended session with stretches emphasizing hip mobility, most provocative activity today is supine [place] stretch. Pt tolerates session well without increase in resting pain, no adverse events. Pt departs today's session in no acute distress, all voiced questions/concerns addressed appropriately from PT perspective.     Eval - Patient is a 51 y.o. male who was seen today for physical therapy evaluation and treatment for  Diagnosis  M25.552 (ICD-10-CM) - Pain in left hip  .  Donta started having problems  in September after starting a new job that required being on his feet all day.  He does not have severe pain but is much more achy and stiff than he would like to be and he feels like he is spending a ridiculous amount of time stretching in order to feel comfortable to complete his normal activities.  The most significant objective finding was very limited hip rotation and some mild hip flexors and hip abductors weakness.  All were addressed with his home exercise program which was prescribed today.  This program will need to be appropriately corrected and progressed in order to meet long-term goals.   OBJECTIVE IMPAIRMENTS: Abnormal gait, decreased endurance,  decreased knowledge of condition, difficulty walking, decreased ROM, decreased strength, hypomobility, impaired perceived functional ability, impaired flexibility, and pain.    ACTIVITY LIMITATIONS: bending, sitting, standing, squatting, bed mobility, and locomotion level   PARTICIPATION LIMITATIONS: community activity, occupation, and gym   PERSONAL FACTORS: Previous concussions, CVA, previous R elbow surgery, HLD, previous C1 fracture are also affecting patient's functional outcome.    REHAB POTENTIAL: Good   CLINICAL DECISION MAKING: Stable/uncomplicated   EVALUATION COMPLEXITY: Low     GOALS: Goals reviewed with patient? Yes   SHORT TERM GOALS: Target date: 07/23/2022 Thurmond Butts will improve bilateral hip IR AROM to 10 degrees and bilateral hip ER AROM to 35 degrees Baseline: 7/3 and 17/24 respectively Goal status: INITIAL   2.  Primitivo will report less difficulty with prolonged standing, sitting and with activities first thing in the morning Baseline: The reason he is attending physical therapy Goal status: INITIAL   3.  Aadon will be independent with his day 1 home exercise program Baseline: Started 06/25/2022 Goal status: INITIAL   LONG TERM GOALS: Target date: 08/20/2022   Improve FOTO to 64 Baseline: 51 Goal status: INITIAL   2.  [place] will report left hip pain consistently less than 2 out of 10 on the visual analog scale Baseline: Can be 3 out of 10 and occasionally higher Goal status: INITIAL   3.  Improve bilateral hip ER active range of motion to 40 degrees Baseline: 17/24 degrees Goal status: INITIAL   4.  Improve bilateral hip strength for hip abductors and hip flexors as assessed by MMT and functional scores Baseline: 5-/5 Goal status: INITIAL   5.  Thurmond Butts will be able to resume all normal activities without spending more than 10 minutes of stretching daily Baseline: Spending significantly more time stretching to participate in normal ADLs Goal status: INITIAL    6.  Thurmond Butts will be independent with his long-term home exercise program at discharge Baseline: Started 06/25/2022 Goal status: INITIAL     PLAN:   PT FREQUENCY: 1x/week   PT DURATION: 8 weeks   PLANNED INTERVENTIONS: Therapeutic exercises, Therapeutic activity, Neuromuscular re-education, Balance training, Gait training, Patient/Family education, Self Care, Joint mobilization, Stair training, Dry Needling, Cryotherapy, and Manual therapy   PLAN FOR NEXT SESSION: Review/update HEP. Consider possible progressions for hip flexors and hip abductor strength.  Heavy emphasis on IR and ER stretching of the hip.   Leeroy Cha PT, DPT 07/02/2022 9:32 AM     PHYSICAL THERAPY DISCHARGE SUMMARY  Visits from Start of Care: 2  Current functional level related to goals / functional outcomes: See note   Remaining deficits: See note   Education / Equipment: HEP  Patient goals were partially met. Patient is being discharged due to not returning since the last visit.  Scot Jun, PT, DPT, OCS,  ATC 08/12/22  1:36 PM

## 2022-07-02 ENCOUNTER — Encounter: Payer: Self-pay | Admitting: Physical Therapy

## 2022-07-02 ENCOUNTER — Ambulatory Visit (INDEPENDENT_AMBULATORY_CARE_PROVIDER_SITE_OTHER): Payer: BC Managed Care – PPO | Admitting: Physical Therapy

## 2022-07-02 DIAGNOSIS — R262 Difficulty in walking, not elsewhere classified: Secondary | ICD-10-CM

## 2022-07-02 DIAGNOSIS — R2689 Other abnormalities of gait and mobility: Secondary | ICD-10-CM | POA: Diagnosis not present

## 2022-07-02 DIAGNOSIS — M25552 Pain in left hip: Secondary | ICD-10-CM

## 2022-07-02 DIAGNOSIS — M25652 Stiffness of left hip, not elsewhere classified: Secondary | ICD-10-CM | POA: Diagnosis not present

## 2022-07-14 NOTE — Addendum Note (Signed)
Addended by: Maricela Bo on: 07/14/2022 03:18 PM   Modules accepted: Orders

## 2022-07-27 ENCOUNTER — Encounter: Payer: BC Managed Care – PPO | Admitting: Physical Therapy

## 2022-09-09 ENCOUNTER — Encounter: Payer: Self-pay | Admitting: Radiology

## 2022-10-27 ENCOUNTER — Encounter: Payer: Self-pay | Admitting: Sports Medicine

## 2022-10-27 ENCOUNTER — Ambulatory Visit: Payer: BC Managed Care – PPO | Admitting: Sports Medicine

## 2022-10-27 DIAGNOSIS — M24551 Contracture, right hip: Secondary | ICD-10-CM | POA: Diagnosis not present

## 2022-10-27 DIAGNOSIS — M25552 Pain in left hip: Secondary | ICD-10-CM

## 2022-10-27 DIAGNOSIS — M25551 Pain in right hip: Secondary | ICD-10-CM

## 2022-10-27 DIAGNOSIS — M9905 Segmental and somatic dysfunction of pelvic region: Secondary | ICD-10-CM | POA: Diagnosis not present

## 2022-10-27 DIAGNOSIS — M24552 Contracture, left hip: Secondary | ICD-10-CM

## 2022-10-27 MED ORDER — METHYLPREDNISOLONE 4 MG PO TBPK
ORAL_TABLET | ORAL | 0 refills | Status: DC
Start: 2022-10-27 — End: 2023-08-21

## 2022-10-27 NOTE — Progress Notes (Signed)
Christopher Pugh - 52 y.o. male MRN [id number]  Date of birth: 09/02/1970  Office Visit Note: Visit Date: 10/27/2022 PCP: Christopher Inch, MD Referred by: Christopher Inch, MD  Subjective: Chief Complaint  Patient presents with   Right Hip - Pain   Left Hip - Pain   HPI: Christopher Pugh is a pleasant 52 y.o. male who presents today for bilateral hip pain and tightness.  Christopher Pugh is having pain and tightness over the anterior hips, this is bilateral but the right is worse than the left.  He is having quite a bit of stiffness and pain first thing when waking up, this will improve when he does his vigorous stretching routine.  As the day goes on he is doing well, but at nighttime he is also having pain.  In the past he has had greater trochanteric pain syndrome, this did resolve with an injection as well as working on hip strengthening.  He continues to be active daily doing his stretches and exercises, as well as very active in the gym.  He did have 2 separate physical therapy evaluations and was told that he has very tight hips and hip flexors.  He does tell me today he has a history of pulled hip flexors when he was younger bilaterally.  He denies any pain that radiates into the groin.  No numbness or tingling.  Pertinent ROS were reviewed with the patient and found to be negative unless otherwise specified above in HPI.   Assessment & Plan: Visit Diagnoses:  1. Bilateral hip pain   2. Hip flexor tendon tightness, left   3. Hip flexor tendon tightness, right   4. Segmental and somatic dysfunction of pelvic region    Plan: Discussed with Christopher Pugh the nature of his bilateral hip pain.  He does have tight hip flexors bilaterally and I feel some of his pain is emanating from the tensor fascia lata today.  There are some provocative maneuvers coming from the hip with FADIR and Stinchfield, although his x-rays do not suggest much arthritic change.  He does not have the clicking or catching that I  would expect in a labral pathology.  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  He is extremely active and I would like to keep him this way.  I do think he may benefit from some sports chiropractic care, I did refer him to Christopher Pugh today for a few sessions and evaluation.  Given the acuity of his pain, I would like to settle down his pain and inflammation with a 6-day prednisone taper.  I did give him some exercises to focus on strengthening and elongating of the hamstring musculature as well as lower back extensions to help with the lower crossed syndrome, handouts were provided today.  I would like to see how this does over the next month and he will keep me updated.  Future treatment considerations: -Ultrasound-guided intra-articular hip injection, ultrasound-guided hip flexor/TFL injection -MRI of pelvis or hips  Follow-up: Return if symptoms worsen or fail to improve.   Meds & Orders:  Meds ordered this encounter  Medications   methylPREDNISolone (MEDROL DOSEPAK) 4 MG TBPK tablet    Sig: Take per packet instructions. Taper dosing.    Dispense:  1 each    Refill:  0    Orders Placed This Encounter  Procedures   Ambulatory referral to Chiropractic     Procedures: No procedures performed  Clinical History: No specialty comments available.  He reports that he has never smoked. He has quit using smokeless tobacco. No results for input(s): "HGBA1C", "LABURIC" in the last 8760 hours.  Objective:   Vital Signs: There were no vitals taken for this visit.  Physical Exam  Gen: Well-appearing, in no acute distress; non-toxic CV:  Well-perfused. Warm.  Resp: Breathing unlabored on room air; no wheezing. Psych: Fluid speech in conversation; appropriate affect; normal thought process Neuro: Sensation intact throughout. No gross coordination deficits.   Ortho Exam - Bilateral hips: No overlying redness or swelling.  There is TTP just distal off the lateral aspect  of the ASIS overlying the tensor fascia lata region.  There is muscle hypertonicity here.  No greater trochanteric TTP.  There are no blocks to internal or external rotation.  There is some pain with FADIR and Stinchfield testing right greater than left.  There is restriction with hip flexion bilaterally.  5/5 strength about the hips and legs bilaterally in all directions.  Imaging: No results found.  Past Medical/Family/Surgical/Social History: Medications & Allergies reviewed per EMR, new medications updated. 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   TIA (transient ischemic attack) 10/30/2021   HLD (hyperlipidemia) 10/30/2021   Cerebrovascular accident (CVA) 10/19/2021   BPH without obstruction/lower urinary tract symptoms 10/19/2021   Hypogonadism in male 10/19/2021   Alcohol abuse 10/19/2021   C1 cervical fracture 10/19/2021   Past Medical History:  Diagnosis Date   BPH without obstruction/lower urinary tract symptoms    Closed head injury with concussion    x 2   CVA (cerebral vascular accident) 10/19/2021   DIzzy, off balance   Headache    Hypogonadism in male    Medical history non-contributory    Family History  Problem Relation Age of Onset   Ovarian cancer Mother    Hypertension Father    Stroke Paternal Aunt    Past Surgical History:  Procedure Laterality Date   ELBOW SURGERY Right 06/2021   IR ANGIO EXTRACRAN SEL COM CAROTID INNOMINATE UNI R MOD SED  05/04/2022   IR ANGIO INTRA EXTRACRAN SEL INTERNAL CAROTID UNI L MOD SED  05/04/2022   IR ANGIO VERTEBRAL SEL SUBCLAVIAN INNOMINATE UNI R MOD SED  05/04/2022   IR ANGIO VERTEBRAL SEL VERTEBRAL BILAT MOD SED  11/01/2021   IR ANGIO VERTEBRAL SEL VERTEBRAL UNI L MOD SED  05/04/2022   IR ANGIO VERTEBRAL SEL VERTEBRAL UNI R MOD SED  11/04/2021   IR ANGIOGRAM EXTREMITY RIGHT  11/04/2021   IR CT HEAD LTD  10/19/2021   IR INTRA CRAN STENT  11/04/2021   IR PERCUTANEOUS ART  THROMBECTOMY/INFUSION INTRACRANIAL INC DIAG ANGIO  10/19/2021   IR US GUIDE VASC ACCESS RIGHT  10/19/2021   IR US GUIDE VASC ACCESS RIGHT  11/04/2021   IR US GUIDE VASC ACCESS RIGHT  05/04/2022   RADIOLOGY WITH ANESTHESIA N/A 10/19/2021   Procedure: IR WITH ANESTHESIA;  Surgeon: Radiologist, Medication, MD;  Location: [facility] OR;  Service: Radiology;  Laterality: N/A;   RADIOLOGY WITH ANESTHESIA N/A 11/04/2021   Procedure: ANGIOPLASTY/STENTING;  Surgeon: Julieanne Cotton, MD;  Location: [facility] OR;  Service: Radiology;  Laterality: N/A;   Social History   Occupational History   Occupation: Designer, television/film set    Comment: [employer]  Tobacco Use   Smoking status: Never   Smokeless tobacco: Former  Building services engineer Use: Never used  Substance and Sexual  Activity   Alcohol use: Not Currently   Drug use: Not Currently    Types: Marijuana    Comment: ocassional maijuana gummie- last time- early April 2023   Sexual activity: Not on file

## 2023-08-21 ENCOUNTER — Other Ambulatory Visit: Payer: Self-pay | Admitting: Sports Medicine

## 2023-08-22 NOTE — Telephone Encounter (Signed)
Gil patient 

## 2023-08-25 MED ORDER — METHYLPREDNISOLONE 4 MG PO TBPK: ORAL_TABLET | ORAL | 0 refills | Status: DC

## 2023-09-22 ENCOUNTER — Encounter: Payer: Self-pay | Admitting: Sports Medicine

## 2023-09-22 ENCOUNTER — Ambulatory Visit: Payer: Self-pay | Admitting: Sports Medicine

## 2023-09-22 ENCOUNTER — Other Ambulatory Visit: Payer: Self-pay

## 2023-09-22 DIAGNOSIS — M7711 Lateral epicondylitis, right elbow: Secondary | ICD-10-CM | POA: Diagnosis not present

## 2023-09-22 DIAGNOSIS — M25521 Pain in right elbow: Secondary | ICD-10-CM

## 2023-09-22 MED ORDER — MELOXICAM 15 MG PO TABS: 15.0000 mg | ORAL_TABLET | Freq: Every day | ORAL | 0 refills | Status: AC

## 2023-09-22 NOTE — Progress Notes (Signed)
 Christopher Pugh - 53 y.o. male MRN [id number]  Date of birth: [birth year]  Office Visit Note: Visit Date: 09/22/2023 PCP: Eartha Inch, MD Referred by: Eartha Inch, MD  Subjective: Chief Complaint  Patient presents with   Right Elbow - Pain   HPI: Christopher Pugh is a very pleasant 53 y.o. male who presents today for chronic right elbow pain/LE.  He has had pain over the right lateral elbow for a few months now.  He denies any weakness or specific injury but feels irritated and inflamed.  He has been doing some exercises to help strengthen but this has only given him minimal relief.  He has pain and some popping with pronation and supination.  He has decreased his load at the gym and modified activity so his pain persists.  He is using ibuprofen as needed.  Pertinent ROS were reviewed with the patient and found to be negative unless otherwise specified above in HPI.   Assessment & Plan: Visit Diagnoses:  1. Lateral epicondylitis, right elbow   2. Pain in right elbow    Plan: Impression is chronic right lateral elbow pain with exam and ultrasound findings of lateral epicondylitis.  There may be some small interstitial tearing but certainly no high-grade tearing nor tendon retraction was seen on ultrasound.  There is inflammation which is driving his pain.  Through shared decision making, we did proceed with ultrasound-guided lateral epicondyle and insertional CET (tendon) injection, patient tolerated well.  Advised on modified rest and activity for the remainder of this week.  He may use ice for any postinjection pain.  I would like to start him on a short course of meloxicam 15 mg to be taken for 2 weeks scheduled.  Starting this upcoming week he may start a home exercise program, handout provided for him today.  He will keep me updated with how he is doing, otherwise follow-up as needed.  Follow-up: Return if symptoms worsen or fail to improve.   Meds & Orders:  Meds ordered  this encounter  Medications   meloxicam (MOBIC) 15 MG tablet    Sig: Take 1 tablet (15 mg total) by mouth daily.    Dispense:  21 tablet    Refill:  0    Orders Placed This Encounter  Procedures   [place] Extrem Up Right Ltd     Procedures: Procedure: Lateral Epicondylitis (Common Extensor Tendon) Injection, Right Elbow After informed verbal consent was obtained, a timeout was performed.  Patient was lying supine on exam table.  Area overlying the affected lateral epicondyle was prepped with chloraprep and alcohol swabs. Then utilizing ultrasound guidance via an in-plane approach, the patient's lateral epicondyle and common extensor tendon was injected with 1:1:1 lidocaine:bupivicaine:betamethasone with multiple needle fenestrations from a distal to proximal direction. Patient tolerated procedure well without immediate complications.      Clinical History: No specialty comments available.  He reports that he has never smoked. He has quit using smokeless tobacco. No results for input(s): "HGBA1C", "LABURIC" in the last 8760 hours.  Objective:    Physical Exam  Gen: Well-appearing, in no acute distress; non-toxic CV: Well-perfused. Warm.  Resp: Breathing unlabored on room air; no wheezing. Psych: Fluid speech in conversation; appropriate affect; normal thought process  Ortho Exam - Right elbow: + TTP over the lateral epicondyle and the origin of the common extensor tendon.  There is mild soft tissue swelling in this location.  There is full range of motion about the elbow  joint with flexion and extension.  Positive Maudsley's test, pain with resisted supination and resisted wrist extension.  There is no gross weakness with this testing.  Imaging: [place] Extrem Up Right Ltd Result Date: 09/22/2023 Limited musculoskeletal ultrasound of the right lateral elbow was performed today.  The lateral epicondyle was seen with a small spur over the superior aspect of the bone.  The overlying common  extensor and tendon was seen with proper insertion on the lateral epicondyle.  There is a small calcification proximal origin of the CET.  There is mild hypoechoic change in this location indicative of tendinopathy with probable small interstitial tearing, but no high-grade tearing nor tendon retraction.  There is mild to moderate hyperemia in this location. *Procedurally successful ultrasound-guided right lateral epicondyle and CET injection.   Past Medical/Family/Surgical/Social History: Medications & Allergies reviewed per EMR, new medications updated. 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   TIA (transient ischemic attack) 10/30/2021   HLD (hyperlipidemia) 10/30/2021   Cerebrovascular accident (CVA) (HCC) 10/19/2021   BPH without obstruction/lower urinary tract symptoms 10/19/2021   Hypogonadism in male 10/19/2021   Alcohol abuse 10/19/2021   C1 cervical fracture (HCC) 10/19/2021   Past Medical History:  Diagnosis Date   BPH without obstruction/lower urinary tract symptoms    Closed head injury with concussion    x 2   CVA (cerebral vascular accident) (HCC) 10/19/2021   DIzzy, off balance   Headache    Hypogonadism in male    Medical history non-contributory    Family History  Problem Relation Age of Onset   Ovarian cancer Mother    Hypertension Father    Stroke Paternal Aunt    Past Surgical History:  Procedure Laterality Date   ELBOW SURGERY Right 06/2021   IR ANGIO EXTRACRAN SEL COM CAROTID INNOMINATE UNI R MOD SED  05/04/2022   IR ANGIO INTRA EXTRACRAN SEL INTERNAL CAROTID UNI L MOD SED  05/04/2022   IR ANGIO VERTEBRAL SEL SUBCLAVIAN INNOMINATE UNI R MOD SED  05/04/2022   IR ANGIO VERTEBRAL SEL VERTEBRAL BILAT MOD SED  11/01/2021   IR ANGIO VERTEBRAL SEL VERTEBRAL UNI L MOD SED  05/04/2022   IR ANGIO VERTEBRAL SEL VERTEBRAL UNI R MOD SED  11/04/2021   IR ANGIOGRAM EXTREMITY RIGHT  11/04/2021   IR CT HEAD LTD   10/19/2021   IR INTRA CRAN STENT  11/04/2021   IR PERCUTANEOUS ART THROMBECTOMY/INFUSION INTRACRANIAL INC DIAG ANGIO  10/19/2021   IR US GUIDE VASC ACCESS RIGHT  10/19/2021   IR US GUIDE VASC ACCESS RIGHT  11/04/2021   IR US GUIDE VASC ACCESS RIGHT  05/04/2022   RADIOLOGY WITH ANESTHESIA N/A 10/19/2021   Procedure: IR WITH ANESTHESIA;  Surgeon: Radiologist, Medication, MD;  Location: [facility] OR;  Service: Radiology;  Laterality: N/A;   RADIOLOGY WITH ANESTHESIA N/A 11/04/2021   Procedure: ANGIOPLASTY/STENTING;  Surgeon: Julieanne Cotton, MD;  Location: [facility] OR;  Service: Radiology;  Laterality: N/A;   Social History   Occupational History   Occupation: Designer, television/film set    Comment: [employer]  Tobacco Use   Smoking status: Never   Smokeless tobacco: Former  Building services engineer status: Never Used  Substance and Sexual Activity   Alcohol use: Not Currently   Drug use: Not Currently    Types: Marijuana    Comment: ocassional maijuana gummie- last time- early April 2023   Sexual activity: Not on file

## 2023-09-22 NOTE — Progress Notes (Signed)
 Patient has had pain in the right elbow for a couple of months now. He says that he has been doing exercises to strengthen for lateral epicondylitis but those have not given him much relief. He says that when it feels really inflamed he will have some popping with pronation and supination. He does not have any weakness, but has decreased his load in the gym while trying to get some relief.

## 2023-12-30 ENCOUNTER — Encounter (HOSPITAL_COMMUNITY): Payer: Self-pay | Admitting: Interventional Radiology

## 2024-02-13 ENCOUNTER — Other Ambulatory Visit: Payer: Self-pay | Admitting: Sports Medicine

## 2024-02-13 MED ORDER — METHYLPREDNISOLONE 4 MG PO TBPK: ORAL_TABLET | ORAL | 0 refills | Status: DC

## 2024-02-14 ENCOUNTER — Ambulatory Visit: Admitting: Physical Medicine and Rehabilitation

## 2024-02-14 ENCOUNTER — Encounter: Payer: Self-pay | Admitting: Physical Medicine and Rehabilitation

## 2024-02-14 DIAGNOSIS — M5442 Lumbago with sciatica, left side: Secondary | ICD-10-CM

## 2024-02-14 DIAGNOSIS — M5416 Radiculopathy, lumbar region: Secondary | ICD-10-CM | POA: Diagnosis not present

## 2024-02-14 MED ORDER — PREGABALIN 75 MG PO CAPS: ORAL_CAPSULE | ORAL | 0 refills | Status: AC

## 2024-02-14 NOTE — Progress Notes (Signed)
 Pain Scale   Average Pain 5 Patient advised he was at gym and lifting weights when he started experiencing pain and has had the pain that not really having any relief.         +Driver, -BT, -Dye Allergies.

## 2024-02-14 NOTE — Progress Notes (Signed)
 GILBERT MANOLIS - 53 y.o. male MRN [id number]  Date of birth: [birth year]  Office Visit Note: Visit Date: 02/14/2024 PCP: Sophronia Ozell BROCKS, MD Referred by: Sophronia Ozell BROCKS, MD  Subjective: Chief Complaint  Patient presents with   Lower Back - Pain   HPI: Christopher Pugh is a 53 y.o. male who comes in today as a self referral for evaluation of acute onset of left sided lower back pain radiating to buttock and down posterior leg to foot. States he was performing snatch exercises with dumbbell at gym on Thursday evening. He immediately felt sharp sensation to left lower back and radiating down left posterior leg, he fell to ground due to pain. His pain did improve on Friday and Saturday, however returned on Sunday. He does appear to be uncomfortable during our visit today. His pain worsens with prolonged sitting. Feels as if his left leg is weak. He describes pain as sore, aching and shooting sensation, currently rates as 8 out of 10. Some relief of pain with home exercise regimen, rest and use of medications. My colleague Dr. Lonell Sprang prescribed Prednisone dose pack, also had IM injection of Toradol. Some relief of pain with these medications. Patient is fairly active and does exercise/weight train daily.  Patient denies numbness and tingling.  No recent falls.     Review of Systems  Musculoskeletal:  Positive for back pain.  Neurological:  Negative for tingling, sensory change, focal weakness and weakness.  All other systems reviewed and are negative.  Otherwise per HPI.  Assessment & Plan: Visit Diagnoses:    ICD-10-CM   1. Acute left-sided low back pain with left-sided sciatica  M54.42 MR LUMBAR SPINE WO CONTRAST    2. Radiculopathy, lumbar region  M54.16 MR LUMBAR SPINE WO CONTRAST       Plan: Findings:  Acute onset of left sided lower back pain radiating to buttock and down left posterior leg to foot. Patient continues to have severe pain despite good conservative therapies  such as home exercise regimen, rest and use of medications. Patients clinical presentation and exam are consistent with lumbar radiculopathy, more of S1 nerve pattern.  I do think his symptoms are most consistent with possible disc herniation.  We discussed treatment plan in detail today.  Dr. Eldonna also at bedside to discuss plan.  Next step is to place order for STAT lumbar MRI imaging.  We will try to get lumbar MRI done quickly and see him back as soon as possible.  Depending on results of lumbar MRI we did discuss the possibility of performing lumbar epidural steroid injection.  Also discussed medication management and prescribed Lyrica  for him to try, would like him to take at bedtime for 1 week and then increase to twice daily.  We also recommended gentle stretching and neural flossing exercises.  Would recommend avoiding Valsalva maneuvers and weightlifting at this time.  Patient has no questions at this time.  We will see him back for lumbar MRI review.  His exam today is nonfocal, good strength noted to bilateral lower extremities.  No red flag symptoms noted upon exam today.    Meds & Orders:  Meds ordered this encounter  Medications   pregabalin  (LYRICA ) 75 MG capsule    Sig: 1 tablet (75 mg) by mouth at bedtime for one week, then twice a day.    Dispense:  60 capsule    Refill:  0    Orders Placed This Encounter  Procedures  MR LUMBAR SPINE WO CONTRAST    Follow-up: Return for Lumbar MRI review.   Procedures: No procedures performed      Clinical History: Narrative & Impression CLINICAL DATA:  Low back pain radiating into the left leg for 1 week. Trauma.   EXAM: MRI LUMBAR SPINE WITHOUT CONTRAST   TECHNIQUE: Multiplanar, multisequence MR imaging of the lumbar spine was performed. No intravenous contrast was administered.   COMPARISON:  None Available.   FINDINGS: Segmentation: Conventional anatomy assumed, with the last open disc space designated L5-S1.    Alignment:  Straightening without focal angulation or listhesis.   Vertebrae: No worrisome osseous lesion, acute fracture or pars defect. The visualized sacroiliac joints appear unremarkable.   Conus medullaris: Extends to the T12-L1 level. The conus and cauda equina appear normal.   Paraspinal and other soft tissues: No significant paraspinal findings.   Disc levels:   Sagittal images demonstrate no significant disc space findings within the visualized lower thoracic spine.   L1-2: Normal interspace.   L2-3: Mild disc desiccation with mild loss of disc height and mild disc bulging eccentric to the left. Small extraforaminal annular fissure on the left without resulting spinal stenosis or nerve root impingement.   L3-4: Mild disc desiccation with mild loss of disc height, annular disc bulging and an extraforaminal annular fissure on the left. Mild facet and ligamentous hypertrophy. No spinal stenosis. Mild narrowing of the lateral recesses without definite impingement on the L3 or L4 nerve roots.   L4-5: Mild loss of disc height with annular disc bulging and a moderate-sized disc extrusion in the left subarticular zone. Mass effect on the thecal sac with probable encroachment on the left L5 and S1 nerve roots in the canal. Mild facet hypertrophy. No foraminal narrowing or exiting L4 nerve root impingement.   L5-S1: Normal interspace.   IMPRESSION: 1. Moderate-sized disc extrusion in the left subarticular zone at L4-5 with resulting mass effect on the thecal sac and probable encroachment on the left L5 and S1 nerve roots in the canal. 2. Mild disc bulging and facet hypertrophy at L2-3 and L3-4 without resulting significant spinal stenosis or nerve root impingement. 3. No acute osseous findings.     Electronically Signed   By: Elsie Perone M.D.   On: 02/16/2024 11:33   He reports that he has never smoked. He has quit using smokeless tobacco. No results for  input(s): HGBA1C, LABURIC in the last 8760 hours.  Objective:  VS:  HT:    WT:   BMI:     BP:   HR: bpm  TEMP: ( )  RESP:  Physical Exam Vitals and nursing note reviewed.  HENT:     Head: Normocephalic and atraumatic.     Right Ear: External ear normal.     Left Ear: External ear normal.     Nose: Nose normal.     Mouth/Throat:     Mouth: Mucous membranes are moist.  Eyes:     Extraocular Movements: Extraocular movements intact.  Cardiovascular:     Rate and Rhythm: Normal rate.     Pulses: Normal pulses.  Pulmonary:     Effort: Pulmonary effort is normal.  Abdominal:     General: Abdomen is flat. There is no distension.  Musculoskeletal:        General: Tenderness present.     Cervical back: Normal range of motion.     Comments: Patient rises from seated position to standing without difficulty. Good lumbar range of motion.  No pain noted with facet loading. 5/5 strength noted with bilateral hip flexion, knee flexion/extension, ankle dorsiflexion/plantarflexion and EHL. No clonus noted bilaterally. No pain upon palpation of greater trochanters. No pain with internal/external rotation of bilateral hips. Sensation intact bilaterally. Dysesthesias noted to left S1 dermatome. Positive slump test on the left. Ambulates without aid, gait steady.     Skin:    General: Skin is warm and dry.     Capillary Refill: Capillary refill takes less than 2 seconds.  Neurological:     General: No focal deficit present.     Mental Status: He is alert and oriented to person, place, and time.     Ortho Exam  Imaging: No results found.  Past Medical/Family/Surgical/Social History: Medications & Allergies reviewed per EMR, new medications updated. 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   TIA (transient ischemic attack) 10/30/2021   HLD (hyperlipidemia) 10/30/2021   Cerebrovascular accident (CVA) (HCC) 10/19/2021   BPH  without obstruction/lower urinary tract symptoms 10/19/2021   Hypogonadism in male 10/19/2021   Alcohol abuse 10/19/2021   C1 cervical fracture (HCC) 10/19/2021   Past Medical History:  Diagnosis Date   BPH without obstruction/lower urinary tract symptoms    Closed head injury with concussion    x 2   CVA (cerebral vascular accident) (HCC) 10/19/2021   DIzzy, off balance   Headache    Hypogonadism in male    Medical history non-contributory    Family History  Problem Relation Age of Onset   Ovarian cancer Mother    Hypertension Father    Stroke Paternal Aunt    Past Surgical History:  Procedure Laterality Date   ELBOW SURGERY Right 06/2021   IR ANGIO EXTRACRAN SEL COM CAROTID INNOMINATE UNI R MOD SED  05/04/2022   IR ANGIO INTRA EXTRACRAN SEL INTERNAL CAROTID UNI L MOD SED  05/04/2022   IR ANGIO VERTEBRAL SEL SUBCLAVIAN INNOMINATE UNI R MOD SED  05/04/2022   IR ANGIO VERTEBRAL SEL VERTEBRAL BILAT MOD SED  11/01/2021   IR ANGIO VERTEBRAL SEL VERTEBRAL UNI L MOD SED  05/04/2022   IR ANGIO VERTEBRAL SEL VERTEBRAL UNI R MOD SED  11/04/2021   IR ANGIOGRAM EXTREMITY RIGHT  11/04/2021   IR CT HEAD LTD  10/19/2021   IR INTRA CRAN STENT  11/04/2021   IR PERCUTANEOUS ART THROMBECTOMY/INFUSION INTRACRANIAL INC DIAG ANGIO  10/19/2021   IR US  GUIDE VASC ACCESS RIGHT  10/19/2021   IR US  GUIDE VASC ACCESS RIGHT  11/04/2021   IR US  GUIDE VASC ACCESS RIGHT  05/04/2022   RADIOLOGY WITH ANESTHESIA N/A 10/19/2021   Procedure: IR WITH ANESTHESIA;  Surgeon: Radiologist, Medication, MD;  Location: [facility] OR;  Service: Radiology;  Laterality: N/A;   RADIOLOGY WITH ANESTHESIA N/A 11/04/2021   Procedure: ANGIOPLASTY/STENTING;  Surgeon: Dolphus Carrion, MD;  Location: [facility] OR;  Service: Radiology;  Laterality: N/A;   Social History   Occupational History   Occupation: Designer, television/film set    Comment: [employer]  Tobacco Use   Smoking status: Never   Smokeless tobacco: Former  Haematologist status: Never Used  Substance and Sexual Activity   Alcohol use: Not Currently   Drug use: Not Currently    Types: Marijuana    Comment: ocassional maijuana gummie- last time- early April 2023   Sexual activity: Not on file

## 2024-02-16 ENCOUNTER — Ambulatory Visit
Admission: RE | Admit: 2024-02-16 | Discharge: 2024-02-16 | Disposition: A | Discharge status: Home / Self Care | Source: Ambulatory Visit | Attending: Physical Medicine and Rehabilitation | Admitting: Physical Medicine and Rehabilitation

## 2024-02-16 DIAGNOSIS — M5416 Radiculopathy, lumbar region: Secondary | ICD-10-CM

## 2024-02-16 DIAGNOSIS — M5442 Lumbago with sciatica, left side: Secondary | ICD-10-CM

## 2024-02-21 ENCOUNTER — Other Ambulatory Visit: Payer: Self-pay | Admitting: Physical Medicine and Rehabilitation

## 2024-02-21 DIAGNOSIS — M5116 Intervertebral disc disorders with radiculopathy, lumbar region: Secondary | ICD-10-CM

## 2024-02-21 DIAGNOSIS — M5416 Radiculopathy, lumbar region: Secondary | ICD-10-CM

## 2024-02-21 NOTE — Progress Notes (Signed)
 Spoke with patient this morning, burning and sharp radicular symptoms to left leg have somewhat resolved. He continues to experience feeling of weakness and numbness to left leg. Continues to have issues with sleeping. We are going to proceed with lumbar epidural steroid injection.

## 2024-02-28 ENCOUNTER — Other Ambulatory Visit: Payer: Self-pay

## 2024-02-28 ENCOUNTER — Ambulatory Visit: Admitting: Physical Medicine and Rehabilitation

## 2024-02-28 VITALS — BP 138/90 | HR 36

## 2024-02-28 DIAGNOSIS — M5416 Radiculopathy, lumbar region: Secondary | ICD-10-CM

## 2024-02-28 DIAGNOSIS — M5116 Intervertebral disc disorders with radiculopathy, lumbar region: Secondary | ICD-10-CM

## 2024-02-28 MED ORDER — METHYLPREDNISOLONE ACETATE 40 MG/ML IJ SUSP
40.0000 mg | Freq: Once | INTRAMUSCULAR | Status: AC
  Administered 2024-02-28: 40 mg

## 2024-02-28 NOTE — Progress Notes (Signed)
 Pain Scale   Average Pain 3 Patient advising he has lower back pain radiating to left leg. Pain is constant         +Driver, -BT, -Dye Allergies.

## 2024-02-28 NOTE — Progress Notes (Signed)
 Christopher Pugh - 53 y.o. male MRN [id number]  Date of birth: 1971-01-06  Office Visit Note: Visit Date: 02/28/2024 PCP: Sophronia Ozell BROCKS, MD Referred by: Sophronia Ozell BROCKS, MD  Subjective: Chief Complaint  Patient presents with   Lower Back - Pain   HPI:  Christopher Pugh is a 53 y.o. male who comes in today at the request of Duwaine Pouch, FNP for planned Left L5-S1 Lumbar Transforaminal epidural steroid injection with fluoroscopic guidance.  The patient has failed conservative care including home exercise, medications, time and activity modification.  This injection will be diagnostic and hopefully therapeutic.  Please see requesting physician notes for further details and justification.   ROS Otherwise per HPI.  Assessment & Plan: Visit Diagnoses:    ICD-10-CM   1. Radiculopathy, lumbar region  M54.16 XR C-ARM NO REPORT    Epidural Steroid injection    methylPREDNISolone  acetate (DEPO-MEDROL ) injection 40 mg    2. Intervertebral disc disorders with radiculopathy, lumbar region  M51.16 XR C-ARM NO REPORT    Epidural Steroid injection    methylPREDNISolone  acetate (DEPO-MEDROL ) injection 40 mg      Plan: No additional findings.   Meds & Orders:  Meds ordered this encounter  Medications   methylPREDNISolone  acetate (DEPO-MEDROL ) injection 40 mg    Orders Placed This Encounter  Procedures   XR C-ARM NO REPORT   Epidural Steroid injection    Follow-up: Return for visit to requesting provider as needed.   Procedures: No procedures performed  Lumbosacral Transforaminal Epidural Steroid Injection - Sub-Pedicular Approach with Fluoroscopic Guidance  Patient: Christopher Pugh      Date of Birth: 28-Dec-1970 MRN: [id number] PCP: Sophronia Ozell BROCKS, MD      Visit Date: 02/28/2024   Universal Protocol:    Date/Time: 02/28/2024  Consent Given By: the patient  Position: PRONE  Additional Comments: Vital signs were monitored before and after the procedure. Patient was  prepped and draped in the usual sterile fashion. The correct patient, procedure, and site was verified.   Injection Procedure Details:   Procedure diagnoses: Radiculopathy, lumbar region [M54.16]    Meds Administered:  Meds ordered this encounter  Medications   methylPREDNISolone  acetate (DEPO-MEDROL ) injection 40 mg    Laterality: Left  Location/Site: L5  Needle:5.0 in., 22 ga.  Short bevel or Quincke spinal needle  Needle Placement: Transforaminal  Findings:    -Comments: Excellent flow of contrast along the nerve, nerve root and into the epidural space.  Procedure Details: After squaring off the end-plates to get a true AP view, the C-arm was positioned so that an oblique view of the foramen as noted above was visualized. The target area is just inferior to the nose of the scotty dog or sub pedicular. The soft tissues overlying this structure were infiltrated with 2-3 ml. of 1% Lidocaine  without Epinephrine.  The spinal needle was inserted toward the target using a trajectory view along the fluoroscope beam.  Under AP and lateral visualization, the needle was advanced so it did not puncture dura and was located close the 6 O'Clock position of the pedical in AP tracterory. Biplanar projections were used to confirm position. Aspiration was confirmed to be negative for CSF and/or blood. A 1-2 ml. volume of Isovue-250 was injected and flow of contrast was noted at each level. Radiographs were obtained for documentation purposes.   After attaining the desired flow of contrast documented above, a 0.5 to 1.0 ml test dose of 0.25% Marcaine was injected into  each respective transforaminal space.  The patient was observed for 90 seconds post injection.  After no sensory deficits were reported, and normal lower extremity motor function was noted,   the above injectate was administered so that equal amounts of the injectate were placed at each foramen (level) into the transforaminal  epidural space.   Additional Comments:  The patient tolerated the procedure well Dressing: 2 x 2 sterile gauze and Band-Aid    Post-procedure details: Patient was observed during the procedure. Post-procedure instructions were reviewed.  Patient left the clinic in stable condition.    Clinical History: Narrative & Impression CLINICAL DATA:  Low back pain radiating into the left leg for 1 week. Trauma.   EXAM: MRI LUMBAR SPINE WITHOUT CONTRAST   TECHNIQUE: Multiplanar, multisequence MR imaging of the lumbar spine was performed. No intravenous contrast was administered.   COMPARISON:  None Available.   FINDINGS: Segmentation: Conventional anatomy assumed, with the last open disc space designated L5-S1.   Alignment:  Straightening without focal angulation or listhesis.   Vertebrae: No worrisome osseous lesion, acute fracture or pars defect. The visualized sacroiliac joints appear unremarkable.   Conus medullaris: Extends to the T12-L1 level. The conus and cauda equina appear normal.   Paraspinal and other soft tissues: No significant paraspinal findings.   Disc levels:   Sagittal images demonstrate no significant disc space findings within the visualized lower thoracic spine.   L1-2: Normal interspace.   L2-3: Mild disc desiccation with mild loss of disc height and mild disc bulging eccentric to the left. Small extraforaminal annular fissure on the left without resulting spinal stenosis or nerve root impingement.   L3-4: Mild disc desiccation with mild loss of disc height, annular disc bulging and an extraforaminal annular fissure on the left. Mild facet and ligamentous hypertrophy. No spinal stenosis. Mild narrowing of the lateral recesses without definite impingement on the L3 or L4 nerve roots.   L4-5: Mild loss of disc height with annular disc bulging and a moderate-sized disc extrusion in the left subarticular zone. Mass effect on the thecal sac with  probable encroachment on the left L5 and S1 nerve roots in the canal. Mild facet hypertrophy. No foraminal narrowing or exiting L4 nerve root impingement.   L5-S1: Normal interspace.   IMPRESSION: 1. Moderate-sized disc extrusion in the left subarticular zone at L4-5 with resulting mass effect on the thecal sac and probable encroachment on the left L5 and S1 nerve roots in the canal. 2. Mild disc bulging and facet hypertrophy at L2-3 and L3-4 without resulting significant spinal stenosis or nerve root impingement. 3. No acute osseous findings.     Electronically Signed   By: Elsie Perone M.D.   On: 02/16/2024 11:33     Objective:  VS:  HT:    WT:   BMI:     BP:(!) 138/90  HR:(!) 36bpm  TEMP: ( )  RESP:  Physical Exam Vitals and nursing note reviewed.  Constitutional:      General: He is not in acute distress.    Appearance: Normal appearance. He is not ill-appearing.  HENT:     Head: Normocephalic and atraumatic.     Right Ear: External ear normal.     Left Ear: External ear normal.     Nose: No congestion.  Eyes:     Extraocular Movements: Extraocular movements intact.  Cardiovascular:     Rate and Rhythm: Normal rate.     Pulses: Normal pulses.  Pulmonary:  Effort: Pulmonary effort is normal. No respiratory distress.  Abdominal:     General: There is no distension.     Palpations: Abdomen is soft.  Musculoskeletal:        General: No tenderness or signs of injury.     Cervical back: Neck supple.     Right lower leg: No edema.     Left lower leg: No edema.     Comments: Patient has good distal strength without clonus.  Skin:    Findings: No erythema or rash.  Neurological:     General: No focal deficit present.     Mental Status: He is alert and oriented to person, place, and time.     Sensory: No sensory deficit.     Motor: No weakness or abnormal muscle tone.     Coordination: Coordination normal.  Psychiatric:        Mood and Affect: Mood  normal.        Behavior: Behavior normal.      Imaging: No results found.

## 2024-02-28 NOTE — Procedures (Signed)
 Lumbosacral Transforaminal Epidural Steroid Injection - Sub-Pedicular Approach with Fluoroscopic Guidance  Patient: Christopher Pugh      Date of Birth: 10-Feb-1971 MRN: [id number] PCP: Sophronia Ozell BROCKS, MD      Visit Date: 02/28/2024   Universal Protocol:    Date/Time: 02/28/2024  Consent Given By: the patient  Position: PRONE  Additional Comments: Vital signs were monitored before and after the procedure. Patient was prepped and draped in the usual sterile fashion. The correct patient, procedure, and site was verified.   Injection Procedure Details:   Procedure diagnoses: Radiculopathy, lumbar region [M54.16]    Meds Administered:  Meds ordered this encounter  Medications   methylPREDNISolone  acetate (DEPO-MEDROL ) injection 40 mg    Laterality: Left  Location/Site: L5  Needle:5.0 in., 22 ga.  Short bevel or Quincke spinal needle  Needle Placement: Transforaminal  Findings:    -Comments: Excellent flow of contrast along the nerve, nerve root and into the epidural space.  Procedure Details: After squaring off the end-plates to get a true AP view, the C-arm was positioned so that an oblique view of the foramen as noted above was visualized. The target area is just inferior to the nose of the scotty dog or sub pedicular. The soft tissues overlying this structure were infiltrated with 2-3 ml. of 1% Lidocaine  without Epinephrine.  The spinal needle was inserted toward the target using a trajectory view along the fluoroscope beam.  Under AP and lateral visualization, the needle was advanced so it did not puncture dura and was located close the 6 O'Clock position of the pedical in AP tracterory. Biplanar projections were used to confirm position. Aspiration was confirmed to be negative for CSF and/or blood. A 1-2 ml. volume of Isovue-250 was injected and flow of contrast was noted at each level. Radiographs were obtained for documentation purposes.   After attaining the  desired flow of contrast documented above, a 0.5 to 1.0 ml test dose of 0.25% Marcaine was injected into each respective transforaminal space.  The patient was observed for 90 seconds post injection.  After no sensory deficits were reported, and normal lower extremity motor function was noted,   the above injectate was administered so that equal amounts of the injectate were placed at each foramen (level) into the transforaminal epidural space.   Additional Comments:  The patient tolerated the procedure well Dressing: 2 x 2 sterile gauze and Band-Aid    Post-procedure details: Patient was observed during the procedure. Post-procedure instructions were reviewed.  Patient left the clinic in stable condition.

## 2024-05-07 ENCOUNTER — Encounter: Payer: Self-pay | Admitting: Radiology
